# Patient Record
Sex: Male | Born: 1935
Health system: Southern US, Community
[De-identification: ages and names within clinical notes are randomized; demographics above are authoritative.]

## PROBLEM LIST (undated history)

## (undated) DIAGNOSIS — N400 Enlarged prostate without lower urinary tract symptoms: Secondary | ICD-10-CM

## (undated) DIAGNOSIS — F064 Anxiety disorder due to known physiological condition: Secondary | ICD-10-CM

## (undated) DIAGNOSIS — K219 Gastro-esophageal reflux disease without esophagitis: Secondary | ICD-10-CM

## (undated) DIAGNOSIS — E7800 Pure hypercholesterolemia, unspecified: Secondary | ICD-10-CM

## (undated) DIAGNOSIS — I10 Essential (primary) hypertension: Secondary | ICD-10-CM

## (undated) DIAGNOSIS — I209 Angina pectoris, unspecified: Secondary | ICD-10-CM

## (undated) DIAGNOSIS — M1990 Unspecified osteoarthritis, unspecified site: Secondary | ICD-10-CM

## (undated) DIAGNOSIS — F4321 Adjustment disorder with depressed mood: Secondary | ICD-10-CM

## (undated) DIAGNOSIS — Z634 Disappearance and death of family member: Secondary | ICD-10-CM

---

## 2010-02-22 ENCOUNTER — Emergency Department: Payer: Self-pay | Admitting: Emergency Medicine

## 2012-11-02 ENCOUNTER — Emergency Department: Payer: Self-pay | Admitting: Emergency Medicine

## 2012-11-02 LAB — URINALYSIS, COMPLETE
Blood: NEGATIVE
Glucose,UR: NEGATIVE mg/dL (ref 0–75)
Leukocyte Esterase: NEGATIVE
Nitrite: NEGATIVE
Ph: 5 (ref 4.5–8.0)
Squamous Epithelial: <1

## 2012-11-02 LAB — COMPREHENSIVE METABOLIC PANEL
Albumin: 4.2 g/dL (ref 3.4–5.0)
Alkaline Phosphatase: 86 U/L (ref 50–136)
Bilirubin,Total: 0.7 mg/dL (ref 0.2–1.0)
Calcium, Total: 9.4 mg/dL (ref 8.5–10.1)
Chloride: 102 mmol/L (ref 98–107)
Creatinine: 0.74 mg/dL (ref 0.60–1.30)
Glucose: 102 mg/dL — ABNORMAL HIGH (ref 65–99)
Osmolality: 273 (ref 275–301)
SGOT(AST): 26 U/L (ref 15–37)
SGPT (ALT): 24 U/L (ref 12–78)
Total Protein: 7.2 g/dL (ref 6.4–8.2)

## 2012-11-02 LAB — DRUG SCREEN, URINE
Barbiturates, Ur Screen: NEGATIVE (ref ?–200)
Cannabinoid 50 Ng, Ur ~~LOC~~: NEGATIVE (ref ?–50)
Cocaine Metabolite,Ur ~~LOC~~: NEGATIVE (ref ?–300)
MDMA (Ecstasy)Ur Screen: NEGATIVE (ref ?–500)
Opiate, Ur Screen: NEGATIVE (ref ?–300)

## 2012-11-02 LAB — TSH: Thyroid Stimulating Horm: 2.2 u[IU]/mL

## 2012-11-02 LAB — CBC
MCH: 33.1 pg (ref 26.0–34.0)
MCHC: 35.9 g/dL (ref 32.0–36.0)
Platelet: 269 10*3/uL (ref 150–440)
WBC: 7.9 10*3/uL (ref 3.8–10.6)

## 2012-11-02 LAB — ETHANOL: Ethanol: <3 mg/dL

## 2014-05-16 DIAGNOSIS — I10 Essential (primary) hypertension: Secondary | ICD-10-CM | POA: Diagnosis not present

## 2014-05-16 DIAGNOSIS — G4700 Insomnia, unspecified: Secondary | ICD-10-CM | POA: Diagnosis not present

## 2014-05-16 DIAGNOSIS — E785 Hyperlipidemia, unspecified: Secondary | ICD-10-CM | POA: Diagnosis not present

## 2014-05-16 DIAGNOSIS — K219 Gastro-esophageal reflux disease without esophagitis: Secondary | ICD-10-CM | POA: Diagnosis not present

## 2014-05-16 DIAGNOSIS — N401 Enlarged prostate with lower urinary tract symptoms: Secondary | ICD-10-CM | POA: Diagnosis not present

## 2014-06-20 DIAGNOSIS — Z8679 Personal history of other diseases of the circulatory system: Secondary | ICD-10-CM | POA: Diagnosis not present

## 2014-06-20 DIAGNOSIS — K219 Gastro-esophageal reflux disease without esophagitis: Secondary | ICD-10-CM | POA: Diagnosis not present

## 2014-06-20 DIAGNOSIS — N401 Enlarged prostate with lower urinary tract symptoms: Secondary | ICD-10-CM | POA: Diagnosis not present

## 2014-06-20 DIAGNOSIS — I10 Essential (primary) hypertension: Secondary | ICD-10-CM | POA: Diagnosis not present

## 2014-06-20 DIAGNOSIS — I519 Heart disease, unspecified: Secondary | ICD-10-CM | POA: Diagnosis not present

## 2014-06-23 NOTE — Consult Note (Signed)
PATIENT NAME:  Erik Palmer, Erik Palmer MR#:  [id number] DATE OF BIRTH:  27-Dec-1934  DATE OF CONSULTATION:  11/02/2012  PSYCHIATRY CONSULT  REQUESTING PHYSICIAN:  Lowella Fairy, MD CONSULTING PHYSICIAN:  Ardeen Fillers. Garnetta Buddy, MD  REASON FOR CONSULT:  Medication evaluation.  HISTORY OF PRESENT ILLNESS:  The patient is a 80 year old white male who presented to the ER accompanied by his wife as he was complaining of having a feeling in his ear which was bothering him. He told the nurse that he felt suicidal and if anything felt like this, he is not going to live anymore. The patient was evaluated in the room and his friend was sitting next to him. He reported that he had a procedure done in his ear, as his ears were full and the ear canal was not working properly.  He reported that he received a steroid shot in his ear canal in late July. He reported that since then he started having fullness in his ear, as well as in the upper part of his nose. He cannot feel comfortable. The patient reported that his right ear is plugged up.  He cannot sleep and was feeling very restless. He reported that because of the ear discomfort when he came to the ER this morning, he was thinking about getting a CT scan to rule out any other medical issues. However, when the nurse was talking to him, he just mentioned briefly that he does not want to live like this anymore. He reported that he does not mean that he is going to hurt himself. He reported that he has been having a good relationship with his wife, as well as with his friend. He reported that he spent most of the time at home doing some gardening, reading, as well as hunting turkeys. His friend who was sitting next to him reported that he enjoys life to the fullest. The patient reported that he has been married for the past 52 years and has been living with his wife. He reported that he does not feel depressed or anxious. However, he has some sadness related to the death of his son who  passed away 2 years ago after he went into septic shock by eating raw vegetables. The patient reported that [place] patient [place] living and there are things around the house which reminds of his deceased son. However, he is trying to deal with it in his own way but is not having any thoughts to hurt himself or his family.   PAST PSYCHIATRIC HISTORY:  The patient reported that he does not have any psychiatric illness and has never seen a psychiatrist in the past. He reported that he feels fine and is not taking any psychotropic medication. He only takes Ambien as needed to help him with sleep.   PAST MEDICAL HISTORY: The patient has been diagnosed with hypertension, GERD, BPH, hypercholesterolemia, arthritis.  CURRENT HOME MEDICATIONS:  1.  Metoprolol 50 mg 1/2 pill twice daily. 2.  Omeprazole 20 mg daily. 3.  Tamsulosin 0.4 mg once a day. 4.  Fluticasone nasal spray once daily. 5.  Atorvastatin 80 mg once daily.  6.  Aspirin low-dose 81 mg once daily. 7.  Tylenol arthritis 650 mg 2 times a day as needed. 8.  Percocet 1 to 2 tabs every 6 hours p.r.n. as needed for pain. 9.  Ambien 10 mg as needed for sleep. 10.  Hydrochlorothiazide 25 mg 1 tablet as needed  in the morning.   ALLERGIES:  No known drug allergies.   SOCIAL HISTORY:  The patient currently is married and lives with his wife. He has 3 children and 1 is currently deceased. 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  He stated that he [facility] least once per year. However, he feels like that they are  doing a good job and he does not want to switch his current providers.  ANCILLARY DATA: Pulse 75, respirations 20, blood pressure 186/89.  LABORATORY DATA:  Glucose 102, BUN 15, creatinine 0.74, sodium 136, potassium 3.2, chloride 102, bicarbonate 26, anion gap 8, osmolality 273. Blood alcohol  level less than 3, protein 7.2, albumin 4.2, bilirubin 0.7, alkaline phosphatase 86, AST 26, ALT 24. Urine drug screen is negative. WBC 7.9, RBC 4.13, hemoglobin 13.7, hematocrit 38.1, platelet count 269 .  REVIEW OF SYSTEMS:  Currently denied having any weight loss.  EYES:  No double or blurred vision.  ENT:  Complaining of fullness in the right ear. GASTROINTESTINAL:  No abdominal pain, nausea, vomiting.  GENITOURINARY:  No incontinence or frequency.  MUSCULOSKELETAL:  No muscle or joint pain.  MENTAL STATUS:  The patient is a moderately-built male who was lying in the bed. He appeared calm, pleasant and cooperative. Speech was normal in tone and volume. Mood was fine. Affect was congruent. Thought process was logical, goal-directed. Thought content was nondelusional. He currently denied having any suicidal or homicidal ideations or plans. He demonstrated fair insight and judgment. No memory deficits noted at this time and appeared age-related.   DIAGNOSTIC IMPRESSION: AXIS I:  Depressive disorder, not otherwise specified.   AXIS II:  None.   AXIS III:  Please review the medical history.   TREATMENT PLAN: 1.  I discussed with patient at length about the medications, treatment, risks, benefits and alternatives.  2.  He will continue following up [facility] and no new medications will be prescribed at this time. I discussed this case with Dr. Margarita GrizzleWoodruff and he agreed with the plan that the patient can be discharged safely from the hospital at this time. The patient denied having any suicidal thoughts.  Thank you for allowing me to participate in the care of this patient.    ____________________________ Ardeen FillersUzma S. Garnetta BuddyFaheem, MD usf:ce D: 11/02/2012 15:12:04 ET T: 11/02/2012 15:50:36 ET JOB#: [id number]  cc: Ardeen FillersUzma S. Garnetta BuddyFaheem, MD, <Dictator> Rhunette CroftUZMA S Thomasine Klutts MD ELECTRONICALLY SIGNED 11/04/2012 13:55

## 2014-08-22 ENCOUNTER — Other Ambulatory Visit: Payer: Self-pay | Admitting: Family Medicine

## 2014-08-30 DIAGNOSIS — F329 Major depressive disorder, single episode, unspecified: Secondary | ICD-10-CM | POA: Diagnosis not present

## 2014-08-30 DIAGNOSIS — I10 Essential (primary) hypertension: Secondary | ICD-10-CM | POA: Diagnosis not present

## 2014-08-30 DIAGNOSIS — R531 Weakness: Secondary | ICD-10-CM | POA: Diagnosis not present

## 2014-08-31 DIAGNOSIS — F419 Anxiety disorder, unspecified: Secondary | ICD-10-CM | POA: Diagnosis not present

## 2014-08-31 DIAGNOSIS — F329 Major depressive disorder, single episode, unspecified: Secondary | ICD-10-CM | POA: Diagnosis not present

## 2014-09-01 ENCOUNTER — Telehealth: Payer: Self-pay | Admitting: Family Medicine

## 2014-09-01 NOTE — Telephone Encounter (Signed)
Wife says she has Prozac from 2 years ago. I advised not to take as it is expired and it is not good to take expired meds. She will have him stop Celexa for now and may call and schedule appt later.

## 2014-09-01 NOTE — Telephone Encounter (Signed)
He can stop the Celexa if he wants, or he can try 1/2 tablet to start.  i cannot give him Prozac again unless I see him and I will not be able to see him for 2 weeks.  He had not taken Prozac as he was supposed to in the past according to my notes.  He could see Amy next week if he wanted.-jh

## 2014-09-01 NOTE — Telephone Encounter (Signed)
Pt. Wife called states that pt urgent  Care  (I think 08/2914) was given Celexa have taken only 3 pills, pills can not sleep,eat wanted  To know if he should stop this pill, or take  Prozac for depression pt call back # is [phone](h), [phone](c).

## 2014-09-02 ENCOUNTER — Encounter: Payer: Self-pay | Admitting: Emergency Medicine

## 2014-09-02 ENCOUNTER — Inpatient Hospital Stay
Admission: EM | Admit: 2014-09-02 | Discharge: 2014-09-04 | DRG: 641 | Disposition: A | Discharge status: Home / Self Care | Payer: Medicare Other | Attending: Specialist | Admitting: Specialist

## 2014-09-02 ENCOUNTER — Emergency Department: Payer: Medicare Other

## 2014-09-02 DIAGNOSIS — M1990 Unspecified osteoarthritis, unspecified site: Secondary | ICD-10-CM | POA: Diagnosis present

## 2014-09-02 DIAGNOSIS — Z79899 Other long term (current) drug therapy: Secondary | ICD-10-CM | POA: Diagnosis not present

## 2014-09-02 DIAGNOSIS — F419 Anxiety disorder, unspecified: Secondary | ICD-10-CM | POA: Diagnosis not present

## 2014-09-02 DIAGNOSIS — I209 Angina pectoris, unspecified: Secondary | ICD-10-CM | POA: Diagnosis not present

## 2014-09-02 DIAGNOSIS — E785 Hyperlipidemia, unspecified: Secondary | ICD-10-CM | POA: Diagnosis not present

## 2014-09-02 DIAGNOSIS — F064 Anxiety disorder due to known physiological condition: Secondary | ICD-10-CM | POA: Diagnosis present

## 2014-09-02 DIAGNOSIS — E871 Hypo-osmolality and hyponatremia: Principal | ICD-10-CM | POA: Diagnosis present

## 2014-09-02 DIAGNOSIS — E780 Pure hypercholesterolemia: Secondary | ICD-10-CM | POA: Diagnosis present

## 2014-09-02 DIAGNOSIS — N400 Enlarged prostate without lower urinary tract symptoms: Secondary | ICD-10-CM | POA: Diagnosis not present

## 2014-09-02 DIAGNOSIS — I10 Essential (primary) hypertension: Secondary | ICD-10-CM | POA: Diagnosis present

## 2014-09-02 DIAGNOSIS — J984 Other disorders of lung: Secondary | ICD-10-CM | POA: Diagnosis not present

## 2014-09-02 DIAGNOSIS — Z7982 Long term (current) use of aspirin: Secondary | ICD-10-CM | POA: Diagnosis not present

## 2014-09-02 DIAGNOSIS — Z634 Disappearance and death of family member: Secondary | ICD-10-CM | POA: Diagnosis present

## 2014-09-02 DIAGNOSIS — G4700 Insomnia, unspecified: Secondary | ICD-10-CM | POA: Diagnosis present

## 2014-09-02 DIAGNOSIS — K219 Gastro-esophageal reflux disease without esophagitis: Secondary | ICD-10-CM | POA: Diagnosis present

## 2014-09-02 DIAGNOSIS — R110 Nausea: Secondary | ICD-10-CM | POA: Diagnosis not present

## 2014-09-02 DIAGNOSIS — R51 Headache: Secondary | ICD-10-CM | POA: Diagnosis present

## 2014-09-02 DIAGNOSIS — F4321 Adjustment disorder with depressed mood: Secondary | ICD-10-CM | POA: Diagnosis present

## 2014-09-02 HISTORY — DX: Benign prostatic hyperplasia without lower urinary tract symptoms: N40.0

## 2014-09-02 HISTORY — DX: Adjustment disorder with depressed mood: F43.21

## 2014-09-02 HISTORY — DX: Disappearance and death of family member: Z63.4

## 2014-09-02 HISTORY — DX: Essential (primary) hypertension: I10

## 2014-09-02 HISTORY — DX: Gastro-esophageal reflux disease without esophagitis: K21.9

## 2014-09-02 HISTORY — DX: Unspecified osteoarthritis, unspecified site: M19.90

## 2014-09-02 HISTORY — DX: Pure hypercholesterolemia, unspecified: E78.00

## 2014-09-02 HISTORY — DX: Angina pectoris, unspecified: I20.9

## 2014-09-02 HISTORY — DX: Anxiety disorder due to known physiological condition: F06.4

## 2014-09-02 LAB — CBC
HCT: 39.4 % — ABNORMAL LOW (ref 40.0–52.0)
HEMOGLOBIN: 14.2 g/dL (ref 13.0–18.0)
MCH: 32.8 pg (ref 26.0–34.0)
MCHC: 36.1 g/dL — ABNORMAL HIGH (ref 32.0–36.0)
MCV: 90.9 fL (ref 80.0–100.0)
Platelets: 315 10*3/uL (ref 150–440)
RBC: 4.33 MIL/uL — AB (ref 4.40–5.90)
RDW: 12.9 % (ref 11.5–14.5)
WBC: 10.6 10*3/uL (ref 3.8–10.6)

## 2014-09-02 LAB — BASIC METABOLIC PANEL
Anion gap: 13 (ref 5–15)
BUN: 8 mg/dL (ref 6–20)
CHLORIDE: 87 mmol/L — AB (ref 101–111)
CO2: 21 mmol/L — ABNORMAL LOW (ref 22–32)
CREATININE: 0.58 mg/dL — AB (ref 0.61–1.24)
Calcium: 8.8 mg/dL — ABNORMAL LOW (ref 8.9–10.3)
GFR calc Af Amer: >60 mL/min (ref >60)
GLUCOSE: 118 mg/dL — AB (ref 65–99)
POTASSIUM: 3.3 mmol/L — AB (ref 3.5–5.1)
Sodium: 121 mmol/L — ABNORMAL LOW (ref 135–145)

## 2014-09-02 LAB — TSH: TSH: 1.509 u[IU]/mL (ref 0.350–4.500)

## 2014-09-02 LAB — HEPATIC FUNCTION PANEL
ALK PHOS: 69 U/L (ref 38–126)
ALT: 20 U/L (ref 17–63)
AST: 25 U/L (ref 15–41)
Albumin: 4.5 g/dL (ref 3.5–5.0)
BILIRUBIN DIRECT: 0.1 mg/dL (ref 0.1–0.5)
BILIRUBIN INDIRECT: 1 mg/dL — AB (ref 0.3–0.9)
TOTAL PROTEIN: 6.9 g/dL (ref 6.5–8.1)
Total Bilirubin: 1.1 mg/dL (ref 0.3–1.2)

## 2014-09-02 LAB — TROPONIN I: Troponin I: <0.03 ng/mL (ref <0.031)

## 2014-09-02 MED ORDER — ACETAMINOPHEN 325 MG PO TABS
650.0000 mg | ORAL_TABLET | Freq: Four times a day (QID) | ORAL | Status: DC | PRN
  Administered 2014-09-02 – 2014-09-03 (×2): 650 mg via ORAL
  Filled 2014-09-02 (×2): qty 2

## 2014-09-02 MED ORDER — ATORVASTATIN CALCIUM 20 MG PO TABS
40.0000 mg | ORAL_TABLET | ORAL | Status: DC
  Administered 2014-09-03 – 2014-09-04 (×2): 40 mg via ORAL
  Filled 2014-09-02 (×2): qty 2

## 2014-09-02 MED ORDER — ACETAMINOPHEN 650 MG RE SUPP: 650.0000 mg | Freq: Four times a day (QID) | RECTAL | Status: DC | PRN

## 2014-09-02 MED ORDER — ONDANSETRON HCL 4 MG PO TABS: 4.0000 mg | ORAL_TABLET | Freq: Four times a day (QID) | ORAL | Status: DC | PRN

## 2014-09-02 MED ORDER — ONDANSETRON HCL 4 MG/2ML IJ SOLN: 4.0000 mg | Freq: Four times a day (QID) | INTRAMUSCULAR | Status: DC | PRN

## 2014-09-02 MED ORDER — FLUTICASONE PROPIONATE 50 MCG/ACT NA SUSP
1.0000 | Freq: Every day | NASAL | Status: DC
  Administered 2014-09-03 – 2014-09-04 (×2): 1 via NASAL
  Filled 2014-09-02: qty 16

## 2014-09-02 MED ORDER — HEPARIN SODIUM (PORCINE) 5000 UNIT/ML IJ SOLN
5000.0000 [IU] | Freq: Three times a day (TID) | INTRAMUSCULAR | Status: DC
  Filled 2014-09-02 (×3): qty 1

## 2014-09-02 MED ORDER — TAMSULOSIN HCL 0.4 MG PO CAPS
0.4000 mg | ORAL_CAPSULE | Freq: Every evening | ORAL | Status: DC
  Administered 2014-09-03: 0.4 mg via ORAL
  Filled 2014-09-02: qty 1

## 2014-09-02 MED ORDER — ZOLPIDEM TARTRATE 5 MG PO TABS
5.0000 mg | ORAL_TABLET | Freq: Every evening | ORAL | Status: DC | PRN
  Administered 2014-09-02 – 2014-09-03 (×2): 5 mg via ORAL
  Filled 2014-09-02 (×2): qty 1

## 2014-09-02 MED ORDER — ASPIRIN EC 81 MG PO TBEC
81.0000 mg | DELAYED_RELEASE_TABLET | ORAL | Status: DC
  Administered 2014-09-03 – 2014-09-04 (×2): 81 mg via ORAL
  Filled 2014-09-02 (×2): qty 1

## 2014-09-02 MED ORDER — METOPROLOL TARTRATE 25 MG PO TABS
25.0000 mg | ORAL_TABLET | Freq: Two times a day (BID) | ORAL | Status: DC
  Administered 2014-09-02 – 2014-09-04 (×3): 25 mg via ORAL
  Filled 2014-09-02 (×4): qty 1

## 2014-09-02 MED ORDER — LORAZEPAM 0.5 MG PO TABS
ORAL_TABLET | ORAL | Status: AC
  Administered 2014-09-02: 0.5 mg via ORAL
  Filled 2014-09-02: qty 1

## 2014-09-02 MED ORDER — MORPHINE SULFATE 2 MG/ML IJ SOLN
2.0000 mg | INTRAMUSCULAR | Status: DC | PRN
  Administered 2014-09-03: 2 mg via INTRAVENOUS
  Filled 2014-09-02: qty 1

## 2014-09-02 MED ORDER — PANTOPRAZOLE SODIUM 40 MG PO TBEC
40.0000 mg | DELAYED_RELEASE_TABLET | Freq: Every day | ORAL | Status: DC
  Administered 2014-09-03 – 2014-09-04 (×2): 40 mg via ORAL
  Filled 2014-09-02 (×2): qty 1

## 2014-09-02 MED ORDER — LORAZEPAM 0.5 MG PO TABS
0.5000 mg | ORAL_TABLET | Freq: Once | ORAL | Status: AC
  Administered 2014-09-02: 0.5 mg via ORAL

## 2014-09-02 MED ORDER — SODIUM CHLORIDE 0.9 % IV BOLUS (SEPSIS)
1000.0000 mL | Freq: Once | INTRAVENOUS | Status: AC
  Administered 2014-09-02: 1000 mL via INTRAVENOUS

## 2014-09-02 MED ORDER — SODIUM CHLORIDE 0.9 % IV SOLN
INTRAVENOUS | Status: DC
  Administered 2014-09-02 – 2014-09-04 (×4): via INTRAVENOUS

## 2014-09-02 NOTE — H&P (Signed)
[facility] [place] [facility]   PATIENT NAME: Erik Palmer    MR#:  [id number]  DATE OF BIRTH:  [birth year]   DATE OF ADMISSION:  09/02/2014  PRIMARY CARE PHYSICIAN: Fidel Levy, MD   REQUESTING/REFERRING PHYSICIAN: Shaune Pollack  CHIEF COMPLAINT:   Chief Complaint  Patient presents with  . Headache    Pt presents to ER alert and in NAD. Pt reports he has had headache, abd pain, anxiety, nasuea, insomnia for 2 weeks.   . Abdominal Pain  . Anxiety    HISTORY OF PRESENT ILLNESS:  Erik Palmer  is a 80 y.o. male with a known history of essential hypertension, gastroesophageal reflux disease without esophagitis presenting with anxiety. History aided from family members present at bedside. Patient states approximately 7 days ago began feeling anxious as well as insomnia, decreased by mouth intake condition worsened for the last 3 days. He did attempt to take some Celexa at home without any improvement and self discontinued this medication. [name] presents to Hospital further workup and evaluation. Emergency room course: Noted to have markedly low sodium levels 121  PAST MEDICAL HISTORY:   Past Medical History  Diagnosis Date  . Hypertension   . Prostate enlargement   . GERD (gastroesophageal reflux disease)   . Angina pectoris syndrome   . Hypercholesteremia   . Arthritis     PAST SURGICAL HISTORY:  History reviewed. No pertinent past surgical history.  SOCIAL HISTORY:   History  Substance Use Topics  . Smoking status: Never Smoker   . Smokeless tobacco: Not on file  . Alcohol Use: No    FAMILY HISTORY:   Family History  Problem Relation Age of Onset  . Diabetes Neg Hx     DRUG ALLERGIES:  No Known Allergies  REVIEW OF SYSTEMS:  REVIEW OF SYSTEMS:  CONSTITUTIONAL: Denies fevers, chills, fatigue, weakness.  EYES: Denies blurred vision, double vision, or eye pain.  EARS, NOSE, THROAT: Denies tinnitus, ear pain, hearing loss.   RESPIRATORY: denies cough, shortness of breath, wheezing  CARDIOVASCULAR: Denies chest pain, palpitations, edema.  GASTROINTESTINAL: Positive nausea, denies vomiting, diarrhea, abdominal pain.  GENITOURINARY: Denies dysuria, hematuria.  ENDOCRINE: Denies nocturia or thyroid problems. HEMATOLOGIC AND LYMPHATIC: Denies easy bruising or bleeding.  SKIN: Denies rash or lesions.  MUSCULOSKELETAL: Denies pain in neck, back, shoulder, knees, hips, or further arthritic symptoms.  NEUROLOGIC: Denies paralysis, paresthesias.  PSYCHIATRIC: Positive anxiety or denies depressive symptoms. Otherwise full review of systems performed by me is negative.   MEDICATIONS AT HOME:   Prior to Admission medications   Medication Sig Start Date End Date Taking? Authorizing Provider  acetaminophen (TYLENOL) 650 MG CR tablet Take 650 mg by mouth daily.   Yes Historical Provider, MD  aspirin EC 81 MG tablet Take 81 mg by mouth every morning.   Yes Historical Provider, MD  atorvastatin (LIPITOR) 80 MG tablet Take 40 mg by mouth every morning. 08/18/14  Yes Historical Provider, MD  fluticasone (FLONASE) 50 MCG/ACT nasal spray INHALE 2 SPRAYS INTO EACH NOSTRIL EVERY DAY 08/22/14  Yes Janeann Forehand., MD  hydrochlorothiazide (HYDRODIURIL) 25 MG tablet Take 12.5 mg by mouth daily. 08/18/14  Yes Historical Provider, MD  metoprolol (LOPRESSOR) 50 MG tablet Take 25 mg by mouth 2 (two) times daily. 08/09/14  Yes Historical Provider, MD  omeprazole (PRILOSEC) 20 MG capsule Take 20 mg by mouth every morning. 08/18/14  Yes Historical Provider, MD  tamsulosin (FLOMAX) 0.4 MG CAPS capsule Take  0.4 mg by mouth every evening. 08/28/14  Yes Historical Provider, MD      VITAL SIGNS:  Blood pressure 129/61, pulse 76, temperature 97.9 F (36.6 C), temperature source Oral, resp. rate 18, height  (1.753 m), weight 218 lb (98.884 kg), SpO2 97 %.  PHYSICAL EXAMINATION:  VITAL SIGNS: Filed Vitals:   09/02/14 2228  BP: 129/61   Pulse: 76  Temp:   Resp: 18   GENERAL:79 y.o.male currently in no acute distress.  HEAD: Normocephalic, atraumatic.  EYES: Pupils equal, round, reactive to light. Extraocular muscles intact. No scleral icterus.  MOUTH: Dry mucosal membrane. Dentition intact. No abscess noted.  EAR, NOSE, THROAT: Clear without exudates. No external lesions.  NECK: Supple. No thyromegaly. No nodules. No JVD.  PULMONARY: Clear to ascultation, without wheeze rails or rhonci. No use of accessory muscles, Good respiratory effort. good air entry bilaterally CHEST: Nontender to palpation.  CARDIOVASCULAR: S1 and S2. Regular rate and rhythm. No murmurs, rubs, or gallops. No edema. Pedal pulses 2+ bilaterally.  GASTROINTESTINAL: Soft, nontender, nondistended. No masses. Positive bowel sounds. No hepatosplenomegaly.  MUSCULOSKELETAL: No swelling, clubbing, or edema. Range of motion full in all extremities.  NEUROLOGIC: Cranial nerves II through XII are intact. No gross focal neurological deficits. Sensation intact. Reflexes intact.  SKIN: No ulceration, lesions, rashes, or cyanosis. Skin warm and dry. Turgor intact.  PSYCHIATRIC: Mood, affect within normal limits. The patient is awake, alert and oriented x 3. Insight, judgment intact.    LABORATORY PANEL:   CBC  Recent Labs Lab 09/02/14 1923  WBC 10.6  HGB 14.2  HCT 39.4*  PLT 315   ------------------------------------------------------------------------------------------------------------------  Chemistries   Recent Labs Lab 09/02/14 1923  NA 121*  K 3.3*  CL 87*  CO2 21*  GLUCOSE 118*  BUN 8  CREATININE 0.58*  CALCIUM 8.8*  AST 25  ALT 20  ALKPHOS 69  BILITOT 1.1   ------------------------------------------------------------------------------------------------------------------  Cardiac Enzymes  Recent Labs Lab 09/02/14 1923  TROPONINI <0.03    ------------------------------------------------------------------------------------------------------------------  RADIOLOGY:  Dg Abd Acute W/chest  09/02/2014   CLINICAL DATA:  Weakness, several weeks duration. Insomnia. Appetite loss. Headache and nausea.  EXAM: DG ABDOMEN ACUTE W/ 1V CHEST  COMPARISON:  None.  FINDINGS: Bowel gas pattern is normal without evidence of ileus, obstruction or free air. Fecal volume is within normal limits. No abnormal calcifications or significant bony findings.  One-view chest shows normal heart size. There is atherosclerosis of the aorta. There is some pleural scarring bilaterally. There are increased interstitial markings in the lower lungs, probably scarring. No sign of infiltrate, mass, collapse or effusion.  IMPRESSION: Negative for acute findings. No pathologic finding in the abdomen. Pleural and parenchymal scarring in the chest.   Electronically Signed   By: Paulina Fusi M.D.   On: 09/02/2014 20:55    EKG:   Orders placed or performed during the hospital encounter of 09/02/14  . ED EKG  . ED EKG  . EKG 12-Lead  . EKG 12-Lead    IMPRESSION AND PLAN:   80 year old Caucasian gentleman history of essential hypertension, GERD without esophagitis presenting with anxiety symptoms noted to have low sodium.  1. Hyponatremia: Patient describes poor by mouth intake as well as on a diuretic, sodium deficit 938.6 to correct to sodium 140, to correct sodium to 131-494 mEq deficit, provide IV fluid hydration normal saline 100 mL per hour follow sodium levels, hold diuretics, check TSH  2. Anxiety not otherwise specified: Consult psychiatry 3. Essential hypertension:  Hold hydrochlorothiazide 4. GERD without esophagitis: PPI therapy 5. Venous thrombus embolism prophylactic: Heparin subcutaneous    All the records are reviewed and case discussed with ED provider. Management plans discussed with the patient, family and they are in agreement.  CODE STATUS:  Full  TOTAL TIME TAKING CARE OF THIS PATIENT: 45 minutes.    Antolin Belsito,  Mardi MainlandDavid K M.D on 09/02/2014 at 10:42 PM  Between 7am to 6pm - Pager - [phone]  After 6pm: House Pager: - [phone]  [facility]  Office  [phone]  CC: Primary care physician; Fidel LevyJames Hawkins Jr, MD

## 2014-09-02 NOTE — ED Provider Notes (Signed)
[facility] Emergency Department Provider Note   ____________________________________________  Time seen: 7:45 PM I have reviewed the triage vital signs and the triage nursing note.  HISTORY  Chief Complaint Headache; Abdominal Pain; and Anxiety   Historian Patient and spouse  HPI Erik Palmer is a 80 y.o. male who is here stating "I don't feel good." He and his wife agree that he hasn't felt well for several weeks. He went to the beach about 2 weeks ago and didn't feel well there and seemed to be getting worse in terms of daily headaches, anxiety, nausea, and decreased appetite. Since he's been home he's been feeling weakness all over, anxious and jittery, racing thoughts, and having insomnia. He is continued have a decrease in appetite and some nausea with mid abdominal pain. His wife states he had an episode like this which was diagnosed as anxiety several years ago. At that point in time his primary care physician did treat him with Prozac and Valium. He apparently only took a couple of days worth of the Prozac. Several days ago during this episode his family member suggested that she used Celexa, and so they went to the urgent care and the doctor there prescribed 10 mg of Celexa daily. The patient tried this on Wednesday, Thursday, and Friday and felt no better and so the patient stopped it yesterday.No chest pain, no cough, no trouble breathing, no fevers. Headache is generalized and comes and goes, and is located globally. No vision changes. He has had tick bites but no skin rashes over the past weeks to months.    Past Medical History  Diagnosis Date  . Hypertension   . Prostate enlargement   . GERD (gastroesophageal reflux disease)   . Angina pectoris syndrome   . Hypercholesteremia   . Arthritis     There are no active problems to display for this patient.   No past surgical history on file.  Current Outpatient Rx  Name  Route  Sig  Dispense   Refill  . acetaminophen (TYLENOL) 650 MG CR tablet   Oral   Take 650 mg by mouth daily.         [name]. aspirin EC 81 MG tablet   Oral   Take 81 mg by mouth every morning.         [name]. atorvastatin (LIPITOR) 80 MG tablet   Oral   Take 40 mg by mouth every morning.      12   . fluticasone (FLONASE) 50 MCG/ACT nasal spray      INHALE 2 SPRAYS INTO EACH NOSTRIL EVERY DAY   16 g   12   . hydrochlorothiazide (HYDRODIURIL) 25 MG tablet   Oral   Take 12.5 mg by mouth daily.      12   . metoprolol (LOPRESSOR) 50 MG tablet   Oral   Take 25 mg by mouth 2 (two) times daily.      12   . omeprazole (PRILOSEC) 20 MG capsule   Oral   Take 20 mg by mouth every morning.      12   . tamsulosin (FLOMAX) 0.4 MG CAPS capsule   Oral   Take 0.4 mg by mouth every evening.      12     Allergies Review of patient's allergies indicates no known allergies.  No family history on file.  Social History History  Substance Use Topics  . Smoking status: Never Smoker   . Smokeless tobacco: Not on  file  . Alcohol Use: No    Review of Systems  Constitutional: Negative for fever. Eyes: Negative for visual changes. ENT: Negative for sore throat. Positive for clear runny nose for couple days.  Cardiovascular: Negative for chest pain. Respiratory: Negative for shortness of breath. No coughing Gastrointestinal: Negative for  vomiting and diarrhea. Genitourinary: Negative for dysuria. Musculoskeletal: Negative for back pain. Skin: Negative for rash. Neurological: Negative for  focal weakness or numbness. 10 point Review of Systems otherwise negative ____________________________________________   PHYSICAL EXAM:  VITAL SIGNS: ED Triage Vitals  Enc Vitals Group     BP 09/02/14 1913 184/71 mmHg     Pulse Rate 09/02/14 1913 64     Resp 09/02/14 1913 18     Temp 09/02/14 1913 97.9 F (36.6 C)     Temp Source 09/02/14 1913 Oral     SpO2 09/02/14 1913 97 %     Weight 09/02/14 1910  218 lb (98.884 kg)     Height 09/02/14 1910  (1.753 m)     Head Cir --      Peak Flow --      Pain Score 09/02/14 1910 9     Pain Loc --      Pain Edu? --      Excl. in GC? --      Constitutional: Alert and oriented. He keeps eyes closed most of the history. No acute distress. Eyes: Conjunctivae are normal. PERRL. Normal extraocular movements. ENT   Head: Normocephalic and atraumatic. Cheeks are somewhat flushed.   Nose: No congestion. Small clear rhinorrhea   Mouth/Throat: Mucous membranes are moist.   Neck: No stridor. Cardiovascular/Chest: Normal rate, regular rhythm.  No murmurs, rubs, or gallops. Respiratory: Normal respiratory effort without tachypnea nor retractions. Breath sounds are clear and equal bilaterally. No wheezes/rales/rhonchi. Gastrointestinal: Soft. No distention, no guarding, no rebound. Mild epigastric discomfort on palpation.  Genitourinary/rectal:Deferred Musculoskeletal: Nontender with normal range of motion in all extremities. No joint effusions.  No lower extremity tenderness nor edema. Neurologic:  Normal speech and language. No gross focal neurologic deficits are appreciated. Skin:  Skin is warm, dry and intact. No rash noted. Psychiatric: Mood and affect are normal. Speech and behavior are normal. Patient exhibits appropriate insight and judgment. Patient is somewhat anxious.  ____________________________________________   EKG I, Governor Rooks, MD, the attending physician have personally viewed and interpreted all ECGs.  62 bpm. Normal sinus rhythm. Narrow QRS. Normal axis. Normal ST and T-wave. ____________________________________________  LABS (pertinent positives/negatives)  CBC within normal limits Sodium 121, potassium 3.3, chloride 87, CO2 21, glucose 118 Troponin less than 0.03 LFTs within normal limits except for indirect bili 1.0 Urinalysis pending ____________________________________________  RADIOLOGY All Xrays were  viewed by me. Imaging interpreted by Radiologist.  Abdomen completely chest: Negative __________________________________________  PROCEDURES  Procedure(s) performed: None Critical Care performed: None  ____________________________________________   ED COURSE / ASSESSMENT AND PLAN  CONSULTATIONS: Face-to-face discussion with hospitalist for admission  Pertinent labs & imaging results that were available during my care of the patient were reviewed by me and considered in my medical decision making (see chart for details).   Multiple generalized symptoms initiated generalized medical workup. Sodium came back at 121, when previous value has been in the normal range.. Patient receiving normal saline bolus. He is symptomatic with generalized weakness, nausea and headache. He will be admitted for evaluation and treatment of his symptomatically hyponatremia.  The onset of this overall illness in terms of the anxiety  component seems to have been going on for several weeks now, however the severe weakness along with the headache and nausea seems to have been worse over the past several days. Although the wife does not think that he had ticks on him, the patient does report removing some ticks the past several weeks.  There is been no fever, nor any skin rash. Do not see direct indication for treatment of tickborne illness, however we'll discuss this possibly with the hospitalist.   Patient / Family / Caregiver informed of clinical course, medical decision-making process, and agree with plan.   I discussed return precautions, follow-up instructions, and discharged instructions with patient and/or family.  ___________________________________________   FINAL CLINICAL IMPRESSION(S) / ED DIAGNOSES   Final diagnoses:  Hyponatremia      Governor Rooks, MD 09/02/14 2104

## 2014-09-02 NOTE — ED Notes (Signed)
Pt presents to ER alert and in NAD. Pt reports he has had headache, abd pain, anxiety, nasuea, insomnia for 2 weeks.

## 2014-09-03 ENCOUNTER — Encounter: Payer: Self-pay | Admitting: Psychiatry

## 2014-09-03 DIAGNOSIS — Z634 Disappearance and death of family member: Secondary | ICD-10-CM

## 2014-09-03 DIAGNOSIS — F064 Anxiety disorder due to known physiological condition: Secondary | ICD-10-CM

## 2014-09-03 DIAGNOSIS — F4321 Adjustment disorder with depressed mood: Secondary | ICD-10-CM

## 2014-09-03 HISTORY — DX: Adjustment disorder with depressed mood: F43.21

## 2014-09-03 HISTORY — DX: Anxiety disorder due to known physiological condition: F06.4

## 2014-09-03 LAB — BASIC METABOLIC PANEL
Anion gap: 7 (ref 5–15)
BUN: 8 mg/dL (ref 6–20)
CALCIUM: 8.1 mg/dL — AB (ref 8.9–10.3)
CHLORIDE: 91 mmol/L — AB (ref 101–111)
CO2: 26 mmol/L (ref 22–32)
CREATININE: 0.61 mg/dL (ref 0.61–1.24)
GFR calc Af Amer: >60 mL/min (ref >60)
GLUCOSE: 110 mg/dL — AB (ref 65–99)
POTASSIUM: 3.1 mmol/L — AB (ref 3.5–5.1)
SODIUM: 124 mmol/L — AB (ref 135–145)

## 2014-09-03 LAB — URINALYSIS COMPLETE WITH MICROSCOPIC (ARMC ONLY)
BILIRUBIN URINE: NEGATIVE
Bacteria, UA: NONE SEEN
Glucose, UA: NEGATIVE mg/dL
Hgb urine dipstick: NEGATIVE
Leukocytes, UA: NEGATIVE
NITRITE: NEGATIVE
PROTEIN: NEGATIVE mg/dL
SPECIFIC GRAVITY, URINE: 1.012 (ref 1.005–1.030)
SQUAMOUS EPITHELIAL / LPF: NONE SEEN
pH: 6 (ref 5.0–8.0)

## 2014-09-03 LAB — OSMOLALITY: Osmolality: 262 mOsm/kg — ABNORMAL LOW (ref 275–295)

## 2014-09-03 LAB — OSMOLALITY, URINE: Osmolality, Ur: 363 mOsm/kg (ref 300–900)

## 2014-09-03 LAB — MAGNESIUM: MAGNESIUM: 1.9 mg/dL (ref 1.7–2.4)

## 2014-09-03 MED ORDER — POTASSIUM CHLORIDE CRYS ER 20 MEQ PO TBCR
20.0000 meq | EXTENDED_RELEASE_TABLET | Freq: Two times a day (BID) | ORAL | Status: DC
  Administered 2014-09-03 – 2014-09-04 (×3): 20 meq via ORAL
  Filled 2014-09-03 (×3): qty 1

## 2014-09-03 MED ORDER — ALPRAZOLAM 0.25 MG PO TABS
0.2500 mg | ORAL_TABLET | Freq: Three times a day (TID) | ORAL | Status: DC | PRN
  Administered 2014-09-03 – 2014-09-04 (×3): 0.25 mg via ORAL
  Filled 2014-09-03 (×3): qty 1

## 2014-09-03 NOTE — Progress Notes (Signed)
Pt admitted to [facility] from the ED.  Alert and oriented however pt can be forgetful and has some anxiety. VSS. Tylenol given once for headache. No n/v noted.  Wife at  Bedside.  Voiding without difficulty.  Resting quietly at this time after [place] given last night.  Will cont to monitor.

## 2014-09-03 NOTE — Progress Notes (Signed)
[facility] - [place] at [facility]   PATIENT NAME: Erik Palmer    MR#:  [id number]  DATE OF BIRTH:  March 17, 1934  SUBJECTIVE:  CHIEF COMPLAINT:   Chief Complaint  Patient presents with  . Headache    Pt presents to ER alert and in NAD. Pt reports he has had headache, abd pain, anxiety, nasuea, insomnia for 2 weeks.   . Abdominal Pain  . Anxiety   Pt. Lying in bed in NAD.  NO complaints.  Sodium improved.   REVIEW OF SYSTEMS:    Review of Systems  Constitutional: Negative for fever and chills.  HENT: Negative for congestion and tinnitus.   Eyes: Negative for blurred vision and double vision.  Respiratory: Negative for cough, shortness of breath and wheezing.   Cardiovascular: Negative for chest pain, orthopnea and PND.  Gastrointestinal: Negative for nausea, vomiting, abdominal pain and diarrhea.  Genitourinary: Negative for dysuria and hematuria.  Skin: Negative for rash.  Neurological: Negative for dizziness, sensory change, focal weakness and weakness.  All other systems reviewed and are negative.   Nutrition: Heart Healthy Tolerating Diet: Yes  DRUG ALLERGIES:  No Known Allergies  VITALS:  Blood pressure 159/73, pulse 71, temperature 98.6 F (37 C), temperature source Oral, resp. rate 18, height  (1.753 m), weight 98.884 kg (218 lb), SpO2 98 %.  PHYSICAL EXAMINATION:   Physical Exam  GENERAL:  80 y.o.-year-old patient lying in the bed with no acute distress.  EYES: Pupils equal, round, reactive to light and accommodation. No scleral icterus. Extraocular muscles intact.  HEENT: Head atraumatic, normocephalic. Oropharynx and nasopharynx clear.  NECK:  Supple, no jugular venous distention. No thyroid enlargement, no tenderness.  LUNGS: Normal breath sounds bilaterally, no wheezing, rales, rhonchi. No use of accessory muscles of respiration.  CARDIOVASCULAR: S1, S2 RRR. No murmurs, rubs, or gallops.  ABDOMEN: Soft, nontender,  nondistended. Bowel sounds present. No organomegaly or mass.  EXTREMITIES: No cyanosis, clubbing or edema b/l.    NEUROLOGIC: Cranial nerves II through XII are intact. No focal Motor or sensory deficits b/l.   PSYCHIATRIC: The patient is alert and oriented x 3. Good Affect.  SKIN: No obvious rash, lesion, or ulcer.    LABORATORY PANEL:   CBC  Recent Labs Lab 09/02/14 1923  WBC 10.6  HGB 14.2  HCT 39.4*  PLT 315   ------------------------------------------------------------------------------------------------------------------  Chemistries   Recent Labs Lab 09/02/14 1923 09/03/14 0519  NA 121* 124*  K 3.3* 3.1*  CL 87* 91*  CO2 21* 26  GLUCOSE 118* 110*  BUN 8 8  CREATININE 0.58* 0.61  CALCIUM 8.8* 8.1*  AST 25  --   ALT 20  --   ALKPHOS 69  --   BILITOT 1.1  --    ------------------------------------------------------------------------------------------------------------------  Cardiac Enzymes  Recent Labs Lab 09/02/14 1923  TROPONINI <0.03   ------------------------------------------------------------------------------------------------------------------  RADIOLOGY:  Dg Abd Acute W/chest  09/02/2014   CLINICAL DATA:  Weakness, several weeks duration. Insomnia. Appetite loss. Headache and nausea.  EXAM: DG ABDOMEN ACUTE W/ 1V CHEST  COMPARISON:  None.  FINDINGS: Bowel gas pattern is normal without evidence of ileus, obstruction or free air. Fecal volume is within normal limits. No abnormal calcifications or significant bony findings.  One-view chest shows normal heart size. There is atherosclerosis of the aorta. There is some pleural scarring bilaterally. There are increased interstitial markings in the lower lungs, probably scarring. No sign of infiltrate, mass, collapse or effusion.  IMPRESSION: Negative for acute  findings. No pathologic finding in the abdomen. Pleural and parenchymal scarring in the chest.   Electronically Signed   By: Paulina FusiMark  Shogry M.D.   On:  09/02/2014 20:55     ASSESSMENT AND PLAN:   80 year old male with past medical history of hyperlipidemia, anxiety, history of headaches who presented to the hospital due to some insomnia and feeling anxious and having poor by mouth intake over the past few days and noted to be hyponatremic.  #1 hyponatremia-this is likely hypovolemic hyponatremia due to poor by mouth intake. -Continue gentle IV hydration and follow sodium. TSH is normal. -We'll check a serum and urine osmolality. Hold HCTZ.  #2 anxiety-continue as needed Xanax. -Patient was seen by psychiatry and an no added no new recommendations and patient does not need any acute inpatient psychiatric care.  #3 hyperlipidemia-continue atorvastatin.  #4 hypertension-continue metoprolol. Hold HCTZ given hyponatremia.  #5 BPH-continue Flomax.  All the records are reviewed and case discussed with Care Management/Social Workerr. Management plans discussed with the patient, family and they are in agreement.  CODE STATUS: Full  DVT Prophylaxis: Heparin subcutaneous  TOTAL TIME TAKING CARE OF THIS PATIENT: 30 minutes.   POSSIBLE D/C IN 1-2 DAYS, DEPENDING ON CLINICAL CONDITION.   Houston SirenSAINANI,VIVEK J M.D on 09/03/2014 at 11:59 AM  Between 7am to 6pm - Pager - [phone]  After 6pm go to [email or web address] - password [email or web address] [facility]  [facility]  Office  [phone]  CC: Primary care physician; Fidel LevyJames Hawkins Jr, MD

## 2014-09-03 NOTE — Consult Note (Addendum)
[facility] Consult   Reason for Consult:  Anxiety, insomnia and question of discontinuing Celexa Referring Physician:  Lytle Butte M.D  Patient Identification: Erik Palmer MRN:  [id number] Principal Diagnosis: Anxiety disorder due to general medical condition   Diagnosis:   Patient Active Problem List   Diagnosis Date Noted  . Grief at loss of child [F43.21] 09/03/2014  . Anxiety disorder due to general medical condition [F06.4] 09/03/2014  . Hyponatremia [E87.1] 09/02/2014    Total Time spent with patient: 1 hour  Subjective:   Erik Palmer is a 80 y.o. male patient admitted with hyponatremia of 121 on 09-02-14. He has been more restless over the past several days and it likely started in the past 2 years with the unexpected death of his son on Sep 23, 2012.   I spoke with his wife and the pt. Pt is guarded but his wife provides a great deal of history while the pt is sleeping.   She noted pt was recently given Xanax about 10-15 minute ago and is sleeping. She noted about 2 years ago, he has some issues with anxiety, he was prescribed Prozac & Ativan. He took 2 Prozac tablets and he felt better then stopped the medications. Several months later, anxiety returned after the going to the beach. He tried Prozac again x2 and this was not helpful and the pt stopped the medication.   Pt was prescribed Celexa this past week and he took 3 of those, last Wednesday, Thursday ad Friday of last week. It was determined that it was ok to stop the Celexa as it was not helpful. It was stopped on Friday with a doctor's approval. He then came into the hospital last night. His wife notes that he gets agitated and anxious but not depressed. His wife also shared if he is not able to sleep, then he worries frequently. He has issues sleeping and normally naps during the day. His wife notes has never had a sleep study.   The pt described his anxiety as restlessness. He feels he is  always on the go and does not like to remain still. He denies symptoms consistent with ADHD-IT (he did well in school but noted he was wound up by a motor as a child). He denies depressive symptoms and OCD. He endorses feeling restless constantly. He notes he feels restless and keyed up or on edge frequently.   Pt and his wife shared their son died unexpectedly on 23-Sep-2012 due to sepsis. This was 5 days after the pt's birthday. Pt did not participate in psychotherapy or grief counseling at that time. He feels in the past his mood declined based upon this.   Pt's wife denies SI, HI and AVH. He has not participated in psychotherapy. Patient has weapons at home.  Past Psych Hz:  No hospitalization Celexa and Prozac No psychiatrist or therapist.   FHx:  Patient denies psychiatric FHx. Denies h/o suicide in the family.   SHx: Married.  Lost 1 son on 09-23-12 due to sepsis and lost the same son's wife in March 2014.  Pt did not participate in grief counseling at this time.  The pt and his wife are caring for their granddaughter who is the child of their deceased son.  Retired Energy manager. Retired in 2005.   PMHx: Active Ambulatory Problems    Diagnosis Date Noted  . No Active Ambulatory Problems   Resolved Ambulatory Problems  Diagnosis Date Noted  . No Resolved Ambulatory Problems   Past Medical History  Diagnosis Date  . Hypertension   . Prostate enlargement   . GERD (gastroesophageal reflux disease)   . Angina pectoris syndrome   . Hypercholesteremia   . Arthritis     HPI Elements:   Location:  various places. Quality:  moderate. Severity:  severe. Timing:  past 18-24 hours. Duration:  past 2 years. Context:  in context of grief and hyponatremia.  Past Medical History:  Past Medical History  Diagnosis Date  . Hypertension   . Prostate enlargement   . GERD (gastroesophageal reflux disease)   . Angina pectoris syndrome   . Hypercholesteremia    . Arthritis    History reviewed. No pertinent past surgical history. Family History:  Family History  Problem Relation Age of Onset  . Diabetes Neg Hx    Social History:  History  Alcohol Use No     History  Drug Use Not on file    History   Social History  . Marital Status: Married    Spouse Name: N/A  . Number of Children: N/A  . Years of Education: N/A   Social History Main Topics  . Smoking status: Never Smoker   . Smokeless tobacco: Not on file  . Alcohol Use: No  . Drug Use: Not on file  . Sexual Activity: Not on file   Other Topics Concern  . None   Social History Narrative  . None   Additional Social History:                          Allergies:  No Known Allergies  Labs:  Results for orders placed or performed during the hospital encounter of 09/02/14 (from the past 48 hour(s))  CBC     Status: Abnormal   Collection Time: 09/02/14  7:23 PM  Result Value Ref Range   WBC 10.6 3.8 - 10.6 K/uL   RBC 4.33 (L) 4.40 - 5.90 MIL/uL   Hemoglobin 14.2 13.0 - 18.0 g/dL    Comment: RESULT REPEATED AND VERIFIED   HCT 39.4 (L) 40.0 - 52.0 %    Comment: RESULT REPEATED AND VERIFIED   MCV 90.9 80.0 - 100.0 fL   MCH 32.8 26.0 - 34.0 pg   MCHC 36.1 (H) 32.0 - 36.0 g/dL   RDW 12.9 11.5 - 14.5 %   Platelets 315 150 - 440 K/uL  Basic metabolic panel     Status: Abnormal   Collection Time: 09/02/14  7:23 PM  Result Value Ref Range   Sodium 121 (L) 135 - 145 mmol/L   Potassium 3.3 (L) 3.5 - 5.1 mmol/L   Chloride 87 (L) 101 - 111 mmol/L   CO2 21 (L) 22 - 32 mmol/L   Glucose, Bld 118 (H) 65 - 99 mg/dL   BUN 8 6 - 20 mg/dL   Creatinine, Ser 0.58 (L) 0.61 - 1.24 mg/dL   Calcium 8.8 (L) 8.9 - 10.3 mg/dL   GFR calc non Af Amer >60 >60 mL/min   GFR calc Af Amer >60 >60 mL/min    Comment: (NOTE) The eGFR has been calculated using the CKD EPI equation. This calculation has not been validated in all clinical situations. eGFR's persistently <60 mL/min signify  possible Chronic Kidney Disease.    Anion gap 13 5 - 15  Troponin I     Status: None   Collection Time: 09/02/14  7:23 PM  Result Value Ref Range   Troponin I <0.03 <0.031 ng/mL    Comment:        NO INDICATION OF MYOCARDIAL INJURY.   Hepatic function panel     Status: Abnormal   Collection Time: 09/02/14  7:23 PM  Result Value Ref Range   Total Protein 6.9 6.5 - 8.1 g/dL   Albumin 4.5 3.5 - 5.0 g/dL   AST 25 15 - 41 U/L   ALT 20 17 - 63 U/L   Alkaline Phosphatase 69 38 - 126 U/L   Total Bilirubin 1.1 0.3 - 1.2 mg/dL   Bilirubin, Direct 0.1 0.1 - 0.5 mg/dL   Indirect Bilirubin 1.0 (H) 0.3 - 0.9 mg/dL  TSH     Status: None   Collection Time: 09/02/14  7:23 PM  Result Value Ref Range   TSH 1.509 0.350 - 4.500 uIU/mL  Urinalysis complete, with microscopic     Status: Abnormal   Collection Time: 09/02/14 10:50 PM  Result Value Ref Range   Color, Urine YELLOW (A) YELLOW   APPearance CLEAR (A) CLEAR   Glucose, UA NEGATIVE NEGATIVE mg/dL   Bilirubin Urine NEGATIVE NEGATIVE   Ketones, ur 1+ (A) NEGATIVE mg/dL   Specific Gravity, Urine 1.012 1.005 - 1.030   Hgb urine dipstick NEGATIVE NEGATIVE   pH 6.0 5.0 - 8.0   Protein, ur NEGATIVE NEGATIVE mg/dL   Nitrite NEGATIVE NEGATIVE   Leukocytes, UA NEGATIVE NEGATIVE   RBC / HPF 0-5 0 - 5 RBC/hpf   WBC, UA 0-5 0 - 5 WBC/hpf   Bacteria, UA NONE SEEN NONE SEEN   Squamous Epithelial / LPF NONE SEEN NONE SEEN   Mucous PRESENT   Basic metabolic panel     Status: Abnormal   Collection Time: 09/03/14  5:19 AM  Result Value Ref Range   Sodium 124 (L) 135 - 145 mmol/L   Potassium 3.1 (L) 3.5 - 5.1 mmol/L   Chloride 91 (L) 101 - 111 mmol/L   CO2 26 22 - 32 mmol/L   Glucose, Bld 110 (H) 65 - 99 mg/dL   BUN 8 6 - 20 mg/dL   Creatinine, Ser 0.61 0.61 - 1.24 mg/dL   Calcium 8.1 (L) 8.9 - 10.3 mg/dL   GFR calc non Af Amer >60 >60 mL/min   GFR calc Af Amer >60 >60 mL/min    Comment: (NOTE) The eGFR has been calculated using the CKD EPI  equation. This calculation has not been validated in all clinical situations. eGFR's persistently <60 mL/min signify possible Chronic Kidney Disease.    Anion gap 7 5 - 15    Vitals: Blood pressure 159/73, pulse 71, temperature 98.6 F (37 C), temperature source Oral, resp. rate 18, height 5' 9" (1.753 m), weight 98.884 kg (218 lb), SpO2 98 %.  Risk to Self: Is patient at risk for suicide?: No Risk to Others:   Prior Inpatient Therapy:   Prior Outpatient Therapy:    Current Facility-Administered Medications  Medication Dose Route Frequency Provider Last Rate Last Dose  . 0.9 %  sodium chloride infusion   Intravenous Continuous Lytle Butte, MD 100 mL/hr at 09/03/14 0817    . acetaminophen (TYLENOL) tablet 650 mg  650 mg Oral Q6H PRN Lytle Butte, MD   650 mg at 09/03/14 0092   Or  . acetaminophen (TYLENOL) suppository 650 mg  650 mg Rectal Q6H PRN Lytle Butte, MD      . ALPRAZolam Duanne Moron) tablet 0.25 mg  0.25  mg Oral TID PRN Henreitta Leber, MD   0.25 mg at 09/03/14 0813  . aspirin EC tablet 81 mg  81 mg Oral BH-q7a Lytle Butte, MD   81 mg at 09/03/14 0813  . atorvastatin (LIPITOR) tablet 40 mg  40 mg Oral BH-q7a Lytle Butte, MD   40 mg at 09/03/14 8250  . fluticasone (FLONASE) 50 MCG/ACT nasal spray 1 spray  1 spray Each Nare Daily Lytle Butte, MD   1 spray at 09/03/14 0818  . heparin injection 5,000 Units  5,000 Units Subcutaneous 3 times per day Lytle Butte, MD   5,000 Units at 09/02/14 2330  . metoprolol tartrate (LOPRESSOR) tablet 25 mg  25 mg Oral BID Lytle Butte, MD   25 mg at 09/03/14 0813  . morphine 2 MG/ML injection 2 mg  2 mg Intravenous Q4H PRN Lytle Butte, MD      . ondansetron [facility]) tablet 4 mg  4 mg Oral Q6H PRN Lytle Butte, MD       Or  . ondansetron [facility]) injection 4 mg  4 mg Intravenous Q6H PRN Lytle Butte, MD      . pantoprazole (PROTONIX) EC tablet 40 mg  40 mg Oral Daily Lytle Butte, MD   40 mg at 09/03/14 0818  . tamsulosin (FLOMAX)  capsule 0.4 mg  0.4 mg Oral QPM Lytle Butte, MD      . zolpidem [facility]) tablet 5 mg  5 mg Oral QHS PRN Lytle Butte, MD   5 mg at 09/02/14 2330    Musculoskeletal: Strength & Muscle Tone: within normal limits Gait & Station: not assessed Patient leans: N/A  Psychiatric Specialty Exam: Physical Exam  ROS  Blood pressure 159/73, pulse 71, temperature 98.6 F (37 C), temperature source Oral, resp. rate 18, height 5' 9" (1.753 m), weight 98.884 kg (218 lb), SpO2 98 %.Body mass index is 32.18 kg/(m^2).  General Appearance: Guarded  Eye Contact::  Fair  Speech:  Clear and Coherent and Normal Rate  Volume:  Normal  Mood:  Dysphoric  Affect:  Blunt  Thought Process:  Coherent, Goal Directed, Intact, Linear and Logical  Orientation:  Full (Time, Place, and Person)  Thought Content:  Negative  Suicidal Thoughts:  No  Homicidal Thoughts:  No  Memory:  Immediate;   Good Recent;   Good Remote;   Good  Judgement:  Good  Insight:  Good  Psychomotor Activity:  Normal  Concentration:  Good  Recall:  Good  Fund of Knowledge:Good  Language: Good  Akathisia:  Negative  Handed:  Right  AIMS (if indicated):     Assets:  Communication Skills Desire for Improvement Financial Resources/Insurance Housing Intimacy Resilience Social Support Talents/Skills Transportation Vocational/Educational  ADL's:  Intact  Cognition: WNL  Sleep:      Medical Decision Making: Established Problem, Stable/Improving (1), Review of Psycho-Social Stressors (1), Review or order clinical lab tests (1), Review or order medicine tests (1) and Review of Medication Regimen & Side Effects (2)  Treatment Plan Summary: Medication management   Reedy Biernat Bal is a 80 y.o. male with hyponatremia who is experiencing increased anxiety and restlessness along with the death of his on son on 17-Oct-2012 due to sepsis. His son died unexpectedly. The pt did not participate in psychotherapy or grief counseling in the  past.  Due to hyponatremia, I would recommend discontinuing his antidepressant as it can cause hyponatremia. This is the unlikely  source of hyponatremia as he was not taking the SSRI regularly. Pt denies SI, HI and AVH.   Plan:  No evidence of imminent risk to self or others at present.   Patient does not meet criteria for psychiatric inpatient admission. Supportive therapy provided about ongoing stressors. Discussed crisis plan, support from social network, calling 911, coming to the Emergency Department, and calling Suicide Hotline.   1. Please provide pt resources for psychotherapy, geriatric psychiatry and grief counseling.   Disposition: Home once medically stable.   Donita Brooks, M.D. Attending Physician [place] 09/03/2014 9:01 AM

## 2014-09-03 NOTE — Progress Notes (Signed)
MD notified of pt. C/o sever anxiety. Pt states he feels like "the walls are closing in". Obtained telephone order for xanax. Will give and continue to monitor.

## 2014-09-04 LAB — BASIC METABOLIC PANEL
ANION GAP: 3 — AB (ref 5–15)
BUN: 8 mg/dL (ref 6–20)
CO2: 27 mmol/L (ref 22–32)
Calcium: 8.5 mg/dL — ABNORMAL LOW (ref 8.9–10.3)
Chloride: 103 mmol/L (ref 101–111)
Creatinine, Ser: 0.6 mg/dL — ABNORMAL LOW (ref 0.61–1.24)
Glucose, Bld: 103 mg/dL — ABNORMAL HIGH (ref 65–99)
POTASSIUM: 3.6 mmol/L (ref 3.5–5.1)
Sodium: 133 mmol/L — ABNORMAL LOW (ref 135–145)

## 2014-09-04 MED ORDER — ALPRAZOLAM 0.25 MG PO TABS: 0.2500 mg | ORAL_TABLET | Freq: Three times a day (TID) | ORAL | Status: DC | PRN

## 2014-09-04 MED ORDER — ZOLPIDEM TARTRATE 5 MG PO TABS: 5.0000 mg | ORAL_TABLET | Freq: Every evening | ORAL | Status: DC | PRN

## 2014-09-04 NOTE — Discharge Summary (Signed)
[facility] - [place] at [facility]   PATIENT NAME: Erik Palmer    MR#:  [id number]  DATE OF BIRTH:  1934-05-28  DATE OF ADMISSION:  09/02/2014 ADMITTING PHYSICIAN: Wyatt Haste, MD  DATE OF DISCHARGE: 09/04/2014  9:57 AM  PRIMARY CARE PHYSICIAN: Fidel Levy, MD    ADMISSION DIAGNOSIS:  Hyponatremia [E87.1]  DISCHARGE DIAGNOSIS:  Principal Problem:   Hyponatremia Active Problems:   Grief at loss of child   Anxiety disorder due to general medical condition   SECONDARY DIAGNOSIS:   Past Medical History  Diagnosis Date  . Hypertension   . Prostate enlargement   . GERD (gastroesophageal reflux disease)   . Angina pectoris syndrome   . Hypercholesteremia   . Arthritis   . Grief at loss of child 09/03/2014  . 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    HOSPITAL COURSE:   80 year old male with past medical history of hypertension BPH, GERD, hyperlipidemia, anxiety who presented to the hospital due to insomnia and incidentally noted to be hyponatremic.  #1 hyponatremia-this is likely hypovolemic hyponatremia due to poor by mouth intake. -Patient was hydrated with IV fluids and his sodium is improved and up to 133 on the day of discharge. -Clinically patient is asymptomatic and doing well and therefore being discharged home. -Patient has been taken off his HCTZ  #2 anxiety/insomnia-patient was seen by psychiatry and he did not recommend any inpatient psychiatric care. -Patient was given some Xanax for anxiety and placed on some Ambien and has clinically improved and therefore being discharged on those meds.  #3 hyperlipidemia-continue atorvastatin.  #4 hypertension-continue metoprolol. DC HCTZ due to hyponatremia  #5 BPH-continue Flomax.   DISCHARGE CONDITIONS:   Stable  CONSULTS OBTAINED:  Treatment Team:  Wyatt Haste, MD  DRUG ALLERGIES:  No Known Allergies  DISCHARGE MEDICATIONS:   Discharge Medication  List as of 09/04/2014  9:26 AM    START taking these medications   Details  ALPRAZolam (XANAX) 0.25 MG tablet Take 1 tablet (0.25 mg total) by mouth 3 (three) times daily as needed for anxiety., Starting 09/04/2014, Until Discontinued, Print    zolpidem (AMBIEN) 5 MG tablet Take 1 tablet (5 mg total) by mouth at bedtime as needed for sleep., Starting 09/04/2014, Until Discontinued, Print      CONTINUE these medications which have NOT CHANGED   Details  acetaminophen (TYLENOL) 650 MG CR tablet Take 650 mg by mouth daily., Until Discontinued, Historical Med    aspirin EC 81 MG tablet Take 81 mg by mouth every morning., Until Discontinued, Historical Med    atorvastatin (LIPITOR) 80 MG tablet Take 40 mg by mouth every morning., Starting 08/18/2014, Until Discontinued, Historical Med    fluticasone (FLONASE) 50 MCG/ACT nasal spray INHALE 2 SPRAYS INTO EACH NOSTRIL EVERY DAY, Normal    metoprolol (LOPRESSOR) 50 MG tablet Take 25 mg by mouth 2 (two) times daily., Starting 08/09/2014, Until Discontinued, Historical Med    omeprazole (PRILOSEC) 20 MG capsule Take 20 mg by mouth every morning., Starting 08/18/2014, Until Discontinued, Historical Med    tamsulosin (FLOMAX) 0.4 MG CAPS capsule Take 0.4 mg by mouth every evening., Starting 08/28/2014, Until Discontinued, Historical Med      STOP taking these medications     hydrochlorothiazide (HYDRODIURIL) 25 MG tablet          DISCHARGE INSTRUCTIONS:   DIET:  Cardiac diet  DISCHARGE CONDITION:  Stable  ACTIVITY:  Activity as tolerated  OXYGEN:  Home Oxygen: No.   Oxygen Delivery: room air  DISCHARGE LOCATION:  home   If you experience worsening of your admission symptoms, develop shortness of breath, life threatening emergency, suicidal or homicidal thoughts you must seek medical attention immediately by calling 911 or calling your MD immediately  if symptoms less severe.  You Must read complete instructions/literature along with  all the possible adverse reactions/side effects for all the Medicines you take and that have been prescribed to you. Take any new Medicines after you have completely understood and accpet all the possible adverse reactions/side effects.   Please note  You were cared for by a hospitalist during your hospital stay. If you have any questions about your discharge medications or the care you received while you were in the hospital after you are discharged, you can call the unit and asked to speak with the hospitalist on call if the hospitalist that took care of you is not available. Once you are discharged, your primary care physician will handle any further medical issues. Please note that NO REFILLS for any discharge medications will be authorized once you are discharged, as it is imperative that you return to your primary care physician (or establish a relationship with a primary care physician if you do not have one) for your aftercare needs so that they can reassess your need for medications and monitor your lab values.     Today   Feels better, denies any acute complaints. Slept well last night.  Wife at bedside  VITAL SIGNS:  Blood pressure 147/77, pulse 65, temperature 97.9 F (36.6 C), temperature source Oral, resp. rate 18, height  (1.753 m), weight 98.884 kg (218 lb), SpO2 96 %.  I/O:   Intake/Output Summary (Last 24 hours) at 09/04/14 1213 Last data filed at 09/04/14 0500  Gross per 24 hour  Intake   2093 ml  Output   2150 ml  Net    -57 ml    PHYSICAL EXAMINATION:  GENERAL:  80 y.o.-year-old patient lying in the bed with no acute distress.  EYES: Pupils equal, round, reactive to light and accommodation. No scleral icterus. Extraocular muscles intact.  HEENT: Head atraumatic, normocephalic. Oropharynx and nasopharynx clear.  NECK:  Supple, no jugular venous distention. No thyroid enlargement, no tenderness.  LUNGS: Normal breath sounds bilaterally, no wheezing,  rales,rhonchi. No use of accessory muscles of respiration.  CARDIOVASCULAR: S1, S2 RRR. No murmurs, rubs, or gallops.  ABDOMEN: Soft, non-tender, non-distended. Bowel sounds present. No organomegaly or mass.  EXTREMITIES: No pedal edema, cyanosis, or clubbing.  NEUROLOGIC: Cranial nerves II through XII are intact. No focal motor or sensory defecits b/l.  PSYCHIATRIC: The patient is alert and oriented x 3. Good affect.  SKIN: No obvious rash, lesion, or ulcer.   DATA REVIEW:   CBC  Recent Labs Lab 09/02/14 1923  WBC 10.6  HGB 14.2  HCT 39.4*  PLT 315    Chemistries   Recent Labs Lab 09/02/14 1923  09/03/14 1456 09/04/14 0612  NA 121*  < >  --  133*  K 3.3*  < >  --  3.6  CL 87*  < >  --  103  CO2 21*  < >  --  27  GLUCOSE 118*  < >  --  103*  BUN 8  < >  --  8  CREATININE 0.58*  < >  --  0.60*  CALCIUM 8.8*  < >  --  8.5*  MG  --   --  1.9  --   AST 25  --   --   --   ALT 20  --   --   --   ALKPHOS 69  --   --   --   BILITOT 1.1  --   --   --   < > = values in this interval not displayed.  Cardiac Enzymes  Recent Labs Lab 09/02/14 1923  TROPONINI <0.03    Microbiology Results  No results found for this or any previous visit.  RADIOLOGY:  Dg Abd Acute W/chest  09/02/2014   CLINICAL DATA:  Weakness, several weeks duration. Insomnia. Appetite loss. Headache and nausea.  EXAM: DG ABDOMEN ACUTE W/ 1V CHEST  COMPARISON:  None.  FINDINGS: Bowel gas pattern is normal without evidence of ileus, obstruction or free air. Fecal volume is within normal limits. No abnormal calcifications or significant bony findings.  One-view chest shows normal heart size. There is atherosclerosis of the aorta. There is some pleural scarring bilaterally. There are increased interstitial markings in the lower lungs, probably scarring. No sign of infiltrate, mass, collapse or effusion.  IMPRESSION: Negative for acute findings. No pathologic finding in the abdomen. Pleural and parenchymal  scarring in the chest.   Electronically Signed   By: Paulina FusiMark  Shogry M.D.   On: 09/02/2014 20:55      Management plans discussed with the patient, family and they are in agreement.  CODE STATUS:     Code Status Orders        Start     Ordered   09/02/14 2137  Full code   Continuous     09/02/14 2136      TOTAL TIME TAKING CARE OF THIS PATIENT: 40 minutes.    Houston SirenSAINANI,VIVEK J M.D on 09/04/2014 at 12:13 PM  Between 7am to 6pm - Pager - [phone]  After 6pm go to [email or web address] - password [email or web address] [email or web address]  [facility]  Office  [phone]  CC: Primary care physician; Fidel LevyJames Hawkins Jr, MD

## 2014-09-04 NOTE — Progress Notes (Signed)
A&O. VSS. Tolerating diet well. No complaints of pain or nausea. Resting comfortably. Discharged per MD orders. Discharge instructions reviewed with pt and wife, both verbalized understanding. IV's removed per policy. Prescriptions given to pt. Discharged via wheelchair escorted by nursing staff.

## 2014-09-04 NOTE — Discharge Instructions (Signed)

## 2014-09-12 ENCOUNTER — Encounter: Payer: Self-pay | Admitting: Family Medicine

## 2014-09-12 ENCOUNTER — Ambulatory Visit (INDEPENDENT_AMBULATORY_CARE_PROVIDER_SITE_OTHER): Payer: Self-pay | Admitting: Family Medicine

## 2014-09-12 VITALS — BP 140/70 | HR 62 | Temp 98.1°F | Resp 16 | Ht 69.0 in | Wt 225.4 lb

## 2014-09-12 DIAGNOSIS — F064 Anxiety disorder due to known physiological condition: Secondary | ICD-10-CM

## 2014-09-12 DIAGNOSIS — E871 Hypo-osmolality and hyponatremia: Secondary | ICD-10-CM

## 2014-09-12 DIAGNOSIS — F419 Anxiety disorder, unspecified: Secondary | ICD-10-CM | POA: Insufficient documentation

## 2014-09-12 MED ORDER — NORTRIPTYLINE HCL 10 MG PO CAPS: 10.0000 mg | ORAL_CAPSULE | Freq: Every day | ORAL | Status: DC

## 2014-09-12 NOTE — Patient Instructions (Addendum)
Stop all HCTZ immediatly.  Take 1/2 Ambien, 5 mg at bedtime. For 2-3 weeks then take only every other night.  Take Xanax 0.25, 1 twice a day for 2 weeks then 1 a day for 2 weeks.

## 2014-09-12 NOTE — Progress Notes (Signed)
Name: Erik Palmer   MRN: [id number]    DOB: 1934-06-11   Date:09/12/2014       Progress Note  Subjective  Chief Complaint  Chief Complaint  Patient presents with  . Depression    Hospital follow up low sodium after Urgent Care started Celexa.     HPI  Here for hospital f/u for anxiety, insomnia. Hyponatremia.  Has HBP and chronic anxiety and chronic insomnia.  Feeliog well since being on Ambien 5 mg hs  and Xanax, 0.25 mg bid   Past Medical History  Diagnosis Date  . Hypertension   . Prostate enlargement   . GERD (gastroesophageal reflux disease)   . Angina pectoris syndrome   . Hypercholesteremia   . Arthritis   . Grief at loss of child 09/03/2014  . 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    History reviewed. No pertinent past surgical history.  Family History  Problem Relation Age of Onset  . Diabetes Neg Hx     History   Social History  . Marital Status: Married    Spouse Name: N/A  . Number of Children: N/A  . Years of Education: N/A   Occupational History  . Not on file.   Social History Main Topics  . Smoking status: Never Smoker   . Smokeless tobacco: Never Used  . Alcohol Use: No  . Drug Use: No  . Sexual Activity: Not on file   Other Topics Concern  . Not on file   Social History Narrative     Current outpatient prescriptions:  .  acetaminophen (TYLENOL) 650 MG CR tablet, Take 650 mg by mouth daily., Disp: , Rfl:  .  ALPRAZolam (XANAX) 0.25 MG tablet, Take 1 tablet (0.25 mg total) by mouth 3 (three) times daily as needed for anxiety., Disp: 50 tablet, Rfl: 0 .  aspirin EC 81 MG tablet, Take 81 mg by mouth every morning., Disp: , Rfl:  .  atorvastatin (LIPITOR) 80 MG tablet, Take 40 mg by mouth every morning., Disp: , Rfl: 12 .  fluticasone (FLONASE) 50 MCG/ACT nasal spray, INHALE 2 SPRAYS INTO EACH NOSTRIL EVERY DAY, Disp: 16 g, Rfl: 12 .  metoprolol (LOPRESSOR) 50 MG tablet, Take 25 mg by mouth 2 (two) times daily.,  Disp: , Rfl: 12 .  omeprazole (PRILOSEC) 20 MG capsule, Take 20 mg by mouth every morning., Disp: , Rfl: 12 .  tamsulosin (FLOMAX) 0.4 MG CAPS capsule, Take 0.4 mg by mouth every evening., Disp: , Rfl: 12 .  zolpidem (AMBIEN) 5 MG tablet, Take 1 tablet (5 mg total) by mouth at bedtime as needed for sleep., Disp: 30 tablet, Rfl: 0 .  nortriptyline (PAMELOR) 10 MG capsule, Take 1 capsule (10 mg total) by mouth at bedtime., Disp: 30 capsule, Rfl: 3  Allergies  Allergen Reactions  . Solu-Medrol [Methylprednisolone Acetate] Anxiety     Review of Systems  Constitutional: Negative.  Negative for fever, chills, weight loss and malaise/fatigue.  HENT: Negative.   Eyes: Negative.  Negative for blurred vision and double vision.  Respiratory: Negative.  Negative for cough, shortness of breath and wheezing.   Cardiovascular: Negative.  Negative for chest pain, palpitations, orthopnea, claudication and leg swelling.  Gastrointestinal: Negative.  Negative for abdominal pain and blood in stool.  Genitourinary: Negative.  Negative for dysuria, urgency and frequency.  Musculoskeletal: Negative.  Negative for myalgias and joint pain.  Skin: Negative.  Negative for rash.  Neurological: Negative.  Negative for dizziness, sensory change,  speech change, focal weakness, seizures and headaches.  Psychiatric/Behavioral: The patient is nervous/anxious and has insomnia.       Objective  Filed Vitals:   09/12/14 1315 09/12/14 1357  BP: 143/71 140/70  Pulse: 62   Temp: 98.1 F (36.7 C)   Resp: 16   Height: $Remove'5\' 9"'lPySgPN$  (1.753 m)   Weight: 225 lb 6.4 oz (102.241 kg)     Physical Exam  Constitutional: He is well-developed, well-nourished, and in no distress.  HENT:  Head: Normocephalic and atraumatic.  Eyes: Conjunctivae and EOM are normal. Pupils are equal, round, and reactive to light. No scleral icterus.  Neck: Normal range of motion. Neck supple. No thyromegaly present.  Cardiovascular: Normal rate,  regular rhythm and intact distal pulses.  Exam reveals gallop and S3. Exam reveals no friction rub.   No murmur heard. Pulmonary/Chest: Effort normal and breath sounds normal. No respiratory distress. He has no wheezes. He has no rales. He exhibits no tenderness.  Abdominal: Soft. Bowel sounds are normal. He exhibits no mass. There is no tenderness.  Musculoskeletal: He exhibits no edema.  Lymphadenopathy:    He has no cervical adenopathy.  Vitals reviewed.      Recent Results (from the past 2160 hour(s))  CBC     Status: Abnormal   Collection Time: 09/02/14  7:23 PM  Result Value Ref Range   WBC 10.6 3.8 - 10.6 K/uL   RBC 4.33 (L) 4.40 - 5.90 MIL/uL   Hemoglobin 14.2 13.0 - 18.0 g/dL    Comment: RESULT REPEATED AND VERIFIED   HCT 39.4 (L) 40.0 - 52.0 %    Comment: RESULT REPEATED AND VERIFIED   MCV 90.9 80.0 - 100.0 fL   MCH 32.8 26.0 - 34.0 pg   MCHC 36.1 (H) 32.0 - 36.0 g/dL   RDW 12.9 11.5 - 14.5 %   Platelets 315 150 - 440 K/uL  Basic metabolic panel     Status: Abnormal   Collection Time: 09/02/14  7:23 PM  Result Value Ref Range   Sodium 121 (L) 135 - 145 mmol/L   Potassium 3.3 (L) 3.5 - 5.1 mmol/L   Chloride 87 (L) 101 - 111 mmol/L   CO2 21 (L) 22 - 32 mmol/L   Glucose, Bld 118 (H) 65 - 99 mg/dL   BUN 8 6 - 20 mg/dL   Creatinine, Ser 0.58 (L) 0.61 - 1.24 mg/dL   Calcium 8.8 (L) 8.9 - 10.3 mg/dL   GFR calc non Af Amer >60 >60 mL/min   GFR calc Af Amer >60 >60 mL/min    Comment: (NOTE) The eGFR has been calculated using the CKD EPI equation. This calculation has not been validated in all clinical situations. eGFR's persistently <60 mL/min signify possible Chronic Kidney Disease.    Anion gap 13 5 - 15  Troponin I     Status: None   Collection Time: 09/02/14  7:23 PM  Result Value Ref Range   Troponin I <0.03 <0.031 ng/mL    Comment:        NO INDICATION OF MYOCARDIAL INJURY.   Hepatic function panel     Status: Abnormal   Collection Time: 09/02/14  7:23  PM  Result Value Ref Range   Total Protein 6.9 6.5 - 8.1 g/dL   Albumin 4.5 3.5 - 5.0 g/dL   AST 25 15 - 41 U/L   ALT 20 17 - 63 U/L   Alkaline Phosphatase 69 38 - 126 U/L   Total Bilirubin 1.1  0.3 - 1.2 mg/dL   Bilirubin, Direct 0.1 0.1 - 0.5 mg/dL   Indirect Bilirubin 1.0 (H) 0.3 - 0.9 mg/dL  TSH     Status: None   Collection Time: 09/02/14  7:23 PM  Result Value Ref Range   TSH 1.509 0.350 - 4.500 uIU/mL  Urinalysis complete, with microscopic     Status: Abnormal   Collection Time: 09/02/14 10:50 PM  Result Value Ref Range   Color, Urine YELLOW (A) YELLOW   APPearance CLEAR (A) CLEAR   Glucose, UA NEGATIVE NEGATIVE mg/dL   Bilirubin Urine NEGATIVE NEGATIVE   Ketones, ur 1+ (A) NEGATIVE mg/dL   Specific Gravity, Urine 1.012 1.005 - 1.030   Hgb urine dipstick NEGATIVE NEGATIVE   pH 6.0 5.0 - 8.0   Protein, ur NEGATIVE NEGATIVE mg/dL   Nitrite NEGATIVE NEGATIVE   Leukocytes, UA NEGATIVE NEGATIVE   RBC / HPF 0-5 0 - 5 RBC/hpf   WBC, UA 0-5 0 - 5 WBC/hpf   Bacteria, UA NONE SEEN NONE SEEN   Squamous Epithelial / LPF NONE SEEN NONE SEEN   Mucous PRESENT   Osmolality, urine     Status: None   Collection Time: 09/02/14 10:50 PM  Result Value Ref Range   Osmolality, Ur 363 300 - 900 mOsm/kg  Basic metabolic panel     Status: Abnormal   Collection Time: 09/03/14  5:19 AM  Result Value Ref Range   Sodium 124 (L) 135 - 145 mmol/L   Potassium 3.1 (L) 3.5 - 5.1 mmol/L   Chloride 91 (L) 101 - 111 mmol/L   CO2 26 22 - 32 mmol/L   Glucose, Bld 110 (H) 65 - 99 mg/dL   BUN 8 6 - 20 mg/dL   Creatinine, Ser 0.61 0.61 - 1.24 mg/dL   Calcium 8.1 (L) 8.9 - 10.3 mg/dL   GFR calc non Af Amer >60 >60 mL/min   GFR calc Af Amer >60 >60 mL/min    Comment: (NOTE) The eGFR has been calculated using the CKD EPI equation. This calculation has not been validated in all clinical situations. eGFR's persistently <60 mL/min signify possible Chronic Kidney Disease.    Anion gap 7 5 - 15   Osmolality     Status: Abnormal   Collection Time: 09/03/14  2:56 PM  Result Value Ref Range   Osmolality 262 (L) 275 - 295 mOsm/kg  Magnesium     Status: None   Collection Time: 09/03/14  2:56 PM  Result Value Ref Range   Magnesium 1.9 1.7 - 2.4 mg/dL  Basic metabolic panel     Status: Abnormal   Collection Time: 09/04/14  6:12 AM  Result Value Ref Range   Sodium 133 (L) 135 - 145 mmol/L   Potassium 3.6 3.5 - 5.1 mmol/L   Chloride 103 101 - 111 mmol/L   CO2 27 22 - 32 mmol/L   Glucose, Bld 103 (H) 65 - 99 mg/dL   BUN 8 6 - 20 mg/dL   Creatinine, Ser 0.60 (L) 0.61 - 1.24 mg/dL   Calcium 8.5 (L) 8.9 - 10.3 mg/dL   GFR calc non Af Amer >60 >60 mL/min   GFR calc Af Amer >60 >60 mL/min    Comment: (NOTE) The eGFR has been calculated using the CKD EPI equation. This calculation has not been validated in all clinical situations. eGFR's persistently <60 mL/min signify possible Chronic Kidney Disease.    Anion gap 3 (L) 5 - 15     Assessment &  Plan  Problem List Items Addressed This Visit      Other   Hyponatremia   Relevant Orders   Basic Metabolic Panel (BMET)   Anxiety disorder due to general medical condition   Relevant Medications   nortriptyline (PAMELOR) 10 MG capsule   Acute anxiety - Primary   Relevant Medications   nortriptyline (PAMELOR) 10 MG capsule      Meds ordered this encounter  Medications  . DISCONTD: citalopram (CELEXA) 10 MG tablet    Sig:   . nortriptyline (PAMELOR) 10 MG capsule    Sig: Take 1 capsule (10 mg total) by mouth at bedtime.    Dispense:  30 capsule    Refill:  3

## 2014-09-14 DIAGNOSIS — E871 Hypo-osmolality and hyponatremia: Secondary | ICD-10-CM | POA: Diagnosis not present

## 2014-09-15 LAB — BASIC METABOLIC PANEL
BUN/Creatinine Ratio: 16 (ref 10–22)
BUN: 11 mg/dL (ref 8–27)
CO2: 23 mmol/L (ref 18–29)
Calcium: 9.2 mg/dL (ref 8.6–10.2)
Chloride: 98 mmol/L (ref 97–108)
Creatinine, Ser: 0.69 mg/dL — ABNORMAL LOW (ref 0.76–1.27)
GFR calc Af Amer: 106 mL/min/{1.73_m2} (ref >59)
GFR, EST NON AFRICAN AMERICAN: 92 mL/min/{1.73_m2} (ref >59)
GLUCOSE: 97 mg/dL (ref 65–99)
Potassium: 4.8 mmol/L (ref 3.5–5.2)
SODIUM: 138 mmol/L (ref 134–144)

## 2014-10-10 ENCOUNTER — Encounter: Payer: Self-pay | Admitting: Family Medicine

## 2014-10-10 ENCOUNTER — Ambulatory Visit (INDEPENDENT_AMBULATORY_CARE_PROVIDER_SITE_OTHER): Payer: Medicare Other | Admitting: Family Medicine

## 2014-10-10 ENCOUNTER — Other Ambulatory Visit: Payer: Self-pay

## 2014-10-10 VITALS — BP 130/63 | HR 64 | Temp 97.9°F | Resp 16 | Ht 69.0 in | Wt 221.4 lb

## 2014-10-10 DIAGNOSIS — F32A Depression, unspecified: Secondary | ICD-10-CM

## 2014-10-10 DIAGNOSIS — F329 Major depressive disorder, single episode, unspecified: Secondary | ICD-10-CM | POA: Diagnosis not present

## 2014-10-10 NOTE — Progress Notes (Signed)
Name: Erik Palmer   MRN: [id number]    DOB: 1934-08-15   Date:10/10/2014       Progress Note  Subjective  Chief Complaint  Chief Complaint  Patient presents with  . Depression    Needs referral for Psych and [facility] has seen him before in hospital.   . Agitation  . Gastrophageal Reflux  . Hypertension    HPI  Need referral to Psych [facility]) for control of depression/anxiety/agitation.  Still taking low dose P{amelor and Xanax and BP and GERD meds.  Feels well except for his "mental problems". No problem-specific assessment & plan notes found for this encounter.   Past Medical History  Diagnosis Date  . Hypertension   . Prostate enlargement   . GERD (gastroesophageal reflux disease)   . Angina pectoris syndrome   . Hypercholesteremia   . Arthritis   . Grief at loss of child 09/03/2014  . 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    History  Substance Use Topics  . Smoking status: Never Smoker   . Smokeless tobacco: Never Used  . Alcohol Use: No     Current outpatient prescriptions:  .  ALPRAZolam (XANAX) 0.25 MG tablet, Take 1 tablet (0.25 mg total) by mouth 3 (three) times daily as needed for anxiety., Disp: 50 tablet, Rfl: 0 .  aspirin EC 81 MG tablet, Take 81 mg by mouth every morning., Disp: , Rfl:  .  atorvastatin (LIPITOR) 80 MG tablet, Take 40 mg by mouth every morning., Disp: , Rfl: 12 .  fluticasone (FLONASE) 50 MCG/ACT nasal spray, INHALE 2 SPRAYS INTO EACH NOSTRIL EVERY DAY, Disp: 16 g, Rfl: 12 .  metoprolol (LOPRESSOR) 50 MG tablet, Take 25 mg by mouth 2 (two) times daily., Disp: , Rfl: 12 .  nortriptyline (PAMELOR) 10 MG capsule, Take 1 capsule (10 mg total) by mouth at bedtime., Disp: 30 capsule, Rfl: 3 .  omeprazole (PRILOSEC) 20 MG capsule, Take 20 mg by mouth every morning., Disp: , Rfl: 12 .  tamsulosin (FLOMAX) 0.4 MG CAPS capsule, Take 0.4 mg by mouth every evening., Disp: , Rfl: 12 .  acetaminophen (TYLENOL) 650 MG CR tablet, Take 650  mg by mouth daily., Disp: , Rfl:  .  citalopram (CELEXA) 10 MG tablet, Take 10 mg by mouth daily., Disp: , Rfl: 2 .  zolpidem (AMBIEN) 5 MG tablet, Take 1 tablet (5 mg total) by mouth at bedtime as needed for sleep. (Patient not taking: Reported on 10/10/2014), Disp: 30 tablet, Rfl: 0  Allergies  Allergen Reactions  . Solu-Medrol [Methylprednisolone Acetate] Anxiety    Review of Systems  Constitutional: Negative for fever, chills, weight loss and malaise/fatigue.  HENT: Negative for hearing loss.   Eyes: Negative for blurred vision and double vision.  Respiratory: Negative for cough, sputum production, shortness of breath and wheezing.   Cardiovascular: Negative for chest pain, palpitations, orthopnea and leg swelling.  Gastrointestinal: Negative for heartburn, nausea, vomiting, abdominal pain, diarrhea and blood in stool.  Genitourinary: Positive for frequency. Negative for dysuria and urgency.  Skin: Negative for rash.  Neurological: Negative for dizziness, sensory change, focal weakness, weakness and headaches.  Psychiatric/Behavioral: Positive for depression. The patient is nervous/anxious.       Objective  Filed Vitals:   10/10/14 0905  BP: 130/63  Pulse: 64  Temp: 97.9 F (36.6 C)  Resp: 16  Height: $Remove'5\' 9"'bPUNrpg$  (1.753 m)  Weight: 221 lb 6.4 oz (100.426 kg)     Physical Exam  Constitutional:  He is oriented to person, place, and time and well-developed, well-nourished, and in no distress. No distress.  HENT:  Head: Normocephalic and atraumatic.  Eyes: Conjunctivae and EOM are normal. Pupils are equal, round, and reactive to light.  Neck: Normal range of motion. Neck supple. No thyromegaly present.  Cardiovascular: Normal rate, regular rhythm, normal heart sounds and intact distal pulses.  Exam reveals no gallop and no friction rub.   No murmur heard. Pulmonary/Chest: Effort normal and breath sounds normal. No respiratory distress. He has no wheezes. He has no rales.   Abdominal: Bowel sounds are normal. He exhibits no distension and no mass. There is no tenderness.  Musculoskeletal: Normal range of motion. He exhibits no edema.  Lymphadenopathy:    He has no cervical adenopathy.  Neurological: He is alert and oriented to person, place, and time. No cranial nerve deficit. Coordination normal.  Psychiatric:  PHQ-9 score of 14.  He is off topic a lot.  Some agitation.  Affect somewhat an xious.  Vitals reviewed.     Recent Results (from the past 2160 hour(s))  CBC     Status: Abnormal   Collection Time: 09/02/14  7:23 PM  Result Value Ref Range   WBC 10.6 3.8 - 10.6 K/uL   RBC 4.33 (L) 4.40 - 5.90 MIL/uL   Hemoglobin 14.2 13.0 - 18.0 g/dL    Comment: RESULT REPEATED AND VERIFIED   HCT 39.4 (L) 40.0 - 52.0 %    Comment: RESULT REPEATED AND VERIFIED   MCV 90.9 80.0 - 100.0 fL   MCH 32.8 26.0 - 34.0 pg   MCHC 36.1 (H) 32.0 - 36.0 g/dL   RDW 12.9 11.5 - 14.5 %   Platelets 315 150 - 440 K/uL  Basic metabolic panel     Status: Abnormal   Collection Time: 09/02/14  7:23 PM  Result Value Ref Range   Sodium 121 (L) 135 - 145 mmol/L   Potassium 3.3 (L) 3.5 - 5.1 mmol/L   Chloride 87 (L) 101 - 111 mmol/L   CO2 21 (L) 22 - 32 mmol/L   Glucose, Bld 118 (H) 65 - 99 mg/dL   BUN 8 6 - 20 mg/dL   Creatinine, Ser 0.58 (L) 0.61 - 1.24 mg/dL   Calcium 8.8 (L) 8.9 - 10.3 mg/dL   GFR calc non Af Amer >60 >60 mL/min   GFR calc Af Amer >60 >60 mL/min    Comment: (NOTE) The eGFR has been calculated using the CKD EPI equation. This calculation has not been validated in all clinical situations. eGFR's persistently <60 mL/min signify possible Chronic Kidney Disease.    Anion gap 13 5 - 15  Troponin I     Status: None   Collection Time: 09/02/14  7:23 PM  Result Value Ref Range   Troponin I <0.03 <0.031 ng/mL    Comment:        NO INDICATION OF MYOCARDIAL INJURY.   Hepatic function panel     Status: Abnormal   Collection Time: 09/02/14  7:23 PM  Result  Value Ref Range   Total Protein 6.9 6.5 - 8.1 g/dL   Albumin 4.5 3.5 - 5.0 g/dL   AST 25 15 - 41 U/L   ALT 20 17 - 63 U/L   Alkaline Phosphatase 69 38 - 126 U/L   Total Bilirubin 1.1 0.3 - 1.2 mg/dL   Bilirubin, Direct 0.1 0.1 - 0.5 mg/dL   Indirect Bilirubin 1.0 (H) 0.3 - 0.9 mg/dL  TSH  Status: None   Collection Time: 09/02/14  7:23 PM  Result Value Ref Range   TSH 1.509 0.350 - 4.500 uIU/mL  Urinalysis complete, with microscopic     Status: Abnormal   Collection Time: 09/02/14 10:50 PM  Result Value Ref Range   Color, Urine YELLOW (A) YELLOW   APPearance CLEAR (A) CLEAR   Glucose, UA NEGATIVE NEGATIVE mg/dL   Bilirubin Urine NEGATIVE NEGATIVE   Ketones, ur 1+ (A) NEGATIVE mg/dL   Specific Gravity, Urine 1.012 1.005 - 1.030   Hgb urine dipstick NEGATIVE NEGATIVE   pH 6.0 5.0 - 8.0   Protein, ur NEGATIVE NEGATIVE mg/dL   Nitrite NEGATIVE NEGATIVE   Leukocytes, UA NEGATIVE NEGATIVE   RBC / HPF 0-5 0 - 5 RBC/hpf   WBC, UA 0-5 0 - 5 WBC/hpf   Bacteria, UA NONE SEEN NONE SEEN   Squamous Epithelial / LPF NONE SEEN NONE SEEN   Mucous PRESENT   Osmolality, urine     Status: None   Collection Time: 09/02/14 10:50 PM  Result Value Ref Range   Osmolality, Ur 363 300 - 900 mOsm/kg  Basic metabolic panel     Status: Abnormal   Collection Time: 09/03/14  5:19 AM  Result Value Ref Range   Sodium 124 (L) 135 - 145 mmol/L   Potassium 3.1 (L) 3.5 - 5.1 mmol/L   Chloride 91 (L) 101 - 111 mmol/L   CO2 26 22 - 32 mmol/L   Glucose, Bld 110 (H) 65 - 99 mg/dL   BUN 8 6 - 20 mg/dL   Creatinine, Ser 0.61 0.61 - 1.24 mg/dL   Calcium 8.1 (L) 8.9 - 10.3 mg/dL   GFR calc non Af Amer >60 >60 mL/min   GFR calc Af Amer >60 >60 mL/min    Comment: (NOTE) The eGFR has been calculated using the CKD EPI equation. This calculation has not been validated in all clinical situations. eGFR's persistently <60 mL/min signify possible Chronic Kidney Disease.    Anion gap 7 5 - 15  Osmolality      Status: Abnormal   Collection Time: 09/03/14  2:56 PM  Result Value Ref Range   Osmolality 262 (L) 275 - 295 mOsm/kg  Magnesium     Status: None   Collection Time: 09/03/14  2:56 PM  Result Value Ref Range   Magnesium 1.9 1.7 - 2.4 mg/dL  Basic metabolic panel     Status: Abnormal   Collection Time: 09/04/14  6:12 AM  Result Value Ref Range   Sodium 133 (L) 135 - 145 mmol/L   Potassium 3.6 3.5 - 5.1 mmol/L   Chloride 103 101 - 111 mmol/L   CO2 27 22 - 32 mmol/L   Glucose, Bld 103 (H) 65 - 99 mg/dL   BUN 8 6 - 20 mg/dL   Creatinine, Ser 0.60 (L) 0.61 - 1.24 mg/dL   Calcium 8.5 (L) 8.9 - 10.3 mg/dL   GFR calc non Af Amer >60 >60 mL/min   GFR calc Af Amer >60 >60 mL/min    Comment: (NOTE) The eGFR has been calculated using the CKD EPI equation. This calculation has not been validated in all clinical situations. eGFR's persistently <60 mL/min signify possible Chronic Kidney Disease.    Anion gap 3 (L) 5 - 15  Basic Metabolic Panel (BMET)     Status: Abnormal   Collection Time: 09/14/14 10:08 AM  Result Value Ref Range   Glucose 97 65 - 99 mg/dL   BUN 11  8 - 27 mg/dL   Creatinine, Ser 0.69 (L) 0.76 - 1.27 mg/dL   GFR calc non Af Amer 92 >59 mL/min/1.73   GFR calc Af Amer 106 >59 mL/min/1.73   BUN/Creatinine Ratio 16 10 - 22   Sodium 138 134 - 144 mmol/L   Potassium 4.8 3.5 - 5.2 mmol/L   Chloride 98 97 - 108 mmol/L   CO2 23 18 - 29 mmol/L   Calcium 9.2 8.6 - 10.2 mg/dL     Assessment & Plan  1. Depression  - Ambulatory referral to Psychiatry

## 2014-10-20 ENCOUNTER — Telehealth: Payer: Self-pay | Admitting: Family Medicine

## 2014-10-23 NOTE — Telephone Encounter (Signed)
error 

## 2014-10-26 ENCOUNTER — Ambulatory Visit (INDEPENDENT_AMBULATORY_CARE_PROVIDER_SITE_OTHER): Payer: Medicare Other | Admitting: Licensed Clinical Social Worker

## 2014-10-26 DIAGNOSIS — F32A Depression, unspecified: Secondary | ICD-10-CM

## 2014-10-26 DIAGNOSIS — F329 Major depressive disorder, single episode, unspecified: Secondary | ICD-10-CM | POA: Diagnosis not present

## 2014-10-26 NOTE — Progress Notes (Signed)
Patient ID: Erik Palmer, male   DOB: 05-01-1934, 80 y.o.   MRN: [id number] Patient:   Erik Palmer   DOB:   Jul 09, 1934  MR Number:  [id number]  Location:  [facility] [address] [place] [address] Dept: [phone]           Date of Service:   10/26/2014  Start Time:   9a End Time:   1030a  Provider/Observer:  Marinda Elk Counselor       Billing Code/Service: [phone]  Behavioral Observation: Soyla Murphy Goffredo  presents as a 80 y.o.-year-old Caucasian Male who appeared his stated age. his dress was Appropriate and he was Neat and Well Groomed and his manners were Appropriate to the situation.  There were not any physical disabilities noted.  he displayed an appropriate level of cooperation and motivation.    Interactions:    Active   Attention:   within normal limits  Memory:   within normal limits  Speech (Volume):  normal  Speech:   normal volume  Thought Process:  Coherent and Relevant  Though Content:  WNL  Orientation:   person, place, time/date and situation  Judgment:   Fair  Planning:   Good  Affect:    Depressed  Mood:    Depressed  Insight:   Good  Intelligence:   normal  Chief Complaint:     Chief Complaint  Patient presents with  . Depression  . Headache  . Establish Care    Reason for Service:   "I want to improve on my depression, anxiety and sleep habits; if i get help my life will be just fine."  Current Symptoms:  September 16 2012 received a shot to reduce chest and head congestion.  Reports that after the shot he began having sleep and eating problems. Forces himself to eat. Reports that he has difficulty sleeping on average about 5 hours of sleep.  Takes an aleve PM & a motion sickness pill to help him sleep.  Reports that he is weak emotionally,   His friend accompanied him and stated that she escorted him to the  Emergency Room and was placed in "lock down"  Obtained Celexa from his PCP; was taken to the hospital on September 02, 2014 due to being weak, sodium and potassium levels were low.  Was placed on Nortripline; reports that he felt agitated.  His son passed away a few years ago; he is currently caring for son's dog.  He at times gets fixated on caring for the dog.  Daughter in law and son passed away 4 years ago.  His best friend died a year ago.  Source of Distress:              Inability to sleep  Marital Status/Living: Married since 1962; Currently lives with a wife and his dog  Employment History: International aid/development worker; retired since 2005.  Education:   HS Graduate; [employer] in 1956  Legal History:  Denies   Research officer, trade union:  [phone] [employer]; Dentist   Religious/Spiritual Preferences:  [employer]  Family/Childhood History:                          Born in [place]; reports a "good & poor" childhood. Has 5 siblings.    Children/Grand-children:    Misty Stanley 12, Ronnie (died at 8; would be 64) Casimiro Needle 46  Natural/Informal Support:                          Wife and dog   Substance Use:  No concerns of substance abuse are reported.  Smoked from age [age] [place] [employer] less than a pack     Medical History:   Past Medical History  Diagnosis Date  . Hypertension   . Prostate enlargement   . GERD (gastroesophageal reflux disease)   . Angina pectoris syndrome   . Hypercholesteremia   . Arthritis   . Grief at loss of child 09/03/2014  . 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          Medication List       This list is accurate as of: 10/26/14  9:13 AM.  Always use your most recent med list.               acetaminophen 650 MG CR tablet  Commonly known as:  TYLENOL  Take 650 mg by mouth daily.     ALPRAZolam 0.25 MG tablet  Commonly known as:  XANAX  Take 1 tablet (0.25 mg total) by mouth 3 (three) times daily as needed  for anxiety.     aspirin EC 81 MG tablet  Take 81 mg by mouth every morning.     atorvastatin 80 MG tablet  Commonly known as:  LIPITOR  Take 40 mg by mouth every morning.     citalopram 10 MG tablet  Commonly known as:  CELEXA  Take 10 mg by mouth daily.     fluticasone 50 MCG/ACT nasal spray  Commonly known as:  FLONASE  INHALE 2 SPRAYS INTO EACH NOSTRIL EVERY DAY     metoprolol 50 MG tablet  Commonly known as:  LOPRESSOR  Take 25 mg by mouth 2 (two) times daily.     nortriptyline 10 MG capsule  Commonly known as:  PAMELOR  Take 1 capsule (10 mg total) by mouth at bedtime.     omeprazole 20 MG capsule  Commonly known as:  PRILOSEC  Take 20 mg by mouth every morning.     tamsulosin 0.4 MG Caps capsule  Commonly known as:  FLOMAX  Take 0.4 mg by mouth every evening.     zolpidem 5 MG tablet  Commonly known as:  AMBIEN  Take 1 tablet (5 mg total) by mouth at bedtime as needed for sleep.              Sexual History:   History  Sexual Activity  . Sexual Activity: Not on file     Abuse/Trauma History: Family and friends that died   Psychiatric History:  Was placed on "lock down" at the hospital for half a day.     Strengths:   Riding in a car, mows the grass   Recovery Goals:  "I want to improve on my depression, anxiety and sleep habits; if i get help my life will be just fine."  Hobbies/Interests:               Riding, fishing   Challenges/Barriers: Inability to sleep, agitation    Family Med/Psych History:  Family History  Problem Relation Age of Onset  . Diabetes Neg Hx     Risk of Suicide/Violence: low   History of Suicide/Violence:  Denies   Psychosis:   Denies   Diagnosis:    Depression  Impression/DX:  Edge is currently diagnosed  with Major Depression due to his current symptoms sleep disturbance, reduction in eating patterns, fatigue, lack of motivation, loss of interest in activities.   Keisean will be best supported by  medication management and outpatient therapy to assist with coping skills and understating his triggers.  Jourdan does not have a history of HI or SI.  He currently has protective factors.  He has good relationships with others and denies psychosis.    Recommendation/Plan: Writer recommends Outpatient Therapy at least twice monthly to include but not limited to individual, group and or family therapy.  Medication Management is also recommended to assist with his mood.

## 2014-10-27 ENCOUNTER — Other Ambulatory Visit: Payer: Self-pay | Admitting: Family Medicine

## 2014-10-27 ENCOUNTER — Ambulatory Visit: Payer: Self-pay | Admitting: Family Medicine

## 2014-10-27 ENCOUNTER — Telehealth: Payer: Self-pay

## 2014-10-27 MED ORDER — ALPRAZOLAM 0.25 MG PO TABS: 0.2500 mg | ORAL_TABLET | Freq: Three times a day (TID) | ORAL | Status: DC | PRN

## 2014-10-27 NOTE — Telephone Encounter (Signed)
Pt's wife Lurena Joiner said pt went to psych appt at [facility] but will not see psychiatrist until Sept 20th.  She asked if Dr. Juanetta Gosling would refill his xanax once to get him through to that appt.  Her call back number is [phone]

## 2014-10-27 NOTE — Telephone Encounter (Signed)
I have not called them yet but please review the two messages from today and advise me on what to say. [facility]

## 2014-10-27 NOTE — Telephone Encounter (Signed)
I will give tid for 23 days only.  Already printed.-jh

## 2014-10-27 NOTE — Telephone Encounter (Signed)
Pt's wife called stating that she did call this am and left a msg for Dr. Juanetta Gosling' nurse. Pt was made aware that nurse is currently with a pt and has has been with pts all am, but that she would be made aware of the msgs and that it was urgent. Please advise.

## 2014-10-27 NOTE — Telephone Encounter (Signed)
Rx up front Advised wife. [facility]

## 2014-11-21 ENCOUNTER — Ambulatory Visit (INDEPENDENT_AMBULATORY_CARE_PROVIDER_SITE_OTHER): Payer: Medicare Other | Admitting: Psychiatry

## 2014-11-21 ENCOUNTER — Ambulatory Visit (INDEPENDENT_AMBULATORY_CARE_PROVIDER_SITE_OTHER): Payer: Medicare Other | Admitting: Licensed Clinical Social Worker

## 2014-11-21 ENCOUNTER — Encounter: Payer: Self-pay | Admitting: Psychiatry

## 2014-11-21 VITALS — BP 130/72 | HR 80 | Temp 97.7°F | Ht 69.0 in | Wt 223.0 lb

## 2014-11-21 DIAGNOSIS — F411 Generalized anxiety disorder: Secondary | ICD-10-CM | POA: Diagnosis not present

## 2014-11-21 DIAGNOSIS — F331 Major depressive disorder, recurrent, moderate: Secondary | ICD-10-CM

## 2014-11-21 MED ORDER — QUETIAPINE FUMARATE 50 MG PO TABS: 50.0000 mg | ORAL_TABLET | Freq: Every day | ORAL | Status: DC

## 2014-11-21 MED ORDER — ALPRAZOLAM 0.25 MG PO TABS: 0.2500 mg | ORAL_TABLET | Freq: Every day | ORAL | Status: DC

## 2014-11-21 NOTE — Progress Notes (Signed)
Psychiatric Initial Adult Assessment   Patient Identification: Erik Palmer MRN:  [id number] Date of Evaluation:  11/21/2014 Referral Source: [facility] Chief Complaint:   Chief Complaint    Establish Care; Insomnia; Anxiety; Depression     Visit Diagnosis:    ICD-9-CM ICD-10-CM   1. MDD (major depressive disorder), recurrent episode, moderate 296.32 F33.1   2. GAD (generalized anxiety disorder) 300.02 F41.1    Diagnosis:   Patient Active Problem List   Diagnosis Date Noted  . Acute anxiety [F41.9] 09/12/2014  . Grief at loss of child [F43.21] 09/03/2014  . Anxiety disorder due to general medical condition [F06.4] 09/03/2014  . Hyponatremia [E87.1] 09/02/2014   History of Present Illness:    Patient is a 80 year old male who presented with his wife for initial assessment. He was referred by the [facility] medical group. He has history of depression and anxiety disorder which started in 2014. Patient reported that he is unable to sleep and has been taking a combination of Xanax, Motrin PM and motion sickness medication to help him sleep at night on a daily basis. During interview it was discovered that patient has been actually taking 75 mg of Benadryl at night on a daily basis to help him sleep and his wife reported that he has been constipated for long. Of time and has been complaining of blurred vision. Patient reported that he has been tried on nortriptyline, Xanax, trazodone by Dr. Juanetta Gosling but he they were all ineffective and he did not respond well to any of the medications.  Patient reported that all his symptoms started in 2014 when he was given Solu-Medrol and he started having severe anxiety since then. He reported that he is unable to sleep since then. He also has lost his son in 2012 as well as his daughter-in-law in the same year. Patient reported that he raised their daughter who is now 78 years old and lives on her own. He reported that his son passed away  due to septicemia and daughter-in-law due to cirrhosis as she was drinking heavily. He reported that he became depressed and having anxiety related to his death.  Patient reported that he has tried several medications prescribed by Dr. Juanetta Gosling but they are not helpful and he was referred by him for adjustment of his medications for depression and anxiety. He currently denied having any issues with his memory as well as suicidal ideations or plans. He appeared pleasantly cooperative during the interview. Elements:  Location:  anxiety and depression. Associated Signs/Symptoms: Depression Symptoms:  depressed mood, insomnia, fatigue, feelings of worthlessness/guilt, hopelessness, anxiety, loss of energy/fatigue, disturbed sleep, (Hypo) Manic Symptoms:  Labiality of Mood, Anxiety Symptoms:  Excessive Worry, Psychotic Symptoms:  none PTSD Symptoms: Negative NA  Past Medical History:  Past Medical History  Diagnosis Date  . Hypertension   . Prostate enlargement   . GERD (gastroesophageal reflux disease)   . Angina pectoris syndrome   . Hypercholesteremia   . Arthritis   . Grief at loss of child 09/03/2014  . 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   History reviewed. No pertinent past surgical history. Family History:  Family History  Problem Relation Age of Onset  . Diabetes Neg Hx   . Hypertension Mother   . Heart block Mother   . Stroke Mother   . Depression Mother   . Cancer Father   . Heart attack Sister   . COPD Sister   . Hypertension Brother   .  Hypertension Brother   . Hypertension Brother   . Hypertension Brother   . Heart attack Brother    Social History:   Social History   Social History  . Marital Status: Married    Spouse Name: N/A  . Number of Children: N/A  . Years of Education: N/A   Social History Main Topics  . Smoking status: Never Smoker   . Smokeless tobacco: Never Used  . Alcohol Use: No  . Drug Use: No  . Sexual  Activity: Not Currently   Other Topics Concern  . None   Social History Narrative   Additional Social History:   Currently married and lives with his wife. He had 3 children and one of them passed away. She was in [employer] and is currently retired.  Musculoskeletal: Strength & Muscle Tone: within normal limits Gait & Station: normal Patient leans: Right  Psychiatric Specialty Exam: HPI  Review of Systems  Psychiatric/Behavioral: Positive for depression. The patient is nervous/anxious and has insomnia.   All other systems reviewed and are negative.   Blood pressure 130/72, pulse 80, temperature 97.7 F (36.5 C), temperature source Tympanic, height  (1.753 m), weight 223 lb (101.152 kg), SpO2 95 %.Body mass index is 32.92 kg/(m^2).  General Appearance: Casual and Fairly Groomed  Eye Contact:  Fair  Speech:  Clear and Coherent and Normal Rate  Volume:  Normal  Mood:  Anxious and Depressed  Affect:  Congruent  Thought Process:  Coherent  Orientation:  Full (Time, Place, and Person)  Thought Content:  WDL  Suicidal Thoughts:  No  Homicidal Thoughts:  No  Memory:  Immediate;   Fair  Judgement:  Fair  Insight:  Fair  Psychomotor Activity:  Normal  Concentration:  Fair  Recall:  [employer] of Knowledge:Fair  Language: Fair  Akathisia:  No  Handed:  Right  AIMS (if indicated):    Assets:  Communication Skills Desire for Improvement Housing Intimacy Physical Health Social Support  ADL's:  Intact  Cognition: WNL  Sleep:  Interrupted sleep and wakes up every hours to go to the bathroom      Allergies:   Allergies  Allergen Reactions  . Solu-Medrol [Methylprednisolone Acetate] Anxiety   Current Medications: Current Outpatient Prescriptions  Medication Sig Dispense Refill  . acetaminophen (TYLENOL) 650 MG CR tablet Take 650 mg by mouth daily.    [name] ALPRAZolam (XANAX) 0.25 MG tablet Take 1 tablet (0.25 mg total) by mouth 3 (three) times daily as needed for  anxiety. 69 tablet 0  . aspirin EC 81 MG tablet Take 81 mg by mouth every morning.    [name] atorvastatin (LIPITOR) 80 MG tablet Take 40 mg by mouth every morning.  12  . citalopram (CELEXA) 10 MG tablet Take 10 mg by mouth daily.  2  . fluticasone (FLONASE) 50 MCG/ACT nasal spray INHALE 2 SPRAYS INTO EACH NOSTRIL EVERY DAY 16 g 12  . metoprolol (LOPRESSOR) 50 MG tablet Take 25 mg by mouth 2 (two) times daily.  12  . naproxen sodium (ANAPROX) 220 MG tablet Take 220 mg by mouth 2 (two) times daily with a meal.    . omeprazole (PRILOSEC) 20 MG capsule Take 20 mg by mouth every morning.  12  . tamsulosin (FLOMAX) 0.4 MG CAPS capsule Take 0.4 mg by mouth every evening.  12  . nortriptyline (PAMELOR) 10 MG capsule Take 1 capsule (10 mg total) by mouth at bedtime. (Patient not taking: Reported on 11/21/2014) 30 capsule  3  . zolpidem (AMBIEN) 5 MG tablet Take 1 tablet (5 mg total) by mouth at bedtime as needed for sleep. (Patient not taking: Reported on 11/21/2014) 30 tablet 0   No current facility-administered medications for this visit.    Previous Psychotropic Medications: Received Soulmedrol in 2014 led  to his anxiety symptoms Valium and Prozac -2014 by Dr Juanetta Gosling  Prozac again in this summer Celexa prescribed by Dr Juanetta Gosling September 04 2014- anxiety and depression.  Insomnia and takes Meclizine, Xanax and Motrin PM to help him sleep.  Dr Juanetta Gosling started Nortriptyline for sleep, did not help.   Substance Abuse History in the last 12 months:  No.  Consequences of Substance Abuse: Negative NA  Medical Decision Making:  Review of Psycho-Social Stressors (1) and Review and summation of old records (2)  Treatment Plan Summary: Medication management   Discussed with patient what the medications and he will continue on the following medications  Mood symptoms/ sleep I will start him on Seroquel 50 mg by mouth daily at bedtime. Discussed with him in detail about the medication dose benefits and  alternatives. His wife will monitor his medications and will titrate the dose gradually if he is still not sleeping to 100 mg. Patient will call for the refill if he is running out of the medication before his next appointment.  Anxiety He will continue on Xanax 0.25 mg at bedtime. Patient was given the prescription for 30 pills at this time.  Follow-up Follow-up in one month or earlier    More than 50% of the time spent in psychoeducation, counseling and coordination of care.    This note was generated in part or whole with voice recognition software. Voice regonition is usually quite accurate but there are transcription errors that can and very often do occur. I apologize for any typographical errors that were not detected and corrected.    Brandy Hale, MD

## 2014-11-22 ENCOUNTER — Ambulatory Visit: Payer: Medicare Other | Admitting: Licensed Clinical Social Worker

## 2014-11-22 NOTE — Progress Notes (Signed)
   THERAPIST PROGRESS NOTE  Session Time: 3p; 1 hour  Participation Level: Active  Behavioral Response: CasualAlertAnxious  Type of Therapy: Family Therapy  Treatment Goals addressed: Diagnosis: Generalized Anxiety Disorder  Interventions: CBT, Motivational Interviewing, Supportive and Reframing  Summary: Erik Palmer is a 80 y.o. male who presents with lack of sleep and anxious.  [name] continues to isolate himself at times.  He has difficulty with sleeping throughout the night.  At night he consumes motion sickness pills as a sleep aid.  He continues to report that his Primary Care Physician provided him with a medication several years ago that created these symptoms.  He eats regularly and has family support.   Suicidal/Homicidal: Nowithout intent/plan  Therapist Response: Writer was open and supportive throughout the session.  Writer assisted Patient with coping skills and understanding his diagnosis.   Plan: Return again in 4 weeks.  Diagnosis: Axis I: Generalized Anxiety Disorder    Axis II: No diagnosis    Marinda Elk 11/22/2014

## 2014-12-20 DIAGNOSIS — H2513 Age-related nuclear cataract, bilateral: Secondary | ICD-10-CM | POA: Diagnosis not present

## 2014-12-21 ENCOUNTER — Ambulatory Visit: Payer: Medicare Other | Admitting: Licensed Clinical Social Worker

## 2014-12-21 ENCOUNTER — Ambulatory Visit (INDEPENDENT_AMBULATORY_CARE_PROVIDER_SITE_OTHER): Payer: Medicare Other | Admitting: Psychiatry

## 2014-12-21 ENCOUNTER — Encounter: Payer: Self-pay | Admitting: Psychiatry

## 2014-12-21 VITALS — BP 120/76 | HR 79 | Temp 98.0°F | Ht 69.0 in | Wt 227.4 lb

## 2014-12-21 DIAGNOSIS — F331 Major depressive disorder, recurrent, moderate: Secondary | ICD-10-CM

## 2014-12-21 MED ORDER — QUETIAPINE FUMARATE 50 MG PO TABS: 50.0000 mg | ORAL_TABLET | Freq: Every day | ORAL | Status: DC

## 2014-12-21 NOTE — Progress Notes (Signed)
Psychiatric MD/NP Adult Assessment   Patient Identification: Erik Palmer MRN:  [id number] Date of Evaluation:  12/21/2014 [facility] Chief Complaint:   Chief Complaint    Follow-up; Medication Refill     Visit Diagnosis:    ICD-9-CM ICD-10-CM   1. MDD (major depressive disorder), recurrent episode, moderate (HCC) 296.32 F33.1    Diagnosis:   Patient Active Problem List   Diagnosis Date Noted  . Acute anxiety [F41.9] 09/12/2014  . Grief at loss of child [F43.21] 09/03/2014  . Anxiety disorder due to general medical condition [F06.4] 09/03/2014  . Hyponatremia [E87.1] 09/02/2014   History of Present Illness:    Patient is a 80 year old male who presented with his wife for follow up.  [facility]. He  stated that he is currently doing well on his medication and has responded well to the Seroquel 50 mg. He reported that now he sleeping well and his wife reported that he does not have any side effects of the medication. His wife also mentioned that he is taking naps during the daytime and it is very good. She stated that he is not taking Xanax at this time. Patient stated that he has not completely given up on the Xanax and will take it on a when necessary basis. He appeared calm and collected during the interview. He currently denied having any adverse effects of the medications. He reported that he is very happy with the change in medications.   His mood symptoms are under control and he appears calm and collective during the interview. He currently denied having any suicidal homicidal ideations or plans.     Past Medical History:  Past Medical History  Diagnosis Date  . Hypertension   . Prostate enlargement   . GERD (gastroesophageal reflux disease)   . Angina pectoris syndrome (HCC)   . Hypercholesteremia   . Arthritis   . Grief at loss of child 09/03/2014  . Anxiety disorder due to general medical  condition 09/03/2014   History reviewed. No pertinent past surgical history. Family History:  Family History  Problem Relation Age of Onset  . Diabetes Neg Hx   . Hypertension Mother   . Heart block Mother   . Stroke Mother   . Depression Mother   . Cancer Father   . Heart attack Sister   . COPD Sister   . Hypertension Brother   . Hypertension Brother   . Hypertension Brother   . Hypertension Brother   . Heart attack Brother    Social History:   Social History   Social History  . Marital Status: Married    Spouse Name: N/A  . Number of Children: N/A  . Years of Education: N/A   Social History Main Topics  . Smoking status: Never Smoker   . Smokeless tobacco: Never Used  . Alcohol Use: No  . Drug Use: No  . Sexual Activity: Not Currently   Other Topics Concern  . None   Social History Narrative   Additional Social History:   Currently married and lives with his wife. He had 3 children and one of them passed away. She was in [employer] and is currently retired.  Musculoskeletal: Strength & Muscle Tone: within normal limits Gait & Station: normal Patient leans: Right  Psychiatric Specialty Exam: HPI   Review of Systems  Psychiatric/Behavioral: Positive for depression. The patient is nervous/anxious and has insomnia.   All other systems  reviewed and are negative.   Blood pressure 120/76, pulse 79, temperature 98 F (36.7 C), temperature source Tympanic, height  (1.753 m), weight 227 lb 6.4 oz (103.148 kg), SpO2 94 %.Body mass index is 33.57 kg/(m^2).  General Appearance: Casual and Fairly Groomed  Eye Contact:  Fair  Speech:  Clear and Coherent and Normal Rate  Volume:  Normal  Mood:  Euthymic  Affect:  Congruent  Thought Process:  Coherent  Orientation:  Full (Time, Place, and Person)  Thought Content:  WDL  Suicidal Thoughts:  No  Homicidal Thoughts:  No  Memory:  Immediate;   Fair  Judgement:  Fair  Insight:  Fair  Psychomotor Activity:  Normal   Concentration:  Fair  Recall:  [employer] of Knowledge:Fair  Language: Fair  Akathisia:  No  Handed:  Right  AIMS (if indicated):    Assets:  Communication Skills Desire for Improvement Housing Intimacy Physical Health Social Support  ADL's:  Intact  Cognition: WNL  Sleep:  Interrupted sleep and wakes up every hours to go to the bathroom      Allergies:   Allergies  Allergen Reactions  . Solu-Medrol [Methylprednisolone Acetate] Anxiety   Current Medications: Current Outpatient Prescriptions  Medication Sig Dispense Refill  . acetaminophen (TYLENOL) 650 MG CR tablet Take 650 mg by mouth daily.    [name] ALPRAZolam (XANAX) 0.25 MG tablet Take 1 tablet (0.25 mg total) by mouth at bedtime. 30 tablet 0  . aspirin EC 81 MG tablet Take 81 mg by mouth every morning.    [name] atorvastatin (LIPITOR) 80 MG tablet Take 40 mg by mouth every morning.  12  . fluticasone (FLONASE) 50 MCG/ACT nasal spray INHALE 2 SPRAYS INTO EACH NOSTRIL EVERY DAY 16 g 12  . metoprolol (LOPRESSOR) 50 MG tablet Take 25 mg by mouth 2 (two) times daily.  12  . naproxen sodium (ANAPROX) 220 MG tablet Take 220 mg by mouth 2 (two) times daily with a meal.    . omeprazole (PRILOSEC) 20 MG capsule Take 20 mg by mouth every morning.  12  . QUEtiapine (SEROQUEL) 50 MG tablet Take 1 tablet (50 mg total) by mouth at bedtime. 90 tablet 1  . tamsulosin (FLOMAX) 0.4 MG CAPS capsule Take 0.4 mg by mouth every evening.  12   No current facility-administered medications for this visit.    Previous Psychotropic Medications: Received Soulmedrol in 2014 led  to his anxiety symptoms Valium and Prozac -2014 by Dr Juanetta Gosling  Prozac again in this summer Celexa prescribed by Dr Juanetta Gosling September 04 2014- anxiety and depression.  Insomnia and takes Meclizine, Xanax and Motrin PM to help him sleep.  Dr Juanetta Gosling started Nortriptyline for sleep, did not help.   Substance Abuse History in the last 12 months:  No.  Consequences of Substance  Abuse: Negative NA  Medical Decision Making:  Review of Psycho-Social Stressors (1) and Review and summation of old records (2)  Treatment Plan Summary: Medication management   Discussed with patient what the medications and he will continue on the following medications  Mood symptoms/ sleep Continue Seroquel 50 mg by mouth daily at bedtime and was given 90 day supply of the medications Discussed with him in detail about the medication dose benefits and alternatives.   Anxiety He will continue on Xanax 0.25 mg prn  at bedtime. His last reported that he has enough supply at this time and does not need the refill.    Follow-up Follow-up in 3  month or earlier    More than 50% of the time spent in psychoeducation, counseling and coordination of care.   Time spent with the patient 25 mins.    This note was generated in part or whole with voice recognition software. Voice regonition is usually quite accurate but there are transcription errors that can and very often do occur. I apologize for any typographical errors that were not detected and corrected.    Brandy Hale, MD

## 2014-12-22 ENCOUNTER — Ambulatory Visit (INDEPENDENT_AMBULATORY_CARE_PROVIDER_SITE_OTHER): Payer: Medicare Other | Admitting: Family Medicine

## 2014-12-22 ENCOUNTER — Encounter: Payer: Self-pay | Admitting: Family Medicine

## 2014-12-22 VITALS — BP 132/72 | HR 69 | Resp 16 | Wt 226.8 lb

## 2014-12-22 DIAGNOSIS — E669 Obesity, unspecified: Secondary | ICD-10-CM | POA: Insufficient documentation

## 2014-12-22 DIAGNOSIS — Z0000 Encounter for general adult medical examination without abnormal findings: Secondary | ICD-10-CM

## 2014-12-22 NOTE — Progress Notes (Signed)
Patient: Crista ElliotWalter D Beaulac, Male    DOB: 1935-01-02, 80 y.o.   MRN: [id number] Visit Date: 12/22/2014  Today's Provider: Fidel LevyJames Hawkins Jr, MD   Chief Complaint  Patient presents with  . Medicare Wellness    Subsiquent    Subjective:    Annual wellness visit Soyla MurphyWalter D Cammarano is a 80 y.o. male who presents today for his Subsequent Annual Wellness Visit. He feels well. He reports exercising . He reports he is sleeping well.   ----------------------------------------------------------- HPI  Review of Systems  Social History   Social History  . Marital Status: Married    Spouse Name: N/A  . Number of Children: N/A  . Years of Education: N/A   Occupational History  . Not on file.   Social History Main Topics  . Smoking status: Never Smoker   . Smokeless tobacco: Never Used  . Alcohol Use: No  . Drug Use: No  . Sexual Activity: Not Currently   Other Topics Concern  . Not on file   Social History Narrative    Patient Active Problem List   Diagnosis Date Noted  . Morbid obesity (HCC) 12/22/2014  . Medicare annual wellness visit, subsequent 12/22/2014  . Acute anxiety 09/12/2014  . Grief at loss of child 09/03/2014  . 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 Hyponatremia 09/02/2014    History reviewed. No pertinent past surgical history.  His family history includes COPD in his sister; Cancer in his father; Depression in his mother; Heart attack in his brother and sister; Heart block in his mother; Hypertension in his brother, brother, brother, brother, and mother; Stroke in his mother. There is no history of Diabetes.    Previous Medications   ACETAMINOPHEN (TYLENOL) 650 MG CR TABLET    Take 650 mg by mouth daily.   ALPRAZOLAM (XANAX) 0.25 MG TABLET    Take 1 tablet (0.25 mg total) by mouth at bedtime.   ASPIRIN EC 81 MG TABLET    Take 81 mg by mouth every morning.   ATORVASTATIN (LIPITOR) 80 MG TABLET    Take 40 mg by mouth every morning.   FLUTICASONE (FLONASE) 50 MCG/ACT NASAL SPRAY    INHALE 2 SPRAYS INTO EACH NOSTRIL EVERY DAY   METOPROLOL (LOPRESSOR) 50 MG TABLET    Take 25 mg by mouth 2 (two) times daily.   NAPROXEN SODIUM (ANAPROX) 220 MG TABLET    Take 220 mg by mouth 2 (two) times daily with a meal.   OMEPRAZOLE (PRILOSEC) 20 MG CAPSULE    Take 20 mg by mouth every morning.   QUETIAPINE (SEROQUEL) 50 MG TABLET    Take 1 tablet (50 mg total) by mouth at bedtime.   TAMSULOSIN (FLOMAX) 0.4 MG CAPS CAPSULE    Take 0.4 mg by mouth every evening.    Patient Care Team: Janeann ForehandJames H Hawkins Jr., MD as PCP - General (Family Medicine)     Objective:   Vitals: BP 132/72 mmHg  Pulse 69  Resp 16  Wt 226 lb 12.8 oz (102.876 kg)  Physical Exam  Activities of Daily Living In your present state of health, do you have any difficulty performing the following activities: 12/22/2014 12/22/2014  Hearing? Y N  Vision? N N  Difficulty concentrating or making decisions? N N  Walking or climbing stairs? N N  Dressing or bathing? N N  Doing errands, shopping? N N  Preparing Food and eating ? - N  Using the Toilet? N N  In  the past six months, have you accidently leaked urine? - N  Do you have problems with loss of bowel control? N N  Managing your Medications? N N  Managing your Finances? N N  Housekeeping or managing your Housekeeping? N N    Fall Risk Assessment Fall Risk  12/22/2014 10/10/2014 09/12/2014  Falls in the past year? No No No     Patient reports there are not safety devices in place in shower at home.   Depression Screen PHQ 2/9 Scores 12/22/2014 10/10/2014 09/12/2014  PHQ - 2 Score 0 3 0  PHQ- 9 Score - 14 -     MMSE MMSE - Mini Mental State Exam 12/22/2014  Orientation to time 5  Orientation to Place 5  Registration 3  Attention/ Calculation 5  Recall 3  Language- name 2 objects 2  Language- repeat 1  Language- follow 3 step command 3  Language- read & follow direction 1  Write a sentence 1  Copy  design 1  Total score 30     Assessment & Plan:     Annual Wellness Visit  Reviewed patient's Family Medical History Reviewed and updated list of patient's medical providers Assessment of cognitive impairment was done Assessed patient's functional ability Established a written schedule for health screening services Health Risk Assessent Completed and Reviewed  Exercise Activities and Dietary recommendations Goals    . Remain Stable     Keep active and control anxiety.        Immunization History  Administered Date(s) Administered  . Pneumococcal Conjugate-13 06/20/2014  . Tdap 06/20/2014    Health Maintenance  Topic Date Due  . TETANUS/TDAP  09/17/1955  . INFLUENZA VACCINE  12/02/2015 (Originally 10/02/2014)  . PNA vac Low Risk Adult (1 of 2 - PCV13) 12/02/2015 (Originally 09/16/2001)  . ZOSTAVAX  03/03/2016 (Originally 09/16/1996)     Discussed health benefits of physical activity, and encouraged him to engage in regular exercise appropriate for his age and condition.    ------------------------------------------------------------------------------------------------------------   Problem List Items Addressed This Visit      Other   Morbid obesity (HCC) - Primary   Medicare annual wellness visit, subsequent     1. Medicare annual wellness visit, subsequent   2. Morbid obesity, unspecified obesity type (HCC)   Venora Maples, MD [facility]  12/22/2014

## 2014-12-22 NOTE — Patient Instructions (Addendum)
Health Maintenance  Topic Date Due  . TETANUS/TDAP  09/17/1953  . INFLUENZA VACCINE  12/02/2015 (Originally 10/02/2014)  . PNA vac Low Risk Adult (1 of 2 - PCV13) 12/02/2015 (Originally 09/16/2001)  . ZOSTAVAX  03/03/2016 (Originally 09/16/1996)    Patient declined colonoscopy.  Gets stool for Hemocult yearly.

## 2015-01-11 ENCOUNTER — Ambulatory Visit: Payer: Self-pay | Admitting: Family Medicine

## 2015-01-22 ENCOUNTER — Ambulatory Visit: Payer: Self-pay | Admitting: Family Medicine

## 2015-02-08 ENCOUNTER — Ambulatory Visit: Payer: Self-pay | Admitting: Family Medicine

## 2015-02-13 ENCOUNTER — Ambulatory Visit (INDEPENDENT_AMBULATORY_CARE_PROVIDER_SITE_OTHER): Payer: Medicare Other | Admitting: Family Medicine

## 2015-02-13 ENCOUNTER — Encounter: Payer: Self-pay | Admitting: Family Medicine

## 2015-02-13 VITALS — BP 160/70 | HR 75 | Temp 97.6°F | Resp 16 | Ht 69.0 in

## 2015-02-13 DIAGNOSIS — I10 Essential (primary) hypertension: Secondary | ICD-10-CM

## 2015-02-13 MED ORDER — LISINOPRIL 20 MG PO TABS: 20.0000 mg | ORAL_TABLET | Freq: Every day | ORAL | Status: DC

## 2015-02-13 NOTE — Progress Notes (Signed)
Name: Erik Palmer   MRN: [id number]    DOB: [birth year]   Date:02/13/2015       Progress Note  Subjective  Chief Complaint  Chief Complaint  Patient presents with  . Depression    HPI Here for f/u of HBP.    No problem-specific assessment & plan notes found for this encounter.   Past Medical History  Diagnosis Date  . Hypertension   . Prostate enlargement   . GERD (gastroesophageal reflux disease)   . Angina pectoris syndrome (HCC)   . Hypercholesteremia   . Arthritis   . Grief at loss of child 09/03/2014  . 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    History reviewed. No pertinent past surgical history.  Family History  Problem Relation Age of Onset  . Diabetes Neg Hx   . Hypertension Mother   . Heart block Mother   . Stroke Mother   . Depression Mother   . Cancer Father   . Heart attack Sister   . COPD Sister   . Hypertension Brother   . Hypertension Brother   . Hypertension Brother   . Hypertension Brother   . Heart attack Brother     Social History   Social History  . Marital Status: Married    Spouse Name: N/A  . Number of Children: N/A  . Years of Education: N/A   Occupational History  . Not on file.   Social History Main Topics  . Smoking status: Never Smoker   . Smokeless tobacco: Never Used  . Alcohol Use: No  . Drug Use: No  . Sexual Activity: Not Currently   Other Topics Concern  . Not on file   Social History Narrative     Current outpatient prescriptions:  .  acetaminophen (TYLENOL) 650 MG CR tablet, Take 650 mg by mouth daily., Disp: , Rfl:  .  ALPRAZolam (XANAX) 0.25 MG tablet, Take 1 tablet (0.25 mg total) by mouth at bedtime., Disp: 30 tablet, Rfl: 0 .  aspirin EC 81 MG tablet, Take 81 mg by mouth every morning., Disp: , Rfl:  .  atorvastatin (LIPITOR) 80 MG tablet, Take 40 mg by mouth every morning., Disp: , Rfl: 12 .  fluticasone (FLONASE) 50 MCG/ACT nasal spray, INHALE 2 SPRAYS INTO EACH NOSTRIL EVERY  DAY, Disp: 16 g, Rfl: 12 .  metoprolol (LOPRESSOR) 50 MG tablet, Take 25 mg by mouth 2 (two) times daily., Disp: , Rfl: 12 .  naproxen sodium (ANAPROX) 220 MG tablet, Take 220 mg by mouth 2 (two) times daily with a meal., Disp: , Rfl:  .  omeprazole (PRILOSEC) 20 MG capsule, Take 20 mg by mouth every morning., Disp: , Rfl: 12 .  QUEtiapine (SEROQUEL) 50 MG tablet, Take 1 tablet (50 mg total) by mouth at bedtime., Disp: 90 tablet, Rfl: 1 .  tamsulosin (FLOMAX) 0.4 MG CAPS capsule, Take 0.4 mg by mouth every evening., Disp: , Rfl: 12 .  lisinopril (PRINIVIL,ZESTRIL) 20 MG tablet, Take 1 tablet (20 mg total) by mouth daily., Disp: 30 tablet, Rfl: 6  Allergies  Allergen Reactions  . Solu-Medrol [Methylprednisolone Acetate] Anxiety     Review of Systems  Constitutional: Negative for fever, chills, weight loss and malaise/fatigue.  HENT: Negative for hearing loss.   Eyes: Negative for blurred vision and double vision.  Respiratory: Negative for cough, shortness of breath and wheezing.   Cardiovascular: Positive for leg swelling (R). Negative for chest pain and palpitations.  Gastrointestinal: Negative for heartburn,  abdominal pain and blood in stool.  Genitourinary: Negative for dysuria, urgency and frequency.  Musculoskeletal: Negative for myalgias and joint pain.  Skin: Negative for rash.  Neurological: Negative for dizziness, tremors, weakness and headaches.      Objective  Filed Vitals:   02/13/15 0828 02/13/15 0922  BP: 169/71 160/70  Pulse: 75   Temp: 97.6 F (36.4 C)   Resp: 16   Height:  (1.753 m)     Physical Exam  Constitutional: He is oriented to person, place, and time and well-developed, well-nourished, and in no distress. No distress.  HENT:  Head: Normocephalic and atraumatic.  Eyes: Conjunctivae are normal. Pupils are equal, round, and reactive to light. No scleral icterus.  Neck: Normal range of motion. Neck supple. Carotid bruit is not present. No  thyromegaly present.  Cardiovascular: Normal rate and normal heart sounds.  Exam reveals no gallop and no friction rub.   No murmur heard. Pulmonary/Chest: Effort normal and breath sounds normal. No respiratory distress. He has no wheezes. He has no rales.  Abdominal: Soft. Bowel sounds are normal. He exhibits no distension, no abdominal bruit and no mass. There is no tenderness.  Musculoskeletal: He exhibits no edema.  Lymphadenopathy:    He has no cervical adenopathy.  Neurological: He is alert and oriented to person, place, and time.       No results found for this or any previous visit (from the past 2160 hour(s)).   Assessment & Plan  Problem List Items Addressed This Visit      Cardiovascular and Mediastinum   HBP (high blood pressure) - Primary   Relevant Medications   lisinopril (PRINIVIL,ZESTRIL) 20 MG tablet      Meds ordered this encounter  Medications  . lisinopril (PRINIVIL,ZESTRIL) 20 MG tablet    Sig: Take 1 tablet (20 mg total) by mouth daily.    Dispense:  30 tablet    Refill:  6   1. Essential hypertension  - lisinopril (PRINIVIL,ZESTRIL) 20 MG tablet; Take 1 tablet (20 mg total) by mouth daily.  Dispense: 30 tablet; Refill: 6

## 2015-03-22 ENCOUNTER — Ambulatory Visit (INDEPENDENT_AMBULATORY_CARE_PROVIDER_SITE_OTHER): Payer: Medicare Other | Admitting: Psychiatry

## 2015-03-22 ENCOUNTER — Encounter: Payer: Self-pay | Admitting: Psychiatry

## 2015-03-22 VITALS — BP 122/80 | HR 86 | Temp 97.2°F | Ht 69.0 in | Wt 240.4 lb

## 2015-03-22 DIAGNOSIS — F411 Generalized anxiety disorder: Secondary | ICD-10-CM | POA: Diagnosis not present

## 2015-03-22 DIAGNOSIS — F331 Major depressive disorder, recurrent, moderate: Secondary | ICD-10-CM

## 2015-03-22 MED ORDER — QUETIAPINE FUMARATE 50 MG PO TABS: 50.0000 mg | ORAL_TABLET | Freq: Every day | ORAL | Status: DC

## 2015-03-22 NOTE — Progress Notes (Signed)
Psychiatric MD/NP Follow up Note  Patient Identification: Erik Palmer MRN:  [id number] Date of Evaluation:  03/22/2015 Referral Source: [facility] Chief Complaint:   Chief Complaint    Follow-up; Medication Refill     Visit Diagnosis:    ICD-9-CM ICD-10-CM   1. MDD (major depressive disorder), recurrent episode, moderate (HCC) 296.32 F33.1   2. GAD (generalized anxiety disorder) 300.02 F41.1    Diagnosis:   Patient Active Problem List   Diagnosis Date Noted  . HBP (high blood pressure) [I10] 02/13/2015  . Morbid obesity (HCC) [E66.01] 12/22/2014  . Medicare annual wellness visit, subsequent [Z00.00] 12/22/2014  . Acute anxiety [F41.9] 09/12/2014  . Grief at loss of child [F43.21] 09/03/2014  . Anxiety disorder due to general medical condition [F06.4] 09/03/2014  . Hyponatremia [E87.1] 09/02/2014   History of Present Illness:    Patient is a 81 year old male who presented with his wife for follow up. He  stated that he is currently doing well on his medication and has responded well to the Seroquel 50 mg. He reported that now he sleeping well and his wife reported that he does not have any side effects of the medication. Patient stated that he has been spending most of the time at home with his wife. His wife reported that he is stabilized on Seroquel and does not take any other medication. He has stopped taking alprazolam. Patient is jolly as usual and was laughing intermittently during the interview. He stated that he does not take any other medications. His mood is alert and was calm. He reported that he spends time with his wife. He reported that he is aware of his age and does not want to take any other medications. He denied having any  anger anxiety or paranoia.He reported that he is very happy with the change in medications.   His mood symptoms are under control and he appears calm and collective during the interview. He currently denied having any suicidal  homicidal ideations or plans.     Past Medical History:  Past Medical History  Diagnosis Date  . Hypertension   . Prostate enlargement   . GERD (gastroesophageal reflux disease)   . Angina pectoris syndrome (HCC)   . Hypercholesteremia   . Arthritis   . Grief at loss of child 09/03/2014  . 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   History reviewed. No pertinent past surgical history. Family History:  Family History  Problem Relation Age of Onset  . Diabetes Neg Hx   . Hypertension Mother   . Heart block Mother   . Stroke Mother   . Depression Mother   . Cancer Father   . Heart attack Sister   . COPD Sister   . Hypertension Brother   . Hypertension Brother   . Hypertension Brother   . Hypertension Brother   . Heart attack Brother    Social History:   Social History   Social History  . Marital Status: Married    Spouse Name: N/A  . Number of Children: N/A  . Years of Education: N/A   Social History Main Topics  . Smoking status: Never Smoker   . Smokeless tobacco: Never Used  . Alcohol Use: No  . Drug Use: No  . Sexual Activity: Not Currently   Other Topics Concern  . None   Social History Narrative   Additional Social History:   Currently married and lives with his wife. He had 3 children and  one of them passed away. She was in [employer] and is currently retired.  Musculoskeletal: Strength & Muscle Tone: within normal limits Gait & Station: normal Patient leans: Right  Psychiatric Specialty Exam: HPI   Review of Systems  Psychiatric/Behavioral: Positive for depression. The patient is nervous/anxious and has insomnia.   All other systems reviewed and are negative.   Blood pressure 122/80, pulse 86, temperature 97.2 F (36.2 C), temperature source Tympanic, height  (1.753 m), weight 240 lb 6.4 oz (109.045 kg), SpO2 95 %.Body mass index is 35.48 kg/(m^2).  General Appearance: Casual and Fairly Groomed  Eye Contact:  Fair   Speech:  Clear and Coherent and Normal Rate  Volume:  Normal  Mood:  Euthymic  Affect:  Congruent  Thought Process:  Coherent  Orientation:  Full (Time, Place, and Person)  Thought Content:  WDL  Suicidal Thoughts:  No  Homicidal Thoughts:  No  Memory:  Immediate;   Fair  Judgement:  Fair  Insight:  Fair  Psychomotor Activity:  Normal  Concentration:  Fair  Recall:  [employer] of Knowledge:Fair  Language: Fair  Akathisia:  No  Handed:  Right  AIMS (if indicated):    Assets:  Communication Skills Desire for Improvement Housing Intimacy Physical Health Social Support  ADL's:  Intact  Cognition: WNL  Sleep:  normal     Allergies:   Allergies  Allergen Reactions  . Solu-Medrol [Methylprednisolone Acetate] Anxiety   Current Medications: Current Outpatient Prescriptions  Medication Sig Dispense Refill  . acetaminophen (TYLENOL) 650 MG CR tablet Take 650 mg by mouth daily.    [name] ALPRAZolam (XANAX) 0.25 MG tablet Take 1 tablet (0.25 mg total) by mouth at bedtime. 30 tablet 0  . aspirin EC 81 MG tablet Take 81 mg by mouth every morning.    [name] atorvastatin (LIPITOR) 80 MG tablet Take 40 mg by mouth every morning.  12  . fluticasone (FLONASE) 50 MCG/ACT nasal spray INHALE 2 SPRAYS INTO EACH NOSTRIL EVERY DAY 16 g 12  . metoprolol (LOPRESSOR) 50 MG tablet Take 25 mg by mouth 2 (two) times daily.  12  . naproxen sodium (ANAPROX) 220 MG tablet Take 220 mg by mouth 2 (two) times daily with a meal.    . omeprazole (PRILOSEC) 20 MG capsule Take 20 mg by mouth every morning.  12  . QUEtiapine (SEROQUEL) 50 MG tablet Take 1 tablet (50 mg total) by mouth at bedtime. 90 tablet 1  . tamsulosin (FLOMAX) 0.4 MG CAPS capsule Take 0.4 mg by mouth every evening.  12  . lisinopril (PRINIVIL,ZESTRIL) 20 MG tablet Take 1 tablet (20 mg total) by mouth daily. (Patient not taking: Reported on 03/22/2015) 30 tablet 6   No current facility-administered medications for this visit.    Previous  Psychotropic Medications: Received Soulmedrol in 2014 led  to his anxiety symptoms Valium and Prozac -2014 by Dr Juanetta Gosling  Prozac again in this summer Celexa prescribed by Dr Juanetta Gosling September 04 2014- anxiety and depression.  Insomnia and takes Meclizine, Xanax and Motrin PM to help him sleep.  Dr Juanetta Gosling started Nortriptyline for sleep, did not help.   Substance Abuse History in the last 12 months:  No.  Consequences of Substance Abuse: Negative NA  Medical Decision Making:  Review of Psycho-Social Stressors (1) and Review and summation of old records (2)  Treatment Plan Summary: Medication management   Discussed with patient what the medications and he will continue on the following medications  Mood  symptoms/ sleep Continue Seroquel 50 mg by mouth daily at bedtime and was given 90 day supply of the medications Discussed with him in detail about the medication dose benefits and alternatives.      Follow-up Follow-up in 6  month or earlier    This note was generated in part or whole with voice recognition software. Voice regonition is usually quite accurate but there are transcription errors that can and very often do occur. I apologize for any typographical errors that were not detected and corrected.    Brandy Hale, MD

## 2015-05-08 ENCOUNTER — Ambulatory Visit (INDEPENDENT_AMBULATORY_CARE_PROVIDER_SITE_OTHER): Payer: Medicare Other | Admitting: Family Medicine

## 2015-05-08 ENCOUNTER — Encounter: Payer: Self-pay | Admitting: Family Medicine

## 2015-05-08 VITALS — BP 140/80 | HR 74 | Temp 98.6°F | Resp 16 | Wt 245.2 lb

## 2015-05-08 DIAGNOSIS — F419 Anxiety disorder, unspecified: Secondary | ICD-10-CM

## 2015-05-08 DIAGNOSIS — I10 Essential (primary) hypertension: Secondary | ICD-10-CM

## 2015-05-08 DIAGNOSIS — E785 Hyperlipidemia, unspecified: Secondary | ICD-10-CM | POA: Insufficient documentation

## 2015-05-08 NOTE — Progress Notes (Signed)
Name: Erik Palmer   MRN: [id number]    DOB: [birth year]   Date:05/08/2015       Progress Note  Subjective  Chief Complaint  Chief Complaint  Patient presents with  . Hypertension    Follow up    HPI Here for f/u of HBP and elevated lipids.  His anxiety is doing well on Seroquel.  He is feeling well.  Taking all meds.  BPs at home run 120-130/70-80  No problem-specific assessment & plan notes found for this encounter.   Past Medical History  Diagnosis Date  . Hypertension   . Prostate enlargement   . GERD (gastroesophageal reflux disease)   . Angina pectoris syndrome (HCC)   . Hypercholesteremia   . Arthritis   . Grief at loss of child 09/03/2014  . 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    History reviewed. No pertinent past surgical history.  Family History  Problem Relation Age of Onset  . Diabetes Neg Hx   . Hypertension Mother   . Heart block Mother   . Stroke Mother   . Depression Mother   . Cancer Father   . Heart attack Sister   . COPD Sister   . Hypertension Brother   . Hypertension Brother   . Hypertension Brother   . Hypertension Brother   . Heart attack Brother     Social History   Social History  . Marital Status: Married    Spouse Name: N/A  . Number of Children: N/A  . Years of Education: N/A   Occupational History  . Not on file.   Social History Main Topics  . Smoking status: Never Smoker   . Smokeless tobacco: Never Used  . Alcohol Use: No  . Drug Use: No  . Sexual Activity: Not Currently   Other Topics Concern  . Not on file   Social History Narrative     Current outpatient prescriptions:  .  acetaminophen (TYLENOL) 650 MG CR tablet, Take 650 mg by mouth daily. Reported on 05/08/2015, Disp: , Rfl:  .  aspirin EC 81 MG tablet, Take 81 mg by mouth every morning., Disp: , Rfl:  .  atorvastatin (LIPITOR) 80 MG tablet, Take 40 mg by mouth every morning., Disp: , Rfl: 12 .  fluticasone (FLONASE) 50 MCG/ACT  nasal spray, INHALE 2 SPRAYS INTO EACH NOSTRIL EVERY DAY, Disp: 16 g, Rfl: 12 .  lisinopril (PRINIVIL,ZESTRIL) 20 MG tablet, Take 1 tablet (20 mg total) by mouth daily. (Patient not taking: Reported on 03/22/2015), Disp: 30 tablet, Rfl: 6 .  metoprolol (LOPRESSOR) 50 MG tablet, Take 25 mg by mouth 2 (two) times daily., Disp: , Rfl: 12 .  naproxen sodium (ANAPROX) 220 MG tablet, Take 220 mg by mouth 2 (two) times daily with a meal., Disp: , Rfl:  .  omeprazole (PRILOSEC) 20 MG capsule, Take 20 mg by mouth every morning., Disp: , Rfl: 12 .  QUEtiapine (SEROQUEL) 50 MG tablet, Take 1 tablet (50 mg total) by mouth at bedtime., Disp: 90 tablet, Rfl: 1 .  tamsulosin (FLOMAX) 0.4 MG CAPS capsule, Take 0.4 mg by mouth every evening., Disp: , Rfl: 12  Allergies  Allergen Reactions  . Solu-Medrol [Methylprednisolone Acetate] Anxiety     Review of Systems  Constitutional: Negative for fever, chills, weight loss and malaise/fatigue.  HENT: Negative for hearing loss.   Eyes: Negative for blurred vision and double vision.  Respiratory: Negative for cough, shortness of breath and wheezing.   Cardiovascular:  Negative for chest pain, palpitations and leg swelling.  Gastrointestinal: Negative for heartburn, abdominal pain and blood in stool.  Genitourinary: Negative for dysuria, urgency and frequency.  Skin: Negative for rash.  Neurological: Negative for tremors, weakness and headaches.  Psychiatric/Behavioral: Negative for depression. The patient is not nervous/anxious.       Objective  Filed Vitals:   05/08/15 0829 05/08/15 0851  BP: 137/91 140/80  Pulse: 74   Temp: 98.6 F (37 C)   TempSrc: Oral   Resp: 16   Weight: 245 lb 3.2 oz (111.222 kg)     Physical Exam  Constitutional: He is oriented to person, place, and time and well-developed, well-nourished, and in no distress. No distress.  HENT:  Head: Normocephalic.  Eyes: Conjunctivae and EOM are normal. Pupils are equal, round, and  reactive to light. No scleral icterus.  Neck: Normal range of motion. Neck supple. No thyromegaly present.  Cardiovascular: Normal rate, regular rhythm and normal heart sounds.  Exam reveals no gallop and no friction rub.   No murmur heard. Pulmonary/Chest: Effort normal and breath sounds normal. No respiratory distress. He has no wheezes. He has no rales.  Abdominal: Soft. He exhibits no distension. There is no tenderness. There is no rebound.  Musculoskeletal: He exhibits no edema.  Lymphadenopathy:    He has no cervical adenopathy.  Neurological: He is alert and oriented to person, place, and time.  Vitals reviewed.      No results found for this or any previous visit (from the past 2160 hour(s)).   Assessment & Plan  Problem List Items Addressed This Visit      Cardiovascular and Mediastinum   HBP (high blood pressure) - Primary   Relevant Orders   Comprehensive Metabolic Panel (CMET)   CBC with Differential     Other   Acute anxiety   Hyperlipidemia   Relevant Orders   Lipid Profile      No orders of the defined types were placed in this encounter.   1. Essential hypertension Cont. meds - Comprehensive Metabolic Panel (CMET) - CBC with Differential  2. Hyperlipidemia Cont. meds - Lipid Profile  3. Acute anxiety Cont meds from Psych

## 2015-05-21 DIAGNOSIS — I10 Essential (primary) hypertension: Secondary | ICD-10-CM | POA: Diagnosis not present

## 2015-05-21 DIAGNOSIS — E785 Hyperlipidemia, unspecified: Secondary | ICD-10-CM | POA: Diagnosis not present

## 2015-05-22 LAB — COMPREHENSIVE METABOLIC PANEL
A/G RATIO: 2 (ref 1.2–2.2)
ALBUMIN: 4.4 g/dL (ref 3.5–4.8)
ALT: 21 IU/L (ref 0–44)
AST: 18 IU/L (ref 0–40)
Alkaline Phosphatase: 82 IU/L (ref 39–117)
BILIRUBIN TOTAL: 0.5 mg/dL (ref 0.0–1.2)
BUN / CREAT RATIO: 15 (ref 10–22)
BUN: 12 mg/dL (ref 8–27)
CALCIUM: 9.5 mg/dL (ref 8.6–10.2)
CHLORIDE: 94 mmol/L — AB (ref 96–106)
CO2: 24 mmol/L (ref 18–29)
Creatinine, Ser: 0.81 mg/dL (ref 0.76–1.27)
GFR calc Af Amer: 98 mL/min/{1.73_m2} (ref >59)
GFR, EST NON AFRICAN AMERICAN: 85 mL/min/{1.73_m2} (ref >59)
GLOBULIN, TOTAL: 2.2 g/dL (ref 1.5–4.5)
Glucose: 96 mg/dL (ref 65–99)
Potassium: 4.3 mmol/L (ref 3.5–5.2)
SODIUM: 136 mmol/L (ref 134–144)
TOTAL PROTEIN: 6.6 g/dL (ref 6.0–8.5)

## 2015-05-22 LAB — CBC WITH DIFFERENTIAL/PLATELET
BASOS ABS: 0 10*3/uL (ref 0.0–0.2)
Basos: 0 %
EOS (ABSOLUTE): 0.1 10*3/uL (ref 0.0–0.4)
Eos: 1 %
Hematocrit: 39.6 % (ref 37.5–51.0)
Hemoglobin: 14 g/dL (ref 12.6–17.7)
IMMATURE GRANS (ABS): 0 10*3/uL (ref 0.0–0.1)
Immature Granulocytes: 1 %
LYMPHS ABS: 2.3 10*3/uL (ref 0.7–3.1)
LYMPHS: 29 %
MCH: 32.5 pg (ref 26.6–33.0)
MCHC: 35.4 g/dL (ref 31.5–35.7)
MCV: 92 fL (ref 79–97)
MONOS ABS: 0.6 10*3/uL (ref 0.1–0.9)
Monocytes: 8 %
Neutrophils Absolute: 4.8 10*3/uL (ref 1.4–7.0)
Neutrophils: 61 %
PLATELETS: 300 10*3/uL (ref 150–379)
RBC: 4.31 x10E6/uL (ref 4.14–5.80)
RDW: 12.9 % (ref 12.3–15.4)
WBC: 7.8 10*3/uL (ref 3.4–10.8)

## 2015-05-22 LAB — LIPID PANEL
CHOLESTEROL TOTAL: 185 mg/dL (ref 100–199)
Chol/HDL Ratio: 4.1 ratio units (ref 0.0–5.0)
HDL: 45 mg/dL (ref >39)
LDL Calculated: 92 mg/dL (ref 0–99)
Triglycerides: 240 mg/dL — ABNORMAL HIGH (ref 0–149)
VLDL Cholesterol Cal: 48 mg/dL — ABNORMAL HIGH (ref 5–40)

## 2015-06-30 ENCOUNTER — Other Ambulatory Visit: Payer: Self-pay | Admitting: Family Medicine

## 2015-07-30 ENCOUNTER — Other Ambulatory Visit: Payer: Self-pay | Admitting: Family Medicine

## 2015-08-20 ENCOUNTER — Other Ambulatory Visit: Payer: Self-pay | Admitting: Family Medicine

## 2015-09-10 ENCOUNTER — Encounter: Payer: Self-pay | Admitting: Family Medicine

## 2015-09-10 ENCOUNTER — Ambulatory Visit (INDEPENDENT_AMBULATORY_CARE_PROVIDER_SITE_OTHER): Payer: Medicare Other | Admitting: Family Medicine

## 2015-09-10 VITALS — BP 121/76 | HR 80 | Temp 97.6°F | Resp 16 | Ht 69.0 in | Wt 243.0 lb

## 2015-09-10 DIAGNOSIS — I10 Essential (primary) hypertension: Secondary | ICD-10-CM | POA: Diagnosis not present

## 2015-09-10 DIAGNOSIS — E785 Hyperlipidemia, unspecified: Secondary | ICD-10-CM

## 2015-09-10 DIAGNOSIS — F419 Anxiety disorder, unspecified: Secondary | ICD-10-CM | POA: Diagnosis not present

## 2015-09-10 NOTE — Progress Notes (Signed)
Name: Erik Palmer   MRN: [id number]    DOB: 1934/10/19   Date:09/10/2015       Progress Note  Subjective  Chief Complaint  Chief Complaint  Patient presents with  . Hypertension    HPI Here for f/u of HBP.  BPs at home running 120-130 sys at home.  Taking meds and feeling well.  No problem-specific assessment & plan notes found for this encounter.   Past Medical History  Diagnosis Date  . Hypertension   . Prostate enlargement   . GERD (gastroesophageal reflux disease)   . Angina pectoris syndrome (HCC)   . Hypercholesteremia   . Arthritis   . Grief at loss of child 09/03/2014  . 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    History reviewed. No pertinent past surgical history.  Family History  Problem Relation Age of Onset  . Diabetes Neg Hx   . Hypertension Mother   . Heart block Mother   . Stroke Mother   . Depression Mother   . Cancer Father   . Heart attack Sister   . COPD Sister   . Hypertension Brother   . Hypertension Brother   . Hypertension Brother   . Hypertension Brother   . Heart attack Brother     Social History   Social History  . Marital Status: Married    Spouse Name: N/A  . Number of Children: N/A  . Years of Education: N/A   Occupational History  . Not on file.   Social History Main Topics  . Smoking status: Never Smoker   . Smokeless tobacco: Never Used  . Alcohol Use: No  . Drug Use: No  . Sexual Activity: Not Currently   Other Topics Concern  . Not on file   Social History Narrative     Current outpatient prescriptions:  .  acetaminophen (TYLENOL) 650 MG CR tablet, Take 650 mg by mouth daily. Reported on 05/08/2015, Disp: , Rfl:  .  aspirin EC 81 MG tablet, Take 81 mg by mouth every morning., Disp: , Rfl:  .  atorvastatin (LIPITOR) 80 MG tablet, TAKE 1 TABLET BY MOUTH EVERY DAY, Disp: 30 tablet, Rfl: 12 .  fluticasone (FLONASE) 50 MCG/ACT nasal spray, INHALE 2 SPRAYS INTO EACH NOSTRIL EVERY DAY, Disp: 16  g, Rfl: 12 .  lisinopril (PRINIVIL,ZESTRIL) 20 MG tablet, Take 1 tablet (20 mg total) by mouth daily., Disp: 30 tablet, Rfl: 6 .  metoprolol (LOPRESSOR) 50 MG tablet, TAKE 1/2 TABLET BY MOUTH TWICE DAILY, Disp: 30 tablet, Rfl: 12 .  naproxen sodium (ANAPROX) 220 MG tablet, Take 220 mg by mouth 2 (two) times daily with a meal., Disp: , Rfl:  .  omeprazole (PRILOSEC) 20 MG capsule, TAKE ONE CAPSULE BY MOUTH TWICE A DAY, Disp: 60 capsule, Rfl: 12 .  QUEtiapine (SEROQUEL) 50 MG tablet, Take 1 tablet (50 mg total) by mouth at bedtime., Disp: 90 tablet, Rfl: 1 .  tamsulosin (FLOMAX) 0.4 MG CAPS capsule, TAKE ONE CAPSULE BY MOUTH EVERY EVENING, Disp: 30 capsule, Rfl: 12  Allergies  Allergen Reactions  . Solu-Medrol [Methylprednisolone Acetate] Anxiety     Review of Systems  Constitutional: Negative for fever, chills, weight loss and malaise/fatigue.  HENT: Negative for hearing loss.   Eyes: Negative for blurred vision and double vision.  Respiratory: Negative for cough, shortness of breath and wheezing.   Cardiovascular: Negative for chest pain, palpitations and leg swelling.  Gastrointestinal: Negative for heartburn, abdominal pain and blood in stool.  Genitourinary: Negative for dysuria, urgency and frequency.  Musculoskeletal: Negative for myalgias and joint pain.  Skin: Negative for rash.  Neurological: Negative for dizziness, tremors, weakness and headaches.  Psychiatric/Behavioral: Negative for depression.      Objective  Filed Vitals:   09/10/15 0826  BP: 121/76  Pulse: 80  Temp: 97.6 F (36.4 C)  TempSrc: Oral  Resp: 16  Height: 5\' 9"  (1.753 m)  Weight: 243 lb (110.224 kg)    Physical Exam  Constitutional: He is oriented to person, place, and time and well-developed, well-nourished, and in no distress. No distress.  HENT:  Head: Normocephalic and atraumatic.  Eyes: Conjunctivae and EOM are normal. Pupils are equal, round, and reactive to light. No scleral icterus.   Neck: Normal range of motion. Neck supple. Carotid bruit is not present. No thyromegaly present.  Cardiovascular: Normal rate and regular rhythm.  Exam reveals no gallop and no friction rub.   No murmur heard. Pulmonary/Chest: Effort normal and breath sounds normal. No respiratory distress. He has no wheezes. He has no rales.  Abdominal: Soft. Bowel sounds are normal. He exhibits no distension and no mass. There is no tenderness.  Musculoskeletal: He exhibits no edema.  Lymphadenopathy:    He has no cervical adenopathy.  Neurological: He is alert and oriented to person, place, and time.  Vitals reviewed.      No results found for this or any previous visit (from the past 2160 hour(s)).   Assessment & Plan  Problem List Items Addressed This Visit      Cardiovascular and Mediastinum   HBP (high blood pressure) - Primary     Other   Acute anxiety   Hyperlipidemia      No orders of the defined types were placed in this encounter.   1. Essential hypertension cont Lisinopril and Metoprolol  2. Acute anxiety Cont to see Psych  3. Hyperlipidemia Cont. Lipitor

## 2015-09-10 NOTE — Patient Instructions (Signed)
Patient declines getting flu shot or any other immunizations.

## 2015-09-13 ENCOUNTER — Encounter: Payer: Self-pay | Admitting: Psychiatry

## 2015-09-13 ENCOUNTER — Ambulatory Visit (INDEPENDENT_AMBULATORY_CARE_PROVIDER_SITE_OTHER): Payer: Medicare Other | Admitting: Psychiatry

## 2015-09-13 VITALS — BP 122/76 | HR 87 | Temp 97.8°F | Ht 69.0 in | Wt 243.8 lb

## 2015-09-13 DIAGNOSIS — F330 Major depressive disorder, recurrent, mild: Secondary | ICD-10-CM | POA: Diagnosis not present

## 2015-09-13 MED ORDER — QUETIAPINE FUMARATE 50 MG PO TABS: 50.0000 mg | ORAL_TABLET | Freq: Every day | ORAL | Status: DC

## 2015-09-13 NOTE — Progress Notes (Signed)
Psychiatric MD/NP Follow up Note  Patient Identification: Erik Palmer MRN:  [id number] Date of Evaluation:  09/13/2015 Referral Source: [facility] Chief Complaint:   Chief Complaint    Follow-up; Medication Refill     Visit Diagnosis:    ICD-9-CM ICD-10-CM   1. MDD (major depressive disorder), recurrent episode, mild (HCC) 296.31 F33.0    Diagnosis:   Patient Active Problem List   Diagnosis Date Noted  . Hyperlipidemia [E78.5] 05/08/2015  . HBP (high blood pressure) [I10] 02/13/2015  . Morbid obesity (HCC) [E66.01] 12/22/2014  . Medicare annual wellness visit, subsequent [Z00.00] 12/22/2014  . Acute anxiety [F41.9] 09/12/2014  . Grief at loss of child [F43.21] 09/03/2014  . Anxiety disorder due to general medical condition [F06.4] 09/03/2014  . Hyponatremia [E87.1] 09/02/2014   History of Present Illness:    Patient is a 81 year old male who presented with his wife for follow up. He comes for appointment every 6 months. He reported that he has been enjoying his time and going to the beach at least 3-4 times in the past 6 months. He remains pleasant and cooperative during the interview. His wife reported that he is been sleeping well with the Seroquel and does not have any side effects. He was talking at length about different issues.  Patient is jolly as usual and was laughing intermittently during the interview. He stated that he does not take any other medications. His mood is alert and was calm. He reported that he spends time with his wife.Erik KitchenHe reported that he is very happy with the change in medications.   His mood symptoms are under control and he appears calm and collective during the interview. He currently denied having any suicidal homicidal ideations or plans.     Past Medical History:  Past Medical History  Diagnosis Date  . Hypertension   . Prostate enlargement   . GERD (gastroesophageal reflux disease)   . Angina pectoris syndrome (HCC)   .  Hypercholesteremia   . Arthritis   . Grief at loss of child 09/03/2014  . 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   History reviewed. No pertinent past surgical history. Family History:  Family History  Problem Relation Age of Onset  . Diabetes Neg Hx   . Hypertension Mother   . Heart block Mother   . Stroke Mother   . Depression Mother   . Cancer Father   . Heart attack Sister   . COPD Sister   . Hypertension Brother   . Hypertension Brother   . Hypertension Brother   . Hypertension Brother   . Heart attack Brother    Social History:   Social History   Social History  . Marital Status: Married    Spouse Name: N/A  . Number of Children: N/A  . Years of Education: N/A   Social History Main Topics  . Smoking status: Never Smoker   . Smokeless tobacco: Never Used  . Alcohol Use: No  . Drug Use: No  . Sexual Activity: Not Currently   Other Topics Concern  . None   Social History Narrative   Additional Social History:   Currently married and lives with his wife. He had 3 children and one of them passed away. She was in [employer] and is currently retired.  Musculoskeletal: Strength & Muscle Tone: within normal limits Gait & Station: normal Patient leans: Right  Psychiatric Specialty Exam: HPI  Review of Systems  Psychiatric/Behavioral: Positive for depression. The  patient is nervous/anxious and has insomnia.   All other systems reviewed and are negative.   Blood pressure 122/76, pulse 87, temperature 97.8 F (36.6 C), temperature source Tympanic, height 5\' 9"  (1.753 m), weight 243 lb 12.8 oz (110.587 kg), SpO2 95 %.Body mass index is 35.99 kg/(m^2).  General Appearance: Casual and Fairly Groomed  Eye Contact:  Fair  Speech:  Clear and Coherent and Normal Rate  Volume:  Normal  Mood:  Euthymic  Affect:  Congruent  Thought Process:  Coherent  Orientation:  Full (Time, Place, and Person)  Thought Content:  WDL  Suicidal Thoughts:  No   Homicidal Thoughts:  No  Memory:  Immediate;   Fair  Judgement:  Fair  Insight:  Fair  Psychomotor Activity:  Normal  Concentration:  Fair  Recall:  [employer] of Knowledge:Fair  Language: Fair  Akathisia:  No  Handed:  Right  AIMS (if indicated):    Assets:  Communication Skills Desire for Improvement Housing Intimacy Physical Health Social Support  ADL's:  Intact  Cognition: WNL  Sleep:  normal     Allergies:   Allergies  Allergen Reactions  . Solu-Medrol [Methylprednisolone Acetate] Anxiety   Current Medications: Current Outpatient Prescriptions  Medication Sig Dispense Refill  . acetaminophen (TYLENOL) 650 MG CR tablet Take 650 mg by mouth daily. Reported on 05/08/2015    . aspirin EC 81 MG tablet Take 81 mg by mouth every morning.    Erik Palmer atorvastatin (LIPITOR) 80 MG tablet TAKE 1 TABLET BY MOUTH EVERY DAY 30 tablet 12  . fluticasone (FLONASE) 50 MCG/ACT nasal spray INHALE 2 SPRAYS INTO EACH NOSTRIL EVERY DAY 16 g 12  . hydrochlorothiazide (HYDRODIURIL) 25 MG tablet Take 25 mg by mouth daily.    . metoprolol (LOPRESSOR) 50 MG tablet TAKE 1/2 TABLET BY MOUTH TWICE DAILY 30 tablet 12  . naproxen sodium (ANAPROX) 220 MG tablet Take 220 mg by mouth 2 (two) times daily with a meal.    . omeprazole (PRILOSEC) 20 MG capsule TAKE ONE CAPSULE BY MOUTH TWICE A DAY 60 capsule 12  . QUEtiapine (SEROQUEL) 50 MG tablet Take 1 tablet (50 mg total) by mouth at bedtime. 90 tablet 1  . tamsulosin (FLOMAX) 0.4 MG CAPS capsule TAKE ONE CAPSULE BY MOUTH EVERY EVENING 30 capsule 12  . lisinopril (PRINIVIL,ZESTRIL) 20 MG tablet Take 1 tablet (20 mg total) by mouth daily. (Patient not taking: Reported on 09/13/2015) 30 tablet 6   No current facility-administered medications for this visit.    Previous Psychotropic Medications: Received Soulmedrol in 2014 led  to his anxiety symptoms Valium and Prozac -2014 by Dr Juanetta Gosling  Prozac again in this summer Celexa prescribed by Dr Juanetta Gosling September 04 2014- anxiety and depression.  Insomnia and takes Meclizine, Xanax and Motrin PM to help him sleep.  Dr Juanetta Gosling started Nortriptyline for sleep, did not help.   Substance Abuse History in the last 12 months:  No.  Consequences of Substance Abuse: Negative NA  Medical Decision Making:  Review of Psycho-Social Stressors (1) and Review and summation of old records (2)  Treatment Plan Summary: Medication management   Discussed with patient what the medications and he will continue on the following medications  Mood symptoms/ sleep Continue Seroquel 50 mg by mouth daily at bedtime and was given 90 day supply of the medications Discussed with him in detail about the medication dose benefits and alternatives.      Follow-up Follow-up in 6  month or earlier  This note was generated in part or whole with voice recognition software. Voice regonition is usually quite accurate but there are transcription errors that can and very often do occur. I apologize for any typographical errors that were not detected and corrected.    Brandy HaleUzma Maikol Grassia, MD

## 2015-09-19 ENCOUNTER — Other Ambulatory Visit: Payer: Self-pay | Admitting: Family Medicine

## 2015-10-01 ENCOUNTER — Other Ambulatory Visit: Payer: Self-pay | Admitting: Family Medicine

## 2015-12-04 ENCOUNTER — Emergency Department: Payer: Medicare Other

## 2015-12-04 ENCOUNTER — Inpatient Hospital Stay
Admission: EM | Admit: 2015-12-04 | Discharge: 2015-12-07 | DRG: 470 | Disposition: A | Discharge status: Skilled Nursing Facility | Payer: Medicare Other | Attending: Internal Medicine | Admitting: Internal Medicine

## 2015-12-04 ENCOUNTER — Encounter: Payer: Self-pay | Admitting: Occupational Medicine

## 2015-12-04 DIAGNOSIS — M6281 Muscle weakness (generalized): Secondary | ICD-10-CM | POA: Diagnosis not present

## 2015-12-04 DIAGNOSIS — Y93H3 Activity, building and construction: Secondary | ICD-10-CM

## 2015-12-04 DIAGNOSIS — I209 Angina pectoris, unspecified: Secondary | ICD-10-CM | POA: Diagnosis not present

## 2015-12-04 DIAGNOSIS — Z96641 Presence of right artificial hip joint: Secondary | ICD-10-CM | POA: Diagnosis not present

## 2015-12-04 DIAGNOSIS — S72001A Fracture of unspecified part of neck of right femur, initial encounter for closed fracture: Secondary | ICD-10-CM | POA: Diagnosis not present

## 2015-12-04 DIAGNOSIS — Z7982 Long term (current) use of aspirin: Secondary | ICD-10-CM

## 2015-12-04 DIAGNOSIS — J309 Allergic rhinitis, unspecified: Secondary | ICD-10-CM | POA: Diagnosis not present

## 2015-12-04 DIAGNOSIS — M25551 Pain in right hip: Secondary | ICD-10-CM | POA: Diagnosis not present

## 2015-12-04 DIAGNOSIS — Z471 Aftercare following joint replacement surgery: Secondary | ICD-10-CM | POA: Diagnosis not present

## 2015-12-04 DIAGNOSIS — S0990XA Unspecified injury of head, initial encounter: Secondary | ICD-10-CM | POA: Diagnosis not present

## 2015-12-04 DIAGNOSIS — Z9181 History of falling: Secondary | ICD-10-CM | POA: Diagnosis not present

## 2015-12-04 DIAGNOSIS — S299XXA Unspecified injury of thorax, initial encounter: Secondary | ICD-10-CM | POA: Diagnosis not present

## 2015-12-04 DIAGNOSIS — S72011A Unspecified intracapsular fracture of right femur, initial encounter for closed fracture: Secondary | ICD-10-CM | POA: Diagnosis not present

## 2015-12-04 DIAGNOSIS — G8918 Other acute postprocedural pain: Secondary | ICD-10-CM

## 2015-12-04 DIAGNOSIS — Y992 Volunteer activity: Secondary | ICD-10-CM | POA: Diagnosis not present

## 2015-12-04 DIAGNOSIS — S4291XA Fracture of right shoulder girdle, part unspecified, initial encounter for closed fracture: Secondary | ICD-10-CM

## 2015-12-04 DIAGNOSIS — R262 Difficulty in walking, not elsewhere classified: Secondary | ICD-10-CM

## 2015-12-04 DIAGNOSIS — K219 Gastro-esophageal reflux disease without esophagitis: Secondary | ICD-10-CM | POA: Diagnosis not present

## 2015-12-04 DIAGNOSIS — S42301A Unspecified fracture of shaft of humerus, right arm, initial encounter for closed fracture: Secondary | ICD-10-CM | POA: Diagnosis not present

## 2015-12-04 DIAGNOSIS — E7800 Pure hypercholesterolemia, unspecified: Secondary | ICD-10-CM | POA: Diagnosis present

## 2015-12-04 DIAGNOSIS — D62 Acute posthemorrhagic anemia: Secondary | ICD-10-CM | POA: Diagnosis not present

## 2015-12-04 DIAGNOSIS — S72041A Displaced fracture of base of neck of right femur, initial encounter for closed fracture: Secondary | ICD-10-CM | POA: Diagnosis not present

## 2015-12-04 DIAGNOSIS — Z419 Encounter for procedure for purposes other than remedying health state, unspecified: Secondary | ICD-10-CM

## 2015-12-04 DIAGNOSIS — M84451A Pathological fracture, right femur, initial encounter for fracture: Secondary | ICD-10-CM | POA: Diagnosis not present

## 2015-12-04 DIAGNOSIS — S42301D Unspecified fracture of shaft of humerus, right arm, subsequent encounter for fracture with routine healing: Secondary | ICD-10-CM | POA: Diagnosis not present

## 2015-12-04 DIAGNOSIS — Z825 Family history of asthma and other chronic lower respiratory diseases: Secondary | ICD-10-CM

## 2015-12-04 DIAGNOSIS — M1611 Unilateral primary osteoarthritis, right hip: Secondary | ICD-10-CM | POA: Diagnosis not present

## 2015-12-04 DIAGNOSIS — N400 Enlarged prostate without lower urinary tract symptoms: Secondary | ICD-10-CM | POA: Diagnosis present

## 2015-12-04 DIAGNOSIS — I10 Essential (primary) hypertension: Secondary | ICD-10-CM | POA: Diagnosis present

## 2015-12-04 DIAGNOSIS — M79605 Pain in left leg: Secondary | ICD-10-CM | POA: Diagnosis not present

## 2015-12-04 DIAGNOSIS — R6889 Other general symptoms and signs: Secondary | ICD-10-CM | POA: Diagnosis not present

## 2015-12-04 DIAGNOSIS — M79606 Pain in leg, unspecified: Secondary | ICD-10-CM

## 2015-12-04 DIAGNOSIS — N358 Other urethral stricture: Secondary | ICD-10-CM | POA: Diagnosis present

## 2015-12-04 DIAGNOSIS — M25511 Pain in right shoulder: Secondary | ICD-10-CM

## 2015-12-04 DIAGNOSIS — Z79899 Other long term (current) drug therapy: Secondary | ICD-10-CM | POA: Diagnosis not present

## 2015-12-04 DIAGNOSIS — Z8249 Family history of ischemic heart disease and other diseases of the circulatory system: Secondary | ICD-10-CM | POA: Diagnosis not present

## 2015-12-04 DIAGNOSIS — Z823 Family history of stroke: Secondary | ICD-10-CM

## 2015-12-04 DIAGNOSIS — S42211A Unspecified displaced fracture of surgical neck of right humerus, initial encounter for closed fracture: Secondary | ICD-10-CM | POA: Diagnosis present

## 2015-12-04 DIAGNOSIS — Y9261 Building [any] under construction as the place of occurrence of the external cause: Secondary | ICD-10-CM

## 2015-12-04 DIAGNOSIS — S72001D Fracture of unspecified part of neck of right femur, subsequent encounter for closed fracture with routine healing: Secondary | ICD-10-CM | POA: Diagnosis not present

## 2015-12-04 DIAGNOSIS — W11XXXA Fall on and from ladder, initial encounter: Secondary | ICD-10-CM | POA: Diagnosis present

## 2015-12-04 DIAGNOSIS — Z7401 Bed confinement status: Secondary | ICD-10-CM | POA: Diagnosis not present

## 2015-12-04 LAB — COMPREHENSIVE METABOLIC PANEL
ALT: 28 U/L (ref 17–63)
AST: 37 U/L (ref 15–41)
Albumin: 4.4 g/dL (ref 3.5–5.0)
Alkaline Phosphatase: 71 U/L (ref 38–126)
Anion gap: 7 (ref 5–15)
BUN: 14 mg/dL (ref 6–20)
CO2: 27 mmol/L (ref 22–32)
Calcium: 9.3 mg/dL (ref 8.9–10.3)
Chloride: 101 mmol/L (ref 101–111)
Creatinine, Ser: 0.69 mg/dL (ref 0.61–1.24)
GFR calc Af Amer: >60 mL/min (ref >60)
GFR calc non Af Amer: >60 mL/min (ref >60)
Glucose, Bld: 125 mg/dL — ABNORMAL HIGH (ref 65–99)
Potassium: 3.2 mmol/L — ABNORMAL LOW (ref 3.5–5.1)
Sodium: 135 mmol/L (ref 135–145)
Total Bilirubin: 0.9 mg/dL (ref 0.3–1.2)
Total Protein: 7.1 g/dL (ref 6.5–8.1)

## 2015-12-04 LAB — CBC WITH DIFFERENTIAL/PLATELET
BASOS ABS: 0 10*3/uL (ref 0–0.1)
Basophils Relative: 0 %
EOS ABS: 0 10*3/uL (ref 0–0.7)
EOS PCT: 0 %
HCT: 40.4 % (ref 40.0–52.0)
Hemoglobin: 13.8 g/dL (ref 13.0–18.0)
LYMPHS PCT: 4 %
Lymphs Abs: 0.8 10*3/uL — ABNORMAL LOW (ref 1.0–3.6)
MCH: 32 pg (ref 26.0–34.0)
MCHC: 34.2 g/dL (ref 32.0–36.0)
MCV: 93.8 fL (ref 80.0–100.0)
Monocytes Absolute: 0.6 10*3/uL (ref 0.2–1.0)
Monocytes Relative: 3 %
Neutro Abs: 17.1 10*3/uL — ABNORMAL HIGH (ref 1.4–6.5)
Neutrophils Relative %: 93 %
PLATELETS: 267 10*3/uL (ref 150–440)
RBC: 4.3 MIL/uL — AB (ref 4.40–5.90)
RDW: 13.1 % (ref 11.5–14.5)
WBC: 18.5 10*3/uL — AB (ref 3.8–10.6)

## 2015-12-04 LAB — PROTIME-INR
INR: 1.14
PROTHROMBIN TIME: 14.7 s (ref 11.4–15.2)

## 2015-12-04 LAB — MRSA PCR SCREENING: MRSA BY PCR: NEGATIVE

## 2015-12-04 MED ORDER — SENNA 8.6 MG PO TABS
1.0000 | ORAL_TABLET | Freq: Two times a day (BID) | ORAL | Status: DC
  Administered 2015-12-04 – 2015-12-07 (×5): 8.6 mg via ORAL
  Filled 2015-12-04 (×5): qty 1

## 2015-12-04 MED ORDER — ACETAMINOPHEN 650 MG RE SUPP: 650.0000 mg | Freq: Four times a day (QID) | RECTAL | Status: DC | PRN

## 2015-12-04 MED ORDER — POLYETHYLENE GLYCOL 3350 17 G PO PACK
17.0000 g | PACK | Freq: Every day | ORAL | Status: DC | PRN
  Administered 2015-12-06 – 2015-12-07 (×2): 17 g via ORAL
  Filled 2015-12-04 (×2): qty 1

## 2015-12-04 MED ORDER — ALBUTEROL SULFATE (2.5 MG/3ML) 0.083% IN NEBU: 2.5000 mg | INHALATION_SOLUTION | RESPIRATORY_TRACT | Status: DC | PRN

## 2015-12-04 MED ORDER — ACETAMINOPHEN 325 MG PO TABS: 650.0000 mg | ORAL_TABLET | Freq: Four times a day (QID) | ORAL | Status: DC | PRN

## 2015-12-04 MED ORDER — PANTOPRAZOLE SODIUM 40 MG PO TBEC
40.0000 mg | DELAYED_RELEASE_TABLET | Freq: Two times a day (BID) | ORAL | Status: DC
  Administered 2015-12-04 – 2015-12-07 (×5): 40 mg via ORAL
  Filled 2015-12-04 (×5): qty 1

## 2015-12-04 MED ORDER — HYDROMORPHONE HCL 1 MG/ML IJ SOLN
INTRAMUSCULAR | Status: AC
  Administered 2015-12-04: 0.5 mg via INTRAVENOUS
  Filled 2015-12-04: qty 1

## 2015-12-04 MED ORDER — SODIUM CHLORIDE 0.9% FLUSH
3.0000 mL | Freq: Two times a day (BID) | INTRAVENOUS | Status: DC
  Administered 2015-12-05 – 2015-12-06 (×3): 3 mL via INTRAVENOUS

## 2015-12-04 MED ORDER — HYDROMORPHONE HCL 1 MG/ML IJ SOLN
0.5000 mg | INTRAMUSCULAR | Status: DC | PRN
  Administered 2015-12-04 – 2015-12-05 (×9): 0.5 mg via INTRAVENOUS
  Administered 2015-12-05: 1 mg via INTRAVENOUS
  Administered 2015-12-06 – 2015-12-07 (×6): 0.5 mg via INTRAVENOUS
  Filled 2015-12-04 (×17): qty 1

## 2015-12-04 MED ORDER — METOPROLOL TARTRATE 25 MG PO TABS
25.0000 mg | ORAL_TABLET | Freq: Two times a day (BID) | ORAL | Status: DC
  Administered 2015-12-04 – 2015-12-07 (×6): 25 mg via ORAL
  Filled 2015-12-04 (×6): qty 1

## 2015-12-04 MED ORDER — SODIUM CHLORIDE 0.9 % IV SOLN
Freq: Once | INTRAVENOUS | Status: AC
  Administered 2015-12-04: 20:00:00 via INTRAVENOUS

## 2015-12-04 MED ORDER — TAMSULOSIN HCL 0.4 MG PO CAPS
0.4000 mg | ORAL_CAPSULE | Freq: Every day | ORAL | Status: DC
  Administered 2015-12-06 – 2015-12-07 (×2): 0.4 mg via ORAL
  Filled 2015-12-04 (×2): qty 1

## 2015-12-04 MED ORDER — ONDANSETRON HCL 4 MG PO TABS: 4.0000 mg | ORAL_TABLET | Freq: Four times a day (QID) | ORAL | Status: DC | PRN

## 2015-12-04 MED ORDER — QUETIAPINE FUMARATE 25 MG PO TABS
50.0000 mg | ORAL_TABLET | Freq: Every day | ORAL | Status: DC
  Administered 2015-12-04 – 2015-12-06 (×3): 50 mg via ORAL
  Filled 2015-12-04 (×3): qty 2

## 2015-12-04 MED ORDER — HYDROCHLOROTHIAZIDE 25 MG PO TABS
25.0000 mg | ORAL_TABLET | Freq: Every day | ORAL | Status: DC
  Administered 2015-12-06 – 2015-12-07 (×2): 25 mg via ORAL
  Filled 2015-12-04 (×2): qty 1

## 2015-12-04 MED ORDER — HYDROCODONE-ACETAMINOPHEN 5-325 MG PO TABS
1.0000 | ORAL_TABLET | ORAL | Status: DC | PRN
  Administered 2015-12-05 – 2015-12-06 (×2): 2 via ORAL
  Administered 2015-12-06: 1 via ORAL
  Administered 2015-12-07 (×3): 2 via ORAL
  Filled 2015-12-04: qty 1
  Filled 2015-12-04 (×6): qty 2

## 2015-12-04 MED ORDER — HYDROMORPHONE HCL 1 MG/ML PO LIQD: 0.5000 mg | Freq: Once | ORAL | Status: DC

## 2015-12-04 MED ORDER — ONDANSETRON HCL 4 MG/2ML IJ SOLN: 4.0000 mg | Freq: Four times a day (QID) | INTRAMUSCULAR | Status: DC | PRN

## 2015-12-04 MED ORDER — HYDROMORPHONE HCL 1 MG/ML IJ SOLN
0.5000 mg | Freq: Once | INTRAMUSCULAR | Status: AC
  Administered 2015-12-04: 0.5 mg via INTRAVENOUS

## 2015-12-04 MED ORDER — SODIUM CHLORIDE 0.9% FLUSH: 3.0000 mL | INTRAVENOUS | Status: DC | PRN

## 2015-12-04 MED ORDER — FLUTICASONE PROPIONATE 50 MCG/ACT NA SUSP
1.0000 | Freq: Every day | NASAL | Status: DC
  Administered 2015-12-06: 1 via NASAL
  Filled 2015-12-04: qty 16

## 2015-12-04 MED ORDER — DOCUSATE SODIUM 100 MG PO CAPS
100.0000 mg | ORAL_CAPSULE | Freq: Two times a day (BID) | ORAL | Status: DC
  Administered 2015-12-04 – 2015-12-06 (×4): 100 mg via ORAL
  Filled 2015-12-04 (×5): qty 1

## 2015-12-04 MED ORDER — MORPHINE SULFATE (PF) 2 MG/ML IV SOLN
2.0000 mg | INTRAVENOUS | Status: DC | PRN
  Administered 2015-12-04 (×2): 2 mg via INTRAVENOUS
  Filled 2015-12-04: qty 5
  Filled 2015-12-04: qty 1

## 2015-12-04 MED ORDER — SODIUM CHLORIDE 0.9 % IV SOLN: 250.0000 mL | INTRAVENOUS | Status: DC | PRN

## 2015-12-04 MED ORDER — ONDANSETRON HCL 4 MG/2ML IJ SOLN
4.0000 mg | Freq: Once | INTRAMUSCULAR | Status: AC
  Administered 2015-12-04: 4 mg via INTRAVENOUS
  Filled 2015-12-04: qty 2

## 2015-12-04 MED ORDER — CEFAZOLIN SODIUM-DEXTROSE 2-4 GM/100ML-% IV SOLN
2.0000 g | Freq: Once | INTRAVENOUS | Status: AC
  Administered 2015-12-05: 2 g via INTRAVENOUS
  Filled 2015-12-04: qty 100

## 2015-12-04 MED ORDER — LABETALOL HCL 5 MG/ML IV SOLN
10.0000 mg | INTRAVENOUS | Status: DC | PRN
  Filled 2015-12-04: qty 4

## 2015-12-04 MED ORDER — ATORVASTATIN CALCIUM 20 MG PO TABS
40.0000 mg | ORAL_TABLET | Freq: Every day | ORAL | Status: DC
  Administered 2015-12-06 – 2015-12-07 (×2): 40 mg via ORAL
  Filled 2015-12-04 (×2): qty 2

## 2015-12-04 MED ORDER — HYDROMORPHONE HCL 1 MG/ML IJ SOLN
0.5000 mg | Freq: Once | INTRAMUSCULAR | Status: AC
  Administered 2015-12-04: 0.5 mg via INTRAVENOUS
  Filled 2015-12-04: qty 1

## 2015-12-04 NOTE — ED Notes (Signed)
Denies neck pain and back pain.

## 2015-12-04 NOTE — ED Triage Notes (Signed)
Pt presents from home painting on ladder fell 727ft no LOC denies hitting head. Pt c/o of right hip outside and right shoulder pain. Numbness tingling to right fingers. Pt states pain to 3-4/10. Pt is alert and orient x 3.

## 2015-12-04 NOTE — ED Notes (Signed)
Patient transported to XRAY and CT 

## 2015-12-04 NOTE — ED Notes (Signed)
Pt appears more comfortable now after meds and repositioning.

## 2015-12-04 NOTE — ED Notes (Signed)
Dr. Rosita KeaMenz at the bedside.

## 2015-12-04 NOTE — ED Provider Notes (Signed)
[facility] Emergency Department Provider Note   ____________________________________________   First MD Initiated Contact with Patient 12/04/15 1425     (approximate)  I have reviewed the triage vital signs and the nursing notes.   HISTORY  Chief Complaint Fall (fell 73ft no loc); Numbness (right fingers); Hip Pain (right); and Shoulder Pain (right)   HPI Erik Palmer is a 81 y.o. male who was working with [employer] was up on a ladder and fell off the ladder 7 feet. He denies hitting his head although he has an abrasion on his head. He did not pass out. He complains of pain in his right shoulder and right hip. Past medical history is as noted below. The pain is severe. It is worse if he moves. It just started.   Past Medical History:  Diagnosis Date  . Angina pectoris syndrome (HCC)   . 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 Arthritis   . GERD (gastroesophageal reflux disease)   . Grief at loss of child 09/03/2014  . Hypercholesteremia   . Hypertension   . Prostate enlargement     Patient Active Problem List   Diagnosis Date Noted  . Hyperlipidemia 05/08/2015  . HBP (high blood pressure) 02/13/2015  . Morbid obesity (HCC) 12/22/2014  . Medicare annual wellness visit, subsequent 12/22/2014  . Acute anxiety 09/12/2014  . Grief at loss of child 09/03/2014  . 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 Hyponatremia 09/02/2014    History reviewed. No pertinent surgical history.  Prior to Admission medications   Medication Sig Start Date End Date Taking? Authorizing Provider  acetaminophen (TYLENOL) 650 MG CR tablet Take 650 mg by mouth daily. Reported on 05/08/2015    Historical Provider, MD  aspirin EC 81 MG tablet Take 81 mg by mouth every morning.    Historical Provider, MD  atorvastatin (LIPITOR) 80 MG tablet TAKE 1 TABLET BY MOUTH EVERY DAY 07/02/15   Janeann Forehand., MD  fluticasone  [facility]) 50 MCG/ACT nasal spray INHALE 2 SPRAYS INTO EACH NOSTRIL EVERY DAY 09/19/15   Janeann Forehand., MD  hydrochlorothiazide (HYDRODIURIL) 25 MG tablet TAKE 1 TABLET BY MOUTH EVERY DAY 10/01/15   Janeann Forehand., MD  lisinopril (PRINIVIL,ZESTRIL) 20 MG tablet Take 1 tablet (20 mg total) by mouth daily. Patient not taking: Reported on 09/13/2015 02/13/15   Janeann Forehand., MD  metoprolol (LOPRESSOR) 50 MG tablet TAKE 1/2 TABLET BY MOUTH TWICE DAILY 08/20/15   Janeann Forehand., MD  naproxen sodium (ANAPROX) 220 MG tablet Take 220 mg by mouth 2 (two) times daily with a meal.    Historical Provider, MD  omeprazole (PRILOSEC) 20 MG capsule TAKE ONE CAPSULE BY MOUTH TWICE A DAY 07/02/15   Janeann Forehand., MD  QUEtiapine (SEROQUEL) 50 MG tablet Take 1 tablet (50 mg total) by mouth at bedtime. 09/13/15   Brandy Hale, MD  tamsulosin (FLOMAX) 0.4 MG CAPS capsule TAKE ONE CAPSULE BY MOUTH EVERY EVENING 07/31/15   Janeann Forehand., MD    Allergies Solu-medrol [methylprednisolone acetate]  Family History  Problem Relation Age of Onset  . Hypertension Mother   . Heart block Mother   . Stroke Mother   . Depression Mother   . Cancer Father   . Heart attack Sister   . COPD Sister   . Hypertension Brother   . Hypertension Brother   . Hypertension Brother   .  Hypertension Brother   . Heart attack Brother   . Diabetes Neg Hx     Social History Social History  Substance Use Topics  . Smoking status: Never Smoker  . Smokeless tobacco: Never Used  . Alcohol use No    Review of Systems Constitutional: No fever/chills Eyes: No visual changes. ENT: No sore throat. Cardiovascular: Denies chest pain. Respiratory: Denies shortness of breath. Gastrointestinal: No abdominal pain.  No nausea, no vomiting.  No diarrhea.  No constipation. Genitourinary: Negative for dysuria. Musculoskeletal: Negative for back pain. Skin: Negative for rash. Neurological: Negative for headaches, focal  weakness or numbness. Medical screening examination/treatment/procedure(s) were performed by non-physician practitioner and as supervising physician I was immediately available for consultation/collaboration.   10-point ROS otherwise negative.  ____________________________________________   PHYSICAL EXAM:  VITAL SIGNS: ED Triage Vitals  Enc Vitals Group     BP 12/04/15 1326 (!) 146/71     Pulse Rate 12/04/15 1326 74     Resp 12/04/15 1326 11     Temp 12/04/15 1326 97 F (36.1 C)     Temp Source 12/04/15 1326 Oral     SpO2 12/04/15 1326 90 %     Weight 12/04/15 1327 251 lb (113.9 kg)     Height 12/04/15 1327 5\' 9"  (1.753 m)     Head Circumference --      Peak Flow --      Pain Score 12/04/15 1328 3     Pain Loc --      Pain Edu? --      Excl. in GC? --     Constitutional: Alert and oriented. Well appearing and in no acute distress. Eyes: Conjunctivae are normal. PERRL. EOMI. Head: AtraumaticExcept for small abrasion on the forehead.. Nose: No congestion/rhinnorhea. Mouth/Throat: Mucous membranes are moist.  Oropharynx non-erythematous. Neck: No stridor. No cervical spine tenderness to palpation. Cardiovascular: Normal rate, regular rhythm. Grossly normal heart sounds.  Good peripheral circulation. Respiratory: Normal respiratory effort.  No retractions. Lungs CTAB. Gastrointestinal: Soft and nontender. No distention. No abdominal bruits. No CVA tenderness. Musculoskeletal: No lower extremity tenderness nor edema.  No joint effusions. Neurologic:  Normal speech and language. No gross focal neurologic deficits are appreciated. No gait instability. Skin:  Skin is warm, dry and intact. No rash noted. Psychiatric: Mood and affect are normal. Speech and behavior are normal.  ____________________________________________   LABS (all labs ordered are listed, but only abnormal results are displayed)  Labs Reviewed  COMPREHENSIVE METABOLIC PANEL  CBC WITH DIFFERENTIAL/PLATELET   PROTIME-INR  URINALYSIS COMPLETEWITH MICROSCOPIC ([facility] ONLY)   ____________________________________________  EKG  EKG read and interpreted by me shows normal sinus rhythm rate of 78 normal axis no acute ST-T wave changes ____________________________________________  RADIOLOGY Study Result   CLINICAL DATA:  Fall from ladder, trauma, injury  EXAM: CT HEAD WITHOUT CONTRAST  CT CERVICAL SPINE WITHOUT CONTRAST  TECHNIQUE: Multidetector CT imaging of the head and cervical spine was performed following the standard protocol without intravenous contrast. Multiplanar CT image reconstructions of the cervical spine were also generated.  COMPARISON:  11/02/2012 CT head without contrast  FINDINGS: CT HEAD FINDINGS  Brain: Age related brain atrophy pattern without acute intracranial hemorrhage, mass lesion, infarction, midline shift, herniation, hydrocephalus, or extra-axial fluid collection. Normal gray-white matter differentiation. No focal mass effect or edema. Cisterns are patent. Cerebellar atrophy as well. Atherosclerosis of the intracranial vessels noted at the skullbase.  Vascular: No hyperdense vessel or unexpected calcification.  Skull: Normal. Negative for fracture or  focal lesion.  Sinuses/Orbits: Minor scattered sinus mucosal thickening. No sinus air-fluid level or definite acute sinusitis. Mastoids remain clear.  Other: Orbits are symmetric.  CT CERVICAL SPINE FINDINGS  Alignment: Straightened cervical spine alignment but remains anatomic. Facets are aligned. No subluxation dislocation.  Skull base and vertebrae: No skull base abnormality or malalignment. Multilevel degenerative spondylosis at all levels with disc space narrowing, sclerosis and osteophytes. Intact odontoid. Degenerative changes at C1-2 articulation as well.  Soft tissues and spinal canal: Normal prevertebral soft tissues. No soft tissue asymmetry in the neck. No prevertebral  fluid or swelling. No visualize canal hematoma. Carotid atherosclerosis evident.  Upper chest: Clear lung apices. Atherosclerosis of the major branch vessels. Thyroid unremarkable.  Other: None.  IMPRESSION: Brain atrophy without acute intracranial process by noncontrast imaging. Stable exam.  Degenerative cervical spondylosis without acute osseous finding or fracture by CT.   Electronically Signed   By: Judie Petit.  Shick M.D.   On: 12/04/2015 14:23     ____________________________________________   PROCEDURES  Procedure(s) performed:  Procedures  Critical Care performed:   ____________________________________________   INITIAL IMPRESSION / ASSESSMENT AND PLAN / ED COURSE  Pertinent labs & imaging results that were available during my care of the patient were reviewed by me and considered in my medical decision making (see chart for details).    Clinical Course    Patient has a fracture of his right hip and right shoulder. CT of the head and neck are negative per radiology. Spoke with Dr. Sherrell Puller through the nurse documents is scrubbed at present. He will come down and assumed as he can ____________________________________________   FINAL CLINICAL IMPRESSION(S) / ED DIAGNOSES  Final diagnoses:  Fall from ladder  Shoulder fracture, right, closed, initial encounter  Closed fracture of right hip, initial encounter (HCC)      NEW MEDICATIONS STARTED DURING THIS VISIT:  New Prescriptions   No medications on file     Note:  This document was prepared using Dragon voice recognition software and may include unintentional dictation errors.    Arnaldo Natal, MD 12/04/15 2014

## 2015-12-04 NOTE — ED Notes (Signed)
Pt back from xray at this time.Pt in worse pain MD made aware.

## 2015-12-04 NOTE — Consult Note (Signed)
Patient is a 81 year old who was up on a ladder and fell injuring the right shoulder and hip. He was evaluated in the emergency room was found to have no other injuries. He has a nondisplaced right proximal humerus fracture and displaced femoral neck fracture. On his x-rays there is underlying osteoarthritis although he reports no significant symptoms in the past. He is very active and community ambulator with no assistive device.  On exam his right leg is shortened and externally rotated with palpable dorsalis pedis posterior tibial pulse. He is able flex extend the toes. Right arm he has been able to flex extend the fingers and has no gross deformity.  X-rays reveal fractures as noted above nondisplaced right proximal humerus, displaced femoral neck fracture with osteoarthritis  Plan: For right shoulder agrees shoulder mobilizer, will have to be nonweightbearing on the right upper extremity and this will slow down his rehabilitation from his hip surgery. Probably require rehabilitation stay.  Regarding his hip with his underlying arthritis and displaced fracture and level of activity that he clearly will desire postop my recommendation is for anterior total hip. risks, benefits possible complications discussed with him and his family

## 2015-12-04 NOTE — Progress Notes (Signed)
Pt. In severe pain. Dr. Rosita KeaMenz ordered dilaudid q1h prn

## 2015-12-04 NOTE — H&P (Signed)
[facility] - [place] at [facility]   PATIENT NAME: Erik Palmer    MR#:  [id number]  DATE OF BIRTH:  Aug 24, 1934  DATE OF ADMISSION:  12/04/2015  PRIMARY CARE PHYSICIAN: Fidel Levy, MD   REQUESTING/REFERRING PHYSICIAN: Dr. Darnelle Catalan  CHIEF COMPLAINT:   Chief Complaint  Patient presents with  . Fall    fell 86ft no loc  . Numbness    right fingers  . Hip Pain    right  . Shoulder Pain    right    HISTORY OF PRESENT ILLNESS:  Erik Palmer  is a 81 y.o. male with a known history of HTN, BPH, Depression here after falling from a 10 foot ladder. Patient landed on his right hip and shoulder. He had acute pain and on x-rays has a right hip fracture and right humerus fracture. He is being admitted for the right hip surgery. Did not hit his head. Did not lose consciousness. No chest pain. Has good functional status. No cardiac or lung problems. EKG shows nothing acute. No prior surgeries.  PAST MEDICAL HISTORY:   Past Medical History:  Diagnosis Date  . Angina pectoris syndrome (HCC)   . 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 Arthritis   . GERD (gastroesophageal reflux disease)   . Grief at loss of child 09/03/2014  . Hypercholesteremia   . Hypertension   . Prostate enlargement    PAST SURGICAL HISTORY:  History reviewed. No pertinent surgical history.  SOCIAL HISTORY:   Social History  Substance Use Topics  . Smoking status: Never Smoker  . Smokeless tobacco: Never Used  . Alcohol use No    FAMILY HISTORY:   Family History  Problem Relation Age of Onset  . Hypertension Mother   . Heart block Mother   . Stroke Mother   . Depression Mother   . Cancer Father   . Heart attack Sister   . COPD Sister   . Hypertension Brother   . Hypertension Brother   . Hypertension Brother   . Hypertension Brother   . Heart attack Brother   . Diabetes Neg Hx     DRUG ALLERGIES:   Allergies  Allergen Reactions  .  Solu-Medrol [Methylprednisolone Acetate] Anxiety    REVIEW OF SYSTEMS:   Review of Systems  Constitutional: Positive for malaise/fatigue. Negative for chills, fever and weight loss.  HENT: Negative for hearing loss and nosebleeds.   Eyes: Negative for blurred vision, double vision and pain.  Respiratory: Negative for cough, hemoptysis, sputum production, shortness of breath and wheezing.   Cardiovascular: Negative for chest pain, palpitations, orthopnea and leg swelling.  Gastrointestinal: Negative for abdominal pain, constipation, diarrhea, nausea and vomiting.  Genitourinary: Negative for dysuria and hematuria.  Musculoskeletal: Positive for joint pain. Negative for back pain, falls and myalgias.  Skin: Negative for rash.  Neurological: Negative for dizziness, tremors, sensory change, speech change, focal weakness, seizures and headaches.  Endo/Heme/Allergies: Does not bruise/bleed easily.  Psychiatric/Behavioral: Negative for depression and memory loss. The patient is not nervous/anxious.     MEDICATIONS AT HOME:   Prior to Admission medications   Medication Sig Start Date End Date Taking? Authorizing Provider  acetaminophen (TYLENOL) 650 MG CR tablet Take 650 mg by mouth daily. Reported on 05/08/2015   Yes Historical Provider, MD  aspirin EC 81 MG tablet Take 81 mg by mouth every morning.   Yes Historical Provider, MD  atorvastatin (LIPITOR) 40 MG tablet Take 40 mg by  mouth daily.   Yes Historical Provider, MD  fluticasone (FLONASE) 50 MCG/ACT nasal spray INHALE 2 SPRAYS INTO EACH NOSTRIL EVERY DAY 09/19/15  Yes Janeann Forehand., MD  hydrochlorothiazide (HYDRODIURIL) 25 MG tablet TAKE 1 TABLET BY MOUTH EVERY DAY 10/01/15  Yes Janeann Forehand., MD  metoprolol (LOPRESSOR) 50 MG tablet TAKE 1/2 TABLET BY MOUTH TWICE DAILY 08/20/15  Yes Janeann Forehand., MD  naproxen sodium (ANAPROX) 220 MG tablet Take 220 mg by mouth 4 (four) times daily.    Yes Historical Provider, MD  omeprazole  (PRILOSEC) 20 MG capsule TAKE ONE CAPSULE BY MOUTH TWICE A DAY 07/02/15  Yes Janeann Forehand., MD  QUEtiapine (SEROQUEL) 50 MG tablet Take 1 tablet (50 mg total) by mouth at bedtime. 09/13/15  Yes Brandy Hale, MD  tamsulosin (FLOMAX) 0.4 MG CAPS capsule TAKE ONE CAPSULE BY MOUTH EVERY EVENING 07/31/15  Yes Janeann Forehand., MD  lisinopril (PRINIVIL,ZESTRIL) 20 MG tablet Take 1 tablet (20 mg total) by mouth daily. Patient not taking: Reported on 12/04/2015 02/13/15   Janeann Forehand., MD     VITAL SIGNS:  Blood pressure 105/74, pulse (!) 110, temperature 97 F (36.1 C), temperature source Oral, resp. rate (!) 21, height 5\' 9"  (1.753 m), weight 113.9 kg (251 lb), SpO2 92 %.  PHYSICAL EXAMINATION:  Physical Exam  GENERAL:  81 y.o.-year-old patient lying in the bed with no acute distress.  EYES: Pupils equal, round, reactive to light and accommodation. No scleral icterus. Extraocular muscles intact.  HEENT: Head atraumatic, normocephalic. Oropharynx and nasopharynx clear. No oropharyngeal erythema, moist oral mucosa  NECK:  Supple, no jugular venous distention. No thyroid enlargement, no tenderness.  LUNGS: Normal breath sounds bilaterally, no wheezing, rales, rhonchi. No use of accessory muscles of respiration.  CARDIOVASCULAR: S1, S2 normal. No murmurs, rubs, or gallops.  ABDOMEN: Soft, nontender, nondistended. Bowel sounds present. No organomegaly or mass.  EXTREMITIES: No pedal edema, cyanosis, or clubbing. + 2 pedal & radial pulses b/l. Right hip tenderness. Shortened and Externally rotated. Right shoulder tenderness  NEUROLOGIC: Cranial nerves II through XII are intact. No focal Motor or sensory deficits appreciated b/l PSYCHIATRIC: The patient is alert and oriented x 3. Good affect.  SKIN: No obvious rash, lesion, or ulcer.   LABORATORY PANEL:   CBC  Recent Labs Lab 12/04/15 1530  WBC 18.5*  HGB 13.8  HCT 40.4  PLT 267    ------------------------------------------------------------------------------------------------------------------  Chemistries   Recent Labs Lab 12/04/15 1530  NA 135  K 3.2*  CL 101  CO2 27  GLUCOSE 125*  BUN 14  CREATININE 0.69  CALCIUM 9.3  AST 37  ALT 28  ALKPHOS 71  BILITOT 0.9   ------------------------------------------------------------------------------------------------------------------  Cardiac Enzymes No results for input(s): TROPONINI in the last 168 hours. ------------------------------------------------------------------------------------------------------------------  RADIOLOGY:  Dg Chest 1 View  Result Date: 12/04/2015 CLINICAL DATA:  Fall off ladder today. EXAM: CHEST 1 VIEW COMPARISON:  Radiographs of September 02, 2014. FINDINGS: The heart size and mediastinal contours are within normal limits. Both lungs are clear. Atherosclerosis of thoracic aorta is noted. No pneumothorax or pleural effusion is noted. The visualized skeletal structures are unremarkable. IMPRESSION: Aortic atherosclerosis.  No acute cardiopulmonary abnormality seen. Electronically Signed   By: Lupita Raider, M.D.   On: 12/04/2015 15:11   Dg Shoulder Right  Result Date: 12/04/2015 CLINICAL DATA:  Right shoulder injury and pain after 7 foot fall from a ladder today. Initial encounter. EXAM:  RIGHT SHOULDER - 2+ VIEW COMPARISON:  None. FINDINGS: The patient has a mildly impacted fracture of the surgical neck of the right humerus with mild anterior displacement. No involvement of the greater tuberosity is identified. No other fracture is seen. The humerus is located and the acromioclavicular joint is intact with moderate degenerative change seen. IMPRESSION: Mildly impacted and anteriorly displaced surgical neck fracture right humerus. Electronically Signed   By: Drusilla Kannerhomas  Dalessio M.D.   On: 12/04/2015 15:09   Ct Head Wo Contrast  Result Date: 12/04/2015 CLINICAL DATA:  Fall from ladder, trauma,  injury EXAM: CT HEAD WITHOUT CONTRAST CT CERVICAL SPINE WITHOUT CONTRAST TECHNIQUE: Multidetector CT imaging of the head and cervical spine was performed following the standard protocol without intravenous contrast. Multiplanar CT image reconstructions of the cervical spine were also generated. COMPARISON:  11/02/2012 CT head without contrast FINDINGS: CT HEAD FINDINGS Brain: Age related brain atrophy pattern without acute intracranial hemorrhage, mass lesion, infarction, midline shift, herniation, hydrocephalus, or extra-axial fluid collection. Normal gray-white matter differentiation. No focal mass effect or edema. Cisterns are patent. Cerebellar atrophy as well. Atherosclerosis of the intracranial vessels noted at the skullbase. Vascular: No hyperdense vessel or unexpected calcification. Skull: Normal. Negative for fracture or focal lesion. Sinuses/Orbits: Minor scattered sinus mucosal thickening. No sinus air-fluid level or definite acute sinusitis. Mastoids remain clear. Other: Orbits are symmetric. CT CERVICAL SPINE FINDINGS Alignment: Straightened cervical spine alignment but remains anatomic. Facets are aligned. No subluxation dislocation. Skull base and vertebrae: No skull base abnormality or malalignment. Multilevel degenerative spondylosis at all levels with disc space narrowing, sclerosis and osteophytes. Intact odontoid. Degenerative changes at C1-2 articulation as well. Soft tissues and spinal canal: Normal prevertebral soft tissues. No soft tissue asymmetry in the neck. No prevertebral fluid or swelling. No visualize canal hematoma. Carotid atherosclerosis evident. Upper chest: Clear lung apices. Atherosclerosis of the major branch vessels. Thyroid unremarkable. Other: None. IMPRESSION: Brain atrophy without acute intracranial process by noncontrast imaging. Stable exam. Degenerative cervical spondylosis without acute osseous finding or fracture by CT. Electronically Signed   By: Judie PetitM.  Shick M.D.   On:  12/04/2015 14:23   Ct Cervical Spine Wo Contrast  Result Date: 12/04/2015 CLINICAL DATA:  Fall from ladder, trauma, injury EXAM: CT HEAD WITHOUT CONTRAST CT CERVICAL SPINE WITHOUT CONTRAST TECHNIQUE: Multidetector CT imaging of the head and cervical spine was performed following the standard protocol without intravenous contrast. Multiplanar CT image reconstructions of the cervical spine were also generated. COMPARISON:  11/02/2012 CT head without contrast FINDINGS: CT HEAD FINDINGS Brain: Age related brain atrophy pattern without acute intracranial hemorrhage, mass lesion, infarction, midline shift, herniation, hydrocephalus, or extra-axial fluid collection. Normal gray-white matter differentiation. No focal mass effect or edema. Cisterns are patent. Cerebellar atrophy as well. Atherosclerosis of the intracranial vessels noted at the skullbase. Vascular: No hyperdense vessel or unexpected calcification. Skull: Normal. Negative for fracture or focal lesion. Sinuses/Orbits: Minor scattered sinus mucosal thickening. No sinus air-fluid level or definite acute sinusitis. Mastoids remain clear. Other: Orbits are symmetric. CT CERVICAL SPINE FINDINGS Alignment: Straightened cervical spine alignment but remains anatomic. Facets are aligned. No subluxation dislocation. Skull base and vertebrae: No skull base abnormality or malalignment. Multilevel degenerative spondylosis at all levels with disc space narrowing, sclerosis and osteophytes. Intact odontoid. Degenerative changes at C1-2 articulation as well. Soft tissues and spinal canal: Normal prevertebral soft tissues. No soft tissue asymmetry in the neck. No prevertebral fluid or swelling. No visualize canal hematoma. Carotid atherosclerosis evident. Upper chest: Clear lung  apices. Atherosclerosis of the major branch vessels. Thyroid unremarkable. Other: None. IMPRESSION: Brain atrophy without acute intracranial process by noncontrast imaging. Stable exam. Degenerative  cervical spondylosis without acute osseous finding or fracture by CT. Electronically Signed   By: Judie Petit.  Shick M.D.   On: 12/04/2015 14:23   Dg Hip Unilat  With Pelvis 2-3 Views Right  Result Date: 12/04/2015 CLINICAL DATA:  Fall from ladder, severe right hip pain EXAM: DG HIP (WITH OR WITHOUT PELVIS) 2-3V RIGHT COMPARISON:  12/04/2015 FINDINGS: There is an acute angulated and displaced right hip proximal/subcapital femoral neck fracture. Bones are osteopenic. No associated subluxation or dislocation. Bony pelvis and left hip appear intact. IMPRESSION: Acute right hip subcapital femoral neck fracture. Osteopenia Electronically Signed   By: Judie Petit.  Shick M.D.   On: 12/04/2015 15:10     IMPRESSION AND PLAN:   * Right hip fracture Low risk for surgery. Ordered bedrest, SCDs. Lovenox as per orthopedics. Pain medications ordered. Nothing by mouth  * Right humerus fracture Right shoulder immobilizer  * Hypertension Continue home medications   All the records are reviewed and case discussed with ED provider. Management plans discussed with the patient, family and they are in agreement.  CODE STATUS: FULL CODE  TOTAL TIME TAKING CARE OF THIS PATIENT: 40 minutes.   [name] R M.D on 12/04/2015 at 5:49 PM  Between 7am to 6pm - Pager - [phone]  After 6pm go to [email or web address] - password [email or web address] [facility]  [facility]  Office  [phone]  CC: Primary care physician; Fidel Levy, MD  Note: This dictation was prepared with Dragon dictation along with smaller phrase technology. Any transcriptional errors that result from this process are unintentional.

## 2015-12-04 NOTE — ED Notes (Signed)
Pt and wife signed consent for surgery.

## 2015-12-05 ENCOUNTER — Encounter: Payer: Self-pay | Admitting: *Deleted

## 2015-12-05 ENCOUNTER — Inpatient Hospital Stay: Payer: Medicare Other

## 2015-12-05 ENCOUNTER — Encounter
Admission: EM | Disposition: A | Discharge status: Skilled Nursing Facility | Payer: Self-pay | Source: Home / Self Care | Attending: Internal Medicine

## 2015-12-05 ENCOUNTER — Inpatient Hospital Stay: Payer: Medicare Other | Admitting: Anesthesiology

## 2015-12-05 HISTORY — PX: TOTAL HIP ARTHROPLASTY: SHX124

## 2015-12-05 LAB — CBC
HEMATOCRIT: 39.9 % — AB (ref 40.0–52.0)
HEMOGLOBIN: 13.7 g/dL (ref 13.0–18.0)
MCH: 32.4 pg (ref 26.0–34.0)
MCHC: 34.2 g/dL (ref 32.0–36.0)
MCV: 94.6 fL (ref 80.0–100.0)
Platelets: 241 10*3/uL (ref 150–440)
RBC: 4.22 MIL/uL — ABNORMAL LOW (ref 4.40–5.90)
RDW: 13.3 % (ref 11.5–14.5)
WBC: 14.2 10*3/uL — AB (ref 3.8–10.6)

## 2015-12-05 LAB — BASIC METABOLIC PANEL
ANION GAP: 8 (ref 5–15)
BUN: 17 mg/dL (ref 6–20)
CO2: 28 mmol/L (ref 22–32)
Calcium: 8.8 mg/dL — ABNORMAL LOW (ref 8.9–10.3)
Chloride: 99 mmol/L — ABNORMAL LOW (ref 101–111)
Creatinine, Ser: 0.91 mg/dL (ref 0.61–1.24)
GFR calc Af Amer: >60 mL/min (ref >60)
Glucose, Bld: 132 mg/dL — ABNORMAL HIGH (ref 65–99)
POTASSIUM: 3.5 mmol/L (ref 3.5–5.1)
SODIUM: 135 mmol/L (ref 135–145)

## 2015-12-05 SURGERY — ARTHROPLASTY, HIP, TOTAL, ANTERIOR APPROACH
Anesthesia: Spinal | Laterality: Right

## 2015-12-05 MED ORDER — BUPIVACAINE-EPINEPHRINE 0.25% -1:200000 IJ SOLN
INTRAMUSCULAR | Status: DC | PRN
  Administered 2015-12-05: 30 mL

## 2015-12-05 MED ORDER — METHOCARBAMOL 500 MG PO TABS
500.0000 mg | ORAL_TABLET | Freq: Four times a day (QID) | ORAL | Status: DC | PRN
  Administered 2015-12-06 – 2015-12-07 (×2): 500 mg via ORAL
  Filled 2015-12-05 (×2): qty 1

## 2015-12-05 MED ORDER — NEOMYCIN-POLYMYXIN B GU 40-200000 IR SOLN
Status: AC
  Filled 2015-12-05: qty 1

## 2015-12-05 MED ORDER — MAGNESIUM CITRATE PO SOLN
1.0000 | Freq: Once | ORAL | Status: AC | PRN
  Administered 2015-12-07: 1 via ORAL
  Filled 2015-12-05 (×3): qty 296

## 2015-12-05 MED ORDER — PROMETHAZINE HCL 25 MG/ML IJ SOLN
6.2500 mg | INTRAMUSCULAR | Status: DC | PRN
Start: 2015-12-05 — End: 2015-12-05

## 2015-12-05 MED ORDER — BUPIVACAINE-EPINEPHRINE (PF) 0.25% -1:200000 IJ SOLN
INTRAMUSCULAR | Status: AC
Start: 2015-12-05 — End: 2015-12-05
  Filled 2015-12-05: qty 30

## 2015-12-05 MED ORDER — MEPERIDINE HCL 25 MG/ML IJ SOLN: 6.2500 mg | INTRAMUSCULAR | Status: DC | PRN

## 2015-12-05 MED ORDER — NEOMYCIN-POLYMYXIN B GU 40-200000 IR SOLN
Status: DC | PRN
  Administered 2015-12-05: 4 mL

## 2015-12-05 MED ORDER — DEXTROSE 5 % IV SOLN
500.0000 mg | Freq: Four times a day (QID) | INTRAVENOUS | Status: DC | PRN
  Filled 2015-12-05: qty 5

## 2015-12-05 MED ORDER — FENTANYL CITRATE (PF) 100 MCG/2ML IJ SOLN
25.0000 ug | INTRAMUSCULAR | Status: DC | PRN
  Administered 2015-12-05 (×2): 50 ug via INTRAVENOUS

## 2015-12-05 MED ORDER — OXYCODONE HCL 5 MG/5ML PO SOLN: 5.0000 mg | Freq: Once | ORAL | Status: DC | PRN

## 2015-12-05 MED ORDER — MAGNESIUM HYDROXIDE 400 MG/5ML PO SUSP
30.0000 mL | Freq: Every day | ORAL | Status: DC | PRN
  Administered 2015-12-06 – 2015-12-07 (×2): 30 mL via ORAL
  Filled 2015-12-05 (×2): qty 30

## 2015-12-05 MED ORDER — BUPIVACAINE HCL (PF) 0.25 % IJ SOLN
INTRAMUSCULAR | Status: AC
  Filled 2015-12-05: qty 30

## 2015-12-05 MED ORDER — ZOLPIDEM TARTRATE 5 MG PO TABS: 5.0000 mg | ORAL_TABLET | Freq: Every evening | ORAL | Status: DC | PRN

## 2015-12-05 MED ORDER — DIPHENHYDRAMINE HCL 12.5 MG/5ML PO ELIX: 12.5000 mg | ORAL_SOLUTION | ORAL | Status: DC | PRN

## 2015-12-05 MED ORDER — SODIUM CHLORIDE 0.9 % IV SOLN
INTRAVENOUS | Status: DC | PRN
  Administered 2015-12-05: 30 ug/min via INTRAVENOUS

## 2015-12-05 MED ORDER — PHENOL 1.4 % MT LIQD
1.0000 | OROMUCOSAL | Status: DC | PRN
  Filled 2015-12-05: qty 177

## 2015-12-05 MED ORDER — BISACODYL 10 MG RE SUPP
10.0000 mg | Freq: Every day | RECTAL | Status: DC | PRN
  Filled 2015-12-05: qty 1

## 2015-12-05 MED ORDER — CEFAZOLIN SODIUM-DEXTROSE 2-4 GM/100ML-% IV SOLN
2.0000 g | Freq: Four times a day (QID) | INTRAVENOUS | Status: AC
  Administered 2015-12-05 – 2015-12-06 (×3): 2 g via INTRAVENOUS
  Filled 2015-12-05 (×3): qty 100

## 2015-12-05 MED ORDER — ALUM & MAG HYDROXIDE-SIMETH 200-200-20 MG/5ML PO SUSP: 30.0000 mL | ORAL | Status: DC | PRN

## 2015-12-05 MED ORDER — OXYCODONE HCL 5 MG PO TABS: 5.0000 mg | ORAL_TABLET | Freq: Once | ORAL | Status: DC | PRN

## 2015-12-05 MED ORDER — OXYCODONE HCL 5 MG PO TABS: 5.0000 mg | ORAL_TABLET | ORAL | Status: DC | PRN

## 2015-12-05 MED ORDER — MENTHOL 3 MG MT LOZG
1.0000 | LOZENGE | OROMUCOSAL | Status: DC | PRN
  Filled 2015-12-05: qty 9

## 2015-12-05 MED ORDER — MIDAZOLAM HCL 5 MG/5ML IJ SOLN
INTRAMUSCULAR | Status: DC | PRN
  Administered 2015-12-05: 1 mg via INTRAVENOUS

## 2015-12-05 MED ORDER — ESMOLOL HCL 100 MG/10ML IV SOLN
INTRAVENOUS | Status: DC | PRN
  Administered 2015-12-05: 50 mg via INTRAVENOUS

## 2015-12-05 MED ORDER — ACETAMINOPHEN 500 MG PO TABS
1000.0000 mg | ORAL_TABLET | Freq: Four times a day (QID) | ORAL | Status: AC
  Administered 2015-12-05 – 2015-12-06 (×3): 1000 mg via ORAL
  Filled 2015-12-05 (×4): qty 2

## 2015-12-05 MED ORDER — ENOXAPARIN SODIUM 40 MG/0.4ML ~~LOC~~ SOLN
40.0000 mg | SUBCUTANEOUS | Status: DC
  Filled 2015-12-05 (×2): qty 0.4

## 2015-12-05 MED ORDER — SODIUM CHLORIDE 0.9 % IV SOLN
INTRAVENOUS | Status: DC
Start: 2015-12-05 — End: 2015-12-06
  Administered 2015-12-05 – 2015-12-06 (×2): via INTRAVENOUS

## 2015-12-05 MED ORDER — FENTANYL CITRATE (PF) 100 MCG/2ML IJ SOLN
INTRAMUSCULAR | Status: DC | PRN
  Administered 2015-12-05 (×2): 50 ug via INTRAVENOUS

## 2015-12-05 MED ORDER — PROPOFOL 500 MG/50ML IV EMUL
INTRAVENOUS | Status: DC | PRN
  Administered 2015-12-05: 50 ug/kg/min via INTRAVENOUS

## 2015-12-05 MED ORDER — LACTATED RINGERS IV SOLN
INTRAVENOUS | Status: DC | PRN
  Administered 2015-12-05 (×2): via INTRAVENOUS

## 2015-12-05 MED ORDER — PHENYLEPHRINE HCL 10 MG/ML IJ SOLN
INTRAMUSCULAR | Status: DC | PRN
  Administered 2015-12-05 (×3): 200 ug via INTRAVENOUS
  Administered 2015-12-05: 300 ug via INTRAVENOUS
  Administered 2015-12-05 (×2): 200 ug via INTRAVENOUS

## 2015-12-05 MED ORDER — FENTANYL CITRATE (PF) 100 MCG/2ML IJ SOLN
INTRAMUSCULAR | Status: AC
  Administered 2015-12-05: 50 ug via INTRAVENOUS
  Filled 2015-12-05: qty 2

## 2015-12-05 MED ORDER — BUPIVACAINE HCL (PF) 0.5 % IJ SOLN
INTRAMUSCULAR | Status: DC | PRN
  Administered 2015-12-05: 3 mL

## 2015-12-05 SURGICAL SUPPLY — 54 items
APPLICATOR COTTON TIP 6IN STRL (MISCELLANEOUS) ×12 IMPLANT
BLADE SAW SAG 18.5X105 (BLADE) ×4 IMPLANT
BNDG COHESIVE 6X5 TAN STRL LF (GAUZE/BANDAGES/DRESSINGS) ×8 IMPLANT
CANISTER SUCT 1200ML W/VALVE (MISCELLANEOUS) ×4 IMPLANT
CAPT HIP TOTAL 3 ×2 IMPLANT
CATH COUDE FOLEY 5CC 14FR (CATHETERS) ×2 IMPLANT
CATH FOL LEG HOLDER (MISCELLANEOUS) ×4 IMPLANT
CATH FOLEY 2W COUNCIL 5CC 16FR (CATHETERS) ×2 IMPLANT
CATH FOLEY SIL 2WAY 14FR5CC (CATHETERS) ×2 IMPLANT
CATH TRAY METER 16FR LF (MISCELLANEOUS) ×4 IMPLANT
CATH URETL 7X70 OPEN END (CATHETERS) ×2 IMPLANT
CHLORAPREP W/TINT 26ML (MISCELLANEOUS) ×4 IMPLANT
DRAPE C-ARM XRAY 36X54 (DRAPES) ×4 IMPLANT
DRAPE INCISE IOBAN 66X60 STRL (DRAPES) IMPLANT
DRAPE POUCH INSTRU U-SHP 10X18 (DRAPES) ×4 IMPLANT
DRAPE SHEET LG 3/4 BI-LAMINATE (DRAPES) ×14 IMPLANT
DRAPE STERI IOBAN 125X83 (DRAPES) ×4 IMPLANT
DRAPE TABLE BACK 80X90 (DRAPES) ×4 IMPLANT
DRSG OPSITE POSTOP 4X8 (GAUZE/BANDAGES/DRESSINGS) ×10 IMPLANT
ELECT BLADE 6.5 EXT (BLADE) ×4 IMPLANT
GAUZE SPONGE 4X4 12PLY STRL (GAUZE/BANDAGES/DRESSINGS) ×4 IMPLANT
GLOVE BIOGEL PI IND STRL 9 (GLOVE) ×2 IMPLANT
GLOVE BIOGEL PI INDICATOR 9 (GLOVE) ×2
GLOVE SURG SYN 9.0  PF PI (GLOVE) ×4
GLOVE SURG SYN 9.0 PF PI (GLOVE) ×4 IMPLANT
GOWN SRG 2XL LVL 4 RGLN SLV (GOWNS) ×2 IMPLANT
GOWN STRL NON-REIN 2XL LVL4 (GOWNS) ×4
GOWN STRL REUS W/ TWL LRG LVL3 (GOWN DISPOSABLE) ×2 IMPLANT
GOWN STRL REUS W/TWL LRG LVL3 (GOWN DISPOSABLE) ×4
HEMOVAC 400CC 10FR (MISCELLANEOUS) ×4 IMPLANT
HOOD PEEL AWAY FLYTE STAYCOOL (MISCELLANEOUS) ×4 IMPLANT
MAT BLUE FLOOR 46X72 FLO (MISCELLANEOUS) ×4 IMPLANT
NDL SAFETY 18GX1.5 (NEEDLE) ×4 IMPLANT
NDL SAFETY ECLIPSE 18X1.5 (NEEDLE) IMPLANT
NDL SPNL 18GX3.5 QUINCKE PK (NEEDLE) ×2 IMPLANT
NEEDLE HYPO 18GX1.5 SHARP (NEEDLE) ×4
NEEDLE SPNL 18GX3.5 QUINCKE PK (NEEDLE) ×8 IMPLANT
NS IRRIG 1000ML POUR BTL (IV SOLUTION) ×4 IMPLANT
PACK HIP COMPR (MISCELLANEOUS) ×4 IMPLANT
SOL PREP PVP 2OZ (MISCELLANEOUS) ×4
SOLUTION PREP PVP 2OZ (MISCELLANEOUS) ×2 IMPLANT
STAPLER SKIN PROX 35W (STAPLE) ×6 IMPLANT
STRAP SAFETY BODY (MISCELLANEOUS) ×4 IMPLANT
SUT DVC 2 QUILL PDO  T11 36X36 (SUTURE) ×2
SUT DVC 2 QUILL PDO T11 36X36 (SUTURE) ×2 IMPLANT
SUT DVC QUILL MONODERM 30X30 (SUTURE) ×4 IMPLANT
SUT SILK 0 (SUTURE) ×4
SUT SILK 0 30XBRD TIE 6 (SUTURE) ×2 IMPLANT
SUT VIC AB 1 CT1 36 (SUTURE) ×4 IMPLANT
SYR 20CC LL (SYRINGE) ×6 IMPLANT
SYR 30ML LL (SYRINGE) ×6 IMPLANT
TAPE MICROFOAM 4IN (TAPE) ×4 IMPLANT
TOWEL OR 17X26 4PK STRL BLUE (TOWEL DISPOSABLE) ×4 IMPLANT
TUBE KAMVAC SUCTION (TUBING) ×4 IMPLANT

## 2015-12-05 NOTE — Clinical Social Work Note (Signed)
Clinical Social Work Assessment  Patient Details  Name: Erik Palmer MRN: [id number] Date of Birth: 01-23-1935  Date of referral:  12/05/15               Reason for consult:  Facility Placement                Permission sought to share information with:  Chartered certified accountant granted to share information::  Yes, Verbal Permission Granted  Name::      [place]::   [place]   Relationship::     Contact Information:     Housing/Transportation Living arrangements for the past 2 months:  [place] of Information:  Patient Patient Interpreter Needed:  None Criminal Activity/Legal Involvement Pertinent to Current Situation/Hospitalization:  No - Comment as needed Significant Relationships:  Adult Children, Siblings, Spouse Lives with:  Spouse Do you feel safe going back to the place where you live?  Yes Need for family participation in patient care:  Yes (Comment)  Care giving concerns:  Patient lives in [place] with his wife Erik Palmer.    Social Worker assessment / plan:  Holiday representative (CSW) received verbal consult from MD that patient is having surgery today with Dr. Rudene Christians for a hip fracture. CSW met with patient prior to surgery. Patient's 2 brothers and sister in law were at bedside. Patient was alert and oriented and was laying in the bed. CSW introduced self and explained role of CSW department. Patient reported that he lives in [place] with his wife and dog named tinker bell. CSW explained that PT will work with patient after surgery and make a recommendation about his after care. CSW explained SNF and home health option. Patient reported that he is agreeable to do whatever PT recommends. Patient is agreeable to SNF search and prefers [facility] FL2 complete and faxed out.  Patient's wife asked CSW if the [place] will cover this hospital stay. 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 Per wife they do not want to transfer to the [place] they want patient to stay at [facility]. CSW explained that patient has private insurance an [employer] that will cover hospital stay and rehab if needed. Wife verbalized her understanding.   Employment status:  Retired Nurse, adult PT Recommendations:  Not assessed at this time Information / Referral to community resources:  [place]  Patient/Family's Response to care:  Patient is agreeable to [employer] and prefers [facility] [facility]   Patient/Family's Understanding of and Emotional Response to Diagnosis, Current Treatment, and Prognosis:  Patient was pleasant and thanked CSW for assistance.   Emotional Assessment Appearance:  Appears stated age Attitude/Demeanor/Rapport:    Affect (typically observed):  Accepting, Adaptable, Pleasant Orientation:  Oriented to Self, Oriented to Place, Oriented to  Time, Oriented to Situation Alcohol / Substance use:  Not Applicable Psych involvement (Current and /or in the community):  No (Comment)  Discharge Needs  Concerns to be addressed:  Discharge Planning Concerns Readmission within the last 30 days:  No Current discharge risk:  Dependent with Mobility Barriers to Discharge:  Continued Medical Work up   [employer], Veronia Beets, LCSW 12/05/2015, 10:51 AM

## 2015-12-05 NOTE — Anesthesia Postprocedure Evaluation (Signed)
Anesthesia Post Note  Patient: Erik Palmer  Procedure(s) Performed: Procedure(s) (LRB): TOTAL HIP ARTHROPLASTY ANTERIOR APPROACH (Right) URETERAL CATHETER OR STENT PLACEMENT (N/A)  Patient location during evaluation: PACU Anesthesia Type: Spinal Level of consciousness: oriented and awake and alert Pain management: pain level controlled Vital Signs Assessment: post-procedure vital signs reviewed and stable Respiratory status: spontaneous breathing, respiratory function stable and nonlabored ventilation Cardiovascular status: blood pressure returned to baseline and stable Postop Assessment: no headache and no backache Anesthetic complications: no    Last Vitals:  Vitals:   12/05/15 1602 12/05/15 1610  BP:  122/62  Pulse: 98 99  Resp: 16 18  Temp:      Last Pain:  Vitals:   12/05/15 1602  TempSrc:   PainSc: 7                  Vincenzo Stave

## 2015-12-05 NOTE — Transfer of Care (Signed)
Immediate Anesthesia Transfer of Care Note  Patient: Erik Palmer  Procedure(s) Performed: Procedure(s) with comments: TOTAL HIP ARTHROPLASTY ANTERIOR APPROACH (Right) URETERAL CATHETER OR STENT PLACEMENT (N/A) - Dr. Apolinar JunesBrandon performed  Patient Location: PACU  Anesthesia Type:Spinal  Level of Consciousness: sedated and responds to stimulation  Airway & Oxygen Therapy: Patient Spontanous Breathing and Patient connected to face mask oxygen  Post-op Assessment: Report given to RN and Post -op Vital signs reviewed and stable  Post vital signs: Reviewed and stable  Last Vitals:  Vitals:   12/05/15 1120 12/05/15 1515  BP: 138/73 117/63  Pulse: 96 89  Resp: 18 14  Temp: 36.2 C 37.6 C    Last Pain:  Vitals:   12/05/15 1515  TempSrc:   PainSc: Asleep      Patients Stated Pain Goal: 2 (12/05/15 1120)  Complications: No apparent anesthesia complications

## 2015-12-05 NOTE — Anesthesia Procedure Notes (Signed)
Spinal  Patient location during procedure: OR Start time: 12/05/2015 12:28 PM End time: 12/05/2015 12:37 PM Reason for block: at surgeon's request Staffing Anesthesiologist: Elijio MilesVAN STAVEREN, Kenzo Ozment F Performed: anesthesiologist  Preanesthetic Checklist Completed: patient identified, site marked, surgical consent, pre-op evaluation, timeout performed, IV checked, risks and benefits discussed and monitors and equipment checked Spinal Block Patient position: right lateral decubitus Prep: Betadine Patient monitoring: heart rate and blood pressure Location: L3-4 Injection technique: single-shot Needle Needle type: Tuohy  Needle gauge: 22 G Needle length: 9 cm Needle insertion depth: 7 cm Assessment Sensory level: T8

## 2015-12-05 NOTE — OR Nursing (Signed)
K. Terrill Alperin, RN attempted to place a 91f foley, met resistance and stopped. Then re-prepped and attempted to place a 142ffoley, met resistance and stopped. Dr. MeRudene Christiansuggested a coude foley. I went to retrieve the needed supplies and consulted DiAudrie Liand she suggested a urology consult and to attempt the coude. I was unsuccessful with the coude attempt and Dr. BrErlene Quanntered OR 3 shortly after and performed a wire guided foley insertion.

## 2015-12-05 NOTE — [place] FL2 (Signed)
[place] MEDICAID FL2 LEVEL OF CARE SCREENING TOOL     IDENTIFICATION  Patient Name: Erik Palmer Birthdate: 02/10/1935 Sex: male Admission Date (Current Location): 12/04/2015  [place]   Facility and Address:  [facility], [address], [place], [place] [address]      Provider Number: [id number]  Attending Physician Name and Address:  Enedina FinnerSona Patel, MD  Relative Name and Phone Number:       Current Level of Care: Hospital Recommended Level of Care: Skilled Nursing Facility Prior Approval Number:    Date Approved/Denied:   PASRR Number:  [id number] [id number])  Discharge Plan: SNF    Current Diagnoses: Patient Active Problem List   Diagnosis Date Noted  . Closed right hip fracture, initial encounter (HCC) 12/04/2015  . Hyperlipidemia 05/08/2015  . HBP (high blood pressure) 02/13/2015  . Morbid obesity (HCC) 12/22/2014  . Medicare annual wellness visit, subsequent 12/22/2014  . Acute anxiety 09/12/2014  . Grief at loss of child 09/03/2014  . 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 Hyponatremia 09/02/2014    Orientation RESPIRATION BLADDER Height & Weight     Self, Time, Situation, Place  Normal Continent Weight: 241 lb 6.4 oz (109.5 kg) Height:  5\' 9"  (175.3 cm)  BEHAVIORAL SYMPTOMS/MOOD NEUROLOGICAL BOWEL NUTRITION STATUS   (none )  (none) Continent Diet (NPO for surgery )  AMBULATORY STATUS COMMUNICATION OF NEEDS Skin   Extensive Assist Verbally Normal                       Personal Care Assistance Level of Assistance  Bathing, Feeding, Dressing Bathing Assistance: Limited assistance Feeding assistance: Independent Dressing Assistance: Limited assistance     Functional Limitations Info  Sight, Hearing, Speech Sight Info: Adequate Hearing Info: Adequate Speech Info: Adequate    SPECIAL CARE FACTORS FREQUENCY  PT (By licensed PT), OT (By licensed OT)     PT Frequency:   (5) OT Frequency:  (5)            Contractures      Additional Factors Info  Code Status, Allergies Code Status Info:  (Full Code. ) Allergies Info:  (Solu-medrol Methylprednisolone Acetate)           Current Medications (12/05/2015):  This is the current hospital active medication list Current Facility-Administered Medications  Medication Dose Route Frequency Provider Last Rate Last Dose  . 0.9 %  sodium chloride infusion  250 mL Intravenous PRN Milagros LollSrikar [name], MD      . acetaminophen (TYLENOL) tablet 650 mg  650 mg Oral Q6H PRN Milagros LollSrikar [name], MD       Or  . acetaminophen (TYLENOL) suppository 650 mg  650 mg Rectal Q6H PRN Srikar [name], MD      . albuterol (PROVENTIL) (2.5 MG/3ML) 0.083% nebulizer solution 2.5 mg  2.5 mg Nebulization Q2H PRN Srikar [name], MD      . atorvastatin (LIPITOR) tablet 40 mg  40 mg Oral Daily Srikar [name], MD      . ceFAZolin (ANCEF) IVPB 2g/100 mL premix  2 g Intravenous Once Kennedy BuckerMichael Menz, MD      . docusate sodium (COLACE) capsule 100 mg  100 mg Oral BID Milagros LollSrikar [name], MD   100 mg at 12/04/15 2140  . fluticasone (FLONASE) 50 MCG/ACT nasal spray 1 spray  1 spray Each Nare Daily Srikar [name], MD      . hydrochlorothiazide (HYDRODIURIL) tablet 25 mg  25 mg  Oral Daily Srikar [name], MD      . HYDROcodone-acetaminophen (NORCO/VICODIN) 5-325 MG per tablet 1-2 tablet  1-2 tablet Oral Q4H PRN Srikar [name], MD      . HYDROmorphone (DILAUDID) injection 0.5 mg  0.5 mg Intravenous Q1H PRN Kennedy Bucker, MD   0.5 mg at 12/05/15 0945  . labetalol (NORMODYNE,TRANDATE) injection 10 mg  10 mg Intravenous Q4H PRN Srikar [name], MD      . metoprolol tartrate (LOPRESSOR) tablet 25 mg  25 mg Oral BID [name], MD   25 mg at 12/05/15 0945  . ondansetron (ZOFRAN) tablet 4 mg  4 mg Oral Q6H PRN [name], MD       Or  . ondansetron (ZOFRAN) injection 4 mg  4 mg Intravenous Q6H PRN Srikar [name], MD      . pantoprazole (PROTONIX) EC tablet 40 mg  40 mg Oral BID  AC Srikar [name], MD   40 mg at 12/04/15 1942  . polyethylene glycol (MIRALAX / GLYCOLAX) packet 17 g  17 g Oral Daily PRN Srikar [name], MD      . QUEtiapine (SEROQUEL) tablet 50 mg  50 mg Oral QHS [name], MD   50 mg at 12/04/15 2139  . senna (SENOKOT) tablet 8.6 mg  1 tablet Oral BID [name], MD   8.6 mg at 12/04/15 2140  . sodium chloride flush (NS) 0.9 % injection 3 mL  3 mL Intravenous Q12H [name], MD   3 mL at 12/05/15 0955  . sodium chloride flush (NS) 0.9 % injection 3 mL  3 mL Intravenous PRN Srikar [name], MD      . tamsulosin (FLOMAX) capsule 0.4 mg  0.4 mg Oral QPC breakfast [name], MD         Discharge Medications: Please see discharge summary for a list of discharge medications.  Relevant Imaging Results:  Relevant Lab Results:   Additional Information  (SSN: [id number])  Natalija Mavis, Darleen Crocker, LCSW

## 2015-12-05 NOTE — Progress Notes (Signed)
[facility] [place] [facility]   PATIENT NAME: Erik RaoWalter Palmer    MR#:  [id number]  DATE OF BIRTH:  [birth year]  SUBJECTIVE:   Came in after a fall from 10 ft ladder trying to pain his house fell and now has right hip and humerus fractures REVIEW OF SYSTEMS:   Review of Systems  Constitutional: Negative for chills, fever and weight loss.  HENT: Negative for ear discharge, ear pain and nosebleeds.   Eyes: Negative for blurred vision, pain and discharge.  Respiratory: Negative for sputum production, shortness of breath, wheezing and stridor.   Cardiovascular: Negative for chest pain, palpitations, orthopnea and PND.  Gastrointestinal: Negative for abdominal pain, diarrhea, nausea and vomiting.  Genitourinary: Negative for frequency and urgency.  Musculoskeletal: Positive for falls and joint pain. Negative for back pain.  Neurological: Positive for weakness. Negative for sensory change, speech change and focal weakness.  Psychiatric/Behavioral: Negative for depression and hallucinations. The patient is not nervous/anxious.    Tolerating Diet: Tolerating PT:   DRUG ALLERGIES:   Allergies  Allergen Reactions  . Solu-Medrol [Methylprednisolone Acetate] Anxiety    VITALS:  Blood pressure 138/73, pulse 96, temperature 97.1 F (36.2 C), temperature source Oral, resp. rate 18, height 5\' 9"  (1.753 m), weight 109.3 kg (241 lb), SpO2 93 %.  PHYSICAL EXAMINATION:   Physical Exam  GENERAL:  81 y.o.-year-old patient lying in the bed with no acute distress.  EYES: Pupils equal, round, reactive to light and accommodation. No scleral icterus. Extraocular muscles intact.  HEENT: Head atraumatic, normocephalic. Oropharynx and nasopharynx clear.  NECK:  Supple, no jugular venous distention. No thyroid enlargement, no tenderness.  LUNGS: Normal breath sounds bilaterally, no wheezing, rales, rhonchi. No use of accessory muscles of respiration.  CARDIOVASCULAR: S1, S2  normal. No murmurs, rubs, or gallops.  ABDOMEN: Soft, nontender, nondistended. Bowel sounds present. No organomegaly or mass.  EXTREMITIES: No cyanosis, clubbing or edema b/l.   Right hip decreased ROM NEUROLOGIC: Cranial nerves II through XII are intact. No focal Motor or sensory deficits b/l.   PSYCHIATRIC:  patient is alert and oriented x 3.  SKIN: No obvious rash, lesion, or ulcer.   LABORATORY PANEL:  CBC  Recent Labs Lab 12/05/15 0523  WBC 14.2*  HGB 13.7  HCT 39.9*  PLT 241    Chemistries   Recent Labs Lab 12/04/15 1530 12/05/15 0523  NA 135 135  K 3.2* 3.5  CL 101 99*  CO2 27 28  GLUCOSE 125* 132*  BUN 14 17  CREATININE 0.69 0.91  CALCIUM 9.3 8.8*  AST 37  --   ALT 28  --   ALKPHOS 71  --   BILITOT 0.9  --    Cardiac Enzymes No results for input(s): TROPONINI in the last 168 hours. RADIOLOGY:  Dg Chest 1 View  Result Date: 12/04/2015 CLINICAL DATA:  Fall off ladder today. EXAM: CHEST 1 VIEW COMPARISON:  Radiographs of September 02, 2014. FINDINGS: The heart size and mediastinal contours are within normal limits. Both lungs are clear. Atherosclerosis of thoracic aorta is noted. No pneumothorax or pleural effusion is noted. The visualized skeletal structures are unremarkable. IMPRESSION: Aortic atherosclerosis.  No acute cardiopulmonary abnormality seen. Electronically Signed   By: Lupita RaiderJames  Green Jr, M.D.   On: 12/04/2015 15:11   Dg Shoulder Right  Result Date: 12/04/2015 CLINICAL DATA:  Right shoulder injury and pain after 7 foot fall from a ladder today. Initial encounter. EXAM: RIGHT SHOULDER - 2+ VIEW COMPARISON:  None. FINDINGS: The patient has a mildly impacted fracture of the surgical neck of the right humerus with mild anterior displacement. No involvement of the greater tuberosity is identified. No other fracture is seen. The humerus is located and the acromioclavicular joint is intact with moderate degenerative change seen. IMPRESSION: Mildly impacted and  anteriorly displaced surgical neck fracture right humerus. Electronically Signed   By: Drusilla Kanner M.D.   On: 12/04/2015 15:09   Ct Head Wo Contrast  Result Date: 12/04/2015 CLINICAL DATA:  Fall from ladder, trauma, injury EXAM: CT HEAD WITHOUT CONTRAST CT CERVICAL SPINE WITHOUT CONTRAST TECHNIQUE: Multidetector CT imaging of the head and cervical spine was performed following the standard protocol without intravenous contrast. Multiplanar CT image reconstructions of the cervical spine were also generated. COMPARISON:  11/02/2012 CT head without contrast FINDINGS: CT HEAD FINDINGS Brain: Age related brain atrophy pattern without acute intracranial hemorrhage, mass lesion, infarction, midline shift, herniation, hydrocephalus, or extra-axial fluid collection. Normal gray-white matter differentiation. No focal mass effect or edema. Cisterns are patent. Cerebellar atrophy as well. Atherosclerosis of the intracranial vessels noted at the skullbase. Vascular: No hyperdense vessel or unexpected calcification. Skull: Normal. Negative for fracture or focal lesion. Sinuses/Orbits: Minor scattered sinus mucosal thickening. No sinus air-fluid level or definite acute sinusitis. Mastoids remain clear. Other: Orbits are symmetric. CT CERVICAL SPINE FINDINGS Alignment: Straightened cervical spine alignment but remains anatomic. Facets are aligned. No subluxation dislocation. Skull base and vertebrae: No skull base abnormality or malalignment. Multilevel degenerative spondylosis at all levels with disc space narrowing, sclerosis and osteophytes. Intact odontoid. Degenerative changes at C1-2 articulation as well. Soft tissues and spinal canal: Normal prevertebral soft tissues. No soft tissue asymmetry in the neck. No prevertebral fluid or swelling. No visualize canal hematoma. Carotid atherosclerosis evident. Upper chest: Clear lung apices. Atherosclerosis of the major branch vessels. Thyroid unremarkable. Other: None.  IMPRESSION: Brain atrophy without acute intracranial process by noncontrast imaging. Stable exam. Degenerative cervical spondylosis without acute osseous finding or fracture by CT. Electronically Signed   By: Judie Petit.  Shick M.D.   On: 12/04/2015 14:23   Ct Cervical Spine Wo Contrast  Result Date: 12/04/2015 CLINICAL DATA:  Fall from ladder, trauma, injury EXAM: CT HEAD WITHOUT CONTRAST CT CERVICAL SPINE WITHOUT CONTRAST TECHNIQUE: Multidetector CT imaging of the head and cervical spine was performed following the standard protocol without intravenous contrast. Multiplanar CT image reconstructions of the cervical spine were also generated. COMPARISON:  11/02/2012 CT head without contrast FINDINGS: CT HEAD FINDINGS Brain: Age related brain atrophy pattern without acute intracranial hemorrhage, mass lesion, infarction, midline shift, herniation, hydrocephalus, or extra-axial fluid collection. Normal gray-white matter differentiation. No focal mass effect or edema. Cisterns are patent. Cerebellar atrophy as well. Atherosclerosis of the intracranial vessels noted at the skullbase. Vascular: No hyperdense vessel or unexpected calcification. Skull: Normal. Negative for fracture or focal lesion. Sinuses/Orbits: Minor scattered sinus mucosal thickening. No sinus air-fluid level or definite acute sinusitis. Mastoids remain clear. Other: Orbits are symmetric. CT CERVICAL SPINE FINDINGS Alignment: Straightened cervical spine alignment but remains anatomic. Facets are aligned. No subluxation dislocation. Skull base and vertebrae: No skull base abnormality or malalignment. Multilevel degenerative spondylosis at all levels with disc space narrowing, sclerosis and osteophytes. Intact odontoid. Degenerative changes at C1-2 articulation as well. Soft tissues and spinal canal: Normal prevertebral soft tissues. No soft tissue asymmetry in the neck. No prevertebral fluid or swelling. No visualize canal hematoma. Carotid atherosclerosis  evident. Upper chest: Clear lung apices. Atherosclerosis of the major branch  vessels. Thyroid unremarkable. Other: None. IMPRESSION: Brain atrophy without acute intracranial process by noncontrast imaging. Stable exam. Degenerative cervical spondylosis without acute osseous finding or fracture by CT. Electronically Signed   By: Judie Petit.  Shick M.D.   On: 12/04/2015 14:23   Dg Hip Unilat  With Pelvis 2-3 Views Right  Result Date: 12/04/2015 CLINICAL DATA:  Fall from ladder, severe right hip pain EXAM: DG HIP (WITH OR WITHOUT PELVIS) 2-3V RIGHT COMPARISON:  12/04/2015 FINDINGS: There is an acute angulated and displaced right hip proximal/subcapital femoral neck fracture. Bones are osteopenic. No associated subluxation or dislocation. Bony pelvis and left hip appear intact. IMPRESSION: Acute right hip subcapital femoral neck fracture. Osteopenia Electronically Signed   By: Judie Petit.  Shick M.D.   On: 12/04/2015 15:10   ASSESSMENT AND PLAN:  Taurus Alamo  is a 81 y.o. male with a known history of HTN, BPH, Depression here after falling from a 10 foot ladder. Patient landed on his right hip and shoulder. He had acute pain and on x-rays has a right hip fracture and right humerus fracture  * Right hip fracture Low risk for surgery. Ordered bedrest, SCDs. Lovenox as per orthopedics. Pain medications ordered. Nothing by mouth. Pt to OR today  * Right humerus fracture Right shoulder immobilizer  * Hypertension Continue home medications  Case discussed with Care Management/Social Worker. Management plans discussed with the patient, family and they are in agreement.  CODE STATUS: FULL  DVT Prophylaxis: lovenox  TOTAL TIME TAKING CARE OF THIS PATIENT: 30 minutes.  >50% time spent on counselling and coordination of care  POSSIBLE D/C IN 1-2 DAYS, DEPENDING ON CLINICAL CONDITION.  Note: This dictation was prepared with Dragon dictation along with smaller phrase technology. Any transcriptional errors that  result from this process are unintentional.  Sharlyn Odonnel M.D on 12/05/2015 at 2:00 PM  Between 7am to 6pm - Pager - [phone]  After 6pm go to [email or web address] - password [email or web address]  [facility]  Office  [phone]  CC: Primary care physician; Fidel Levy, MD

## 2015-12-05 NOTE — Clinical Social Work Placement (Signed)
   CLINICAL SOCIAL WORK PLACEMENT  NOTE  Date:  12/05/2015  Patient Details  Name: Erik Palmer MRN: [id number] Date of Birth: 03/09/1934  Clinical Social Work is seeking post-discharge placement for this patient at the Skilled  Nursing Facility level of care (*CSW will initial, date and re-position this form in  chart as items are completed):  Yes   Patient/family provided with [facility] Clinical Social Work Department's list of facilities offering this level of care within the geographic area requested by the patient (or if unable, by the patient's family).  Yes   Patient/family informed of their freedom to choose among providers that offer the needed level of care, that participate in Medicare, Medicaid or managed care program needed by the patient, have an available bed and are willing to accept the patient.  Yes   Patient/family informed of [facility]'s ownership interest in [facility] and [facility], as well as of the fact that they are under no obligation to receive care at these facilities.  PASRR submitted to EDS on 12/05/15     PASRR number received on 12/05/15     Existing PASRR number confirmed on       FL2 transmitted to all facilities in geographic area requested by pt/family on 12/05/15     FL2 transmitted to all facilities within larger geographic area on       Patient informed that his/her managed care company has contracts with or will negotiate with certain facilities, including the following:            Patient/family informed of bed offers received.  Patient chooses bed at       Physician recommends and patient chooses bed at      Patient to be transferred to   on  .  Patient to be transferred to facility by       Patient family notified on   of transfer.  Name of family member notified:        PHYSICIAN       Additional Comment:    _______________________________________________ Orian Amberg, Darleen CrockerBailey M, LCSW 12/05/2015, 10:50 AM

## 2015-12-05 NOTE — Op Note (Signed)
12/04/2015 - 12/05/2015  3:13 PM  PATIENT:  Erik Palmer  81 y.o. male  PRE-OPERATIVE DIAGNOSIS:  right femoral neck fracture and right hip osteoarthritis  POST-OPERATIVE DIAGNOSIS:  right femoral neck fracture and right hip osteoarthritis with apparent stricture of the urethra  PROCEDURE:  Procedure(s) with comments: TOTAL HIP ARTHROPLASTY ANTERIOR APPROACH (Right) URETERAL CATHETER OR STENT PLACEMENT (N/A) - Dr. Apolinar JunesBrandon performed  SURGEON: Leitha SchullerMichael J Anureet Bruington, MD  ASSISTANTS: None  ANESTHESIA:   spinal  EBL:  Total I/O In: 1000 [I.V.:1000] Out: 850 [Urine:350; Blood:500]  BLOOD ADMINISTERED:none  DRAINS: none   LOCAL MEDICATIONS USED:  MARCAINE     SPECIMEN:  Source of Specimen:  Right femoral head and neck  DISPOSITION OF SPECIMEN:  PATHOLOGY  COUNTS:  YES  TOURNIQUET:  * No tourniquets in log *  IMPLANTS: Medacta AMIS standard 5 stem with 56 mm Mpact cup DM with liner and M 28 mm head  DICTATION: .Dragon Dictation   The patient was brought to the operating room and after spinal anesthesia was obtained fully catheter was unable be passed. The catheter was felt to be necessary to proceed with surgery of the spinal anesthesia and so Dr. Apolinar JunesBrandon urology was able to come into the operating room and placed passed the catheter the patient was then was placed on the operative table with the ipsilateral foot into the Medacta attachment, contralateral leg on a well-padded table. C-arm was brought in and preop template x-ray taken. After prepping and draping in usual sterile fashion appropriate patient identification and timeout procedures were completed. Anterior approach to the hip was obtained and centered over the greater trochanter and TFL muscle. The subcutaneous tissue was incised hemostasis being achieved by electrocautery. TFL fascia was incised and the muscle retracted laterally deep retractor placed. The lateral femoral circumflex vessels were identified and ligated. The  anterior capsule was exposed and a capsulotomy performed. The neck was identified and a femoral neck cut carried out with a saw just below the fracture. The head was removed with some difficulty and showed sclerotic femoral head and acetabulum. Reaming was carried out to 54 mm and a 56 mm cup trial gave appropriate tightness to the acetabular component a 56 DM cup was impacted into position. The leg was then externally rotated and ischiofemoral and puboofemoral releases carried out. The femur was sequentially broached to a size 5, size 5 standard stem with S head trials were placed and the final components chosen. The 5 standard stem was inserted along with a M 28 mm head and 56 mm liner. The hip was reduced and was stable the wound was thoroughly irrigated. The deep fascia was closed using a heavy Quill after infiltration of 30 cc of quarter percent Sensorcaine with epinephrine. 2-0 Quill to close the skin with skin staples. Xeroform and honeycomb dressing applied  PLAN OF CARE: Continue as inpatient

## 2015-12-05 NOTE — Care Management Note (Signed)
Case Management Note  Patient Details  Name: Erik Palmer MRN: [id number] Date of Birth: [birth year]  Subjective/Objective:                   Met with patient to discuss discharge planning prior to surgery today. He has fractured right arm and leg from falling off ladder. He is complaining of significant pain- RN notified. He states he lives with his wife. He does not have any DME at home as he was independent prior to this injury. He uses [facility] for medications  [phone].  Action/Plan: List of home health agencies left with patient. RNCM will continue to follow.   Expected Discharge Date:  12/07/15               Expected Discharge Plan:     In-House Referral:     Discharge planning Services  CM Consult  Post Acute Care Choice:  Home Health, Durable Medical Equipment Choice offered to:  Patient  DME Arranged:    DME Agency:     HH Arranged:    [place] Agency:     Status of Service:  In process, will continue to follow  If discussed at Long Length of Stay Meetings, dates discussed:    Additional Comments:  Marshell Garfinkel, RN 12/05/2015, 9:22 AM

## 2015-12-05 NOTE — Anesthesia Procedure Notes (Signed)
Performed by: Barney Gertsch Pre-anesthesia Checklist: Patient identified, Emergency Drugs available, Suction available, Patient being monitored and Timeout performed Patient Re-evaluated:Patient Re-evaluated prior to inductionOxygen Delivery Method: Simple face mask       

## 2015-12-05 NOTE — Anesthesia Preprocedure Evaluation (Signed)
Anesthesia Evaluation  Patient identified by MRN, date of birth, ID band Patient awake    Reviewed: Allergy & Precautions, NPO status   Airway Mallampati: III       Dental  (+) Teeth Intact   Pulmonary neg pulmonary ROS,     + decreased breath sounds      Cardiovascular Exercise Tolerance: Good hypertension, Pt. on medications and Pt. on home beta blockers + Past MI   Rhythm:Regular     Neuro/Psych Anxiety    GI/Hepatic Neg liver ROS, GERD  Medicated,  Endo/Other  negative endocrine ROSMorbid obesity  Renal/GU negative Renal ROS     Musculoskeletal   Abdominal (+) + obese,   Peds  Hematology negative hematology ROS (+)   Anesthesia Other Findings   Reproductive/Obstetrics                             Anesthesia Physical Anesthesia Plan  ASA: III  Anesthesia Plan: Spinal   Post-op Pain Management:    Induction:   Airway Management Planned: Natural Airway and Nasal Cannula  Additional Equipment:   Intra-op Plan:   Post-operative Plan:   Informed Consent: I have reviewed the patients History and Physical, chart, labs and discussed the procedure including the risks, benefits and alternatives for the proposed anesthesia with the patient or authorized representative who has indicated his/her understanding and acceptance.     Plan Discussed with: CRNA  Anesthesia Plan Comments:         Anesthesia Quick Evaluation

## 2015-12-05 NOTE — Op Note (Signed)
Date of procedure: 12/05/15  Preoperative diagnosis:  1. Difficult Foley   Postoperative diagnosis:  1. Fossa navicularis urethral stricture   Procedure: 1. Difficult Foley catheter placement  Surgeon: Hollice Espy, MD  Anesthesia: Spinal  Complications: None  EBL: minimal  Specimens:none  Drains: 66 Fr council tip Foley  Indication: Erik Palmer is a 81 y.o. patient hip and shoulder fracture scheduled to undergo orthopedic repair with Dr. Rudene Christians found to have a urethral stricture and difficult Foley catheter placed by the perioperative nurse. Urology was called for urgent intraoperative consult for Foley catheter placement.  Description of procedure:  The patient was in a supine position on his hospital bed with sedation and spinal anesthesia on board. He previously received Ancef. In discussion with the perioperative nurse, a 53 French Foley catheter was attempted to be placed but met resistance within a few centimeters just within the urethral meatus concerning for possible urethral stricture.  The penis was prepped using Betadine solution. Given the concern for distal urethral stricture, a sensor was advanced through the urethral meatus which advanced easily. 7 [place] open-ended ureteral catheter was then advanced over the wire without difficulty into the bladder with return of clear yellow urine. The wire was then replaced and the 7 [place] open-ended ureteral catheter was removed. A 16 French council tip catheter was then advanced over the sensor wire with a small pop within the fossa navicularis concerning for a very mild stricture but ultimately advanced easily without need for urethral dilation. Once in the bladder, the wire was removed and there was drainage of clear yellow urine. The balloon was inflated with 10 cc of sterile water.  The remainder of the procedure was completed by Dr. Rudene Christians.  Plan: Foley catheter may be removed when deemed appropriate by the primary  team.  Hollice Espy, M.D.

## 2015-12-06 ENCOUNTER — Encounter: Payer: Self-pay | Admitting: Orthopedic Surgery

## 2015-12-06 LAB — BASIC METABOLIC PANEL
ANION GAP: 8 (ref 5–15)
BUN: 20 mg/dL (ref 6–20)
CO2: 23 mmol/L (ref 22–32)
Calcium: 7.8 mg/dL — ABNORMAL LOW (ref 8.9–10.3)
Chloride: 100 mmol/L — ABNORMAL LOW (ref 101–111)
Creatinine, Ser: 0.85 mg/dL (ref 0.61–1.24)
GFR calc Af Amer: >60 mL/min (ref >60)
Glucose, Bld: 141 mg/dL — ABNORMAL HIGH (ref 65–99)
POTASSIUM: 3.3 mmol/L — AB (ref 3.5–5.1)
SODIUM: 131 mmol/L — AB (ref 135–145)

## 2015-12-06 LAB — CBC
HEMATOCRIT: 31.1 % — AB (ref 40.0–52.0)
HEMOGLOBIN: 11.2 g/dL — AB (ref 13.0–18.0)
MCH: 33.8 pg (ref 26.0–34.0)
MCHC: 36.1 g/dL — ABNORMAL HIGH (ref 32.0–36.0)
MCV: 93.6 fL (ref 80.0–100.0)
Platelets: 167 10*3/uL (ref 150–440)
RBC: 3.33 MIL/uL — ABNORMAL LOW (ref 4.40–5.90)
RDW: 13.5 % (ref 11.5–14.5)
WBC: 14.2 10*3/uL — AB (ref 3.8–10.6)

## 2015-12-06 MED ORDER — SODIUM CHLORIDE 0.9 % IV SOLN
Freq: Once | INTRAVENOUS | Status: AC
  Administered 2015-12-06: 17:00:00 via INTRAVENOUS

## 2015-12-06 MED ORDER — OXYCODONE HCL ER 15 MG PO T12A
15.0000 mg | EXTENDED_RELEASE_TABLET | Freq: Two times a day (BID) | ORAL | Status: DC
  Administered 2015-12-06 – 2015-12-07 (×3): 15 mg via ORAL
  Filled 2015-12-06 (×3): qty 1

## 2015-12-06 MED ORDER — LABETALOL HCL 5 MG/ML IV SOLN
5.0000 mg | INTRAVENOUS | Status: DC | PRN
  Filled 2015-12-06: qty 4

## 2015-12-06 NOTE — Progress Notes (Signed)
[facility] - [place] [facility]   PATIENT NAME: Erik Palmer    MR#:  [id number]  DATE OF BIRTH:  [birth year]  SUBJECTIVE:   POD#1 Anxious about PT REVIEW OF SYSTEMS:   Review of Systems  Constitutional: Negative for chills, fever and weight loss.  HENT: Negative for ear discharge, ear pain and nosebleeds.   Eyes: Negative for blurred vision, pain and discharge.  Respiratory: Negative for sputum production, shortness of breath, wheezing and stridor.   Cardiovascular: Negative for chest pain, palpitations, orthopnea and PND.  Gastrointestinal: Negative for abdominal pain, diarrhea, nausea and vomiting.  Genitourinary: Negative for frequency and urgency.  Musculoskeletal: Positive for falls and joint pain. Negative for back pain.  Neurological: Positive for weakness. Negative for sensory change, speech change and focal weakness.  Psychiatric/Behavioral: Negative for depression and hallucinations. The patient is not nervous/anxious.    Tolerating Diet:yes Tolerating PT: SNF  DRUG ALLERGIES:   Allergies  Allergen Reactions  . Solu-Medrol [Methylprednisolone Acetate] Anxiety    VITALS:  Blood pressure (!) 117/55, pulse 90, temperature 97.6 F (36.4 C), temperature source Oral, resp. rate 16, height 5\' 9"  (1.753 m), weight 115.1 kg (253 lb 11.2 oz), SpO2 (!) 86 %.  PHYSICAL EXAMINATION:   Physical Exam  GENERAL:  81 y.o.-year-old patient lying in the bed with no acute distress.  EYES: Pupils equal, round, reactive to light and accommodation. No scleral icterus. Extraocular muscles intact.  HEENT: Head atraumatic, normocephalic. Oropharynx and nasopharynx clear.  NECK:  Supple, no jugular venous distention. No thyroid enlargement, no tenderness.  LUNGS: Normal breath sounds bilaterally, no wheezing, rales, rhonchi. No use of accessory muscles of respiration.  CARDIOVASCULAR: S1, S2 normal. No murmurs, rubs, or gallops.  ABDOMEN: Soft, nontender,  nondistended. Bowel sounds present. No organomegaly or mass.  EXTREMITIES: No cyanosis, clubbing or edema b/l.   Right hip decreased ROM, right UE sling+ NEUROLOGIC: Cranial nerves II through XII are intact. No focal Motor or sensory deficits b/l.   PSYCHIATRIC:  patient is alert and oriented x 3.  SKIN: No obvious rash, lesion, or ulcer.   LABORATORY PANEL:  CBC  Recent Labs Lab 12/06/15 0514  WBC 14.2*  HGB 11.2*  HCT 31.1*  PLT 167    Chemistries   Recent Labs Lab 12/04/15 1530  12/06/15 0514  NA 135  < > 131*  K 3.2*  < > 3.3*  CL 101  < > 100*  CO2 27  < > 23  GLUCOSE 125*  < > 141*  BUN 14  < > 20  CREATININE 0.69  < > 0.85  CALCIUM 9.3  < > 7.8*  AST 37  --   --   ALT 28  --   --   ALKPHOS 71  --   --   BILITOT 0.9  --   --   < > = values in this interval not displayed. Cardiac Enzymes No results for input(s): TROPONINI in the last 168 hours. RADIOLOGY:  Dg Chest 1 View  Result Date: 12/04/2015 CLINICAL DATA:  Fall off ladder today. EXAM: CHEST 1 VIEW COMPARISON:  Radiographs of September 02, 2014. FINDINGS: The heart size and mediastinal contours are within normal limits. Both lungs are clear. Atherosclerosis of thoracic aorta is noted. No pneumothorax or pleural effusion is noted. The visualized skeletal structures are unremarkable. IMPRESSION: Aortic atherosclerosis.  No acute cardiopulmonary abnormality seen. Electronically Signed   By: Lupita Raider, M.D.   On: 12/04/2015 15:11  Dg Shoulder Right  Result Date: 12/04/2015 CLINICAL DATA:  Right shoulder injury and pain after 7 foot fall from a ladder today. Initial encounter. EXAM: RIGHT SHOULDER - 2+ VIEW COMPARISON:  None. FINDINGS: The patient has a mildly impacted fracture of the surgical neck of the right humerus with mild anterior displacement. No involvement of the greater tuberosity is identified. No other fracture is seen. The humerus is located and the acromioclavicular joint is intact with moderate  degenerative change seen. IMPRESSION: Mildly impacted and anteriorly displaced surgical neck fracture right humerus. Electronically Signed   By: Drusilla Kannerhomas  Dalessio M.D.   On: 12/04/2015 15:09   Dg Hip Operative Unilat W Or W/o Pelvis Right  Result Date: 12/05/2015 CLINICAL DATA:  RIGHT femoral neck fracture, RIGHT hip surgery EXAM: OPERATIVE RIGHT HIP (WITH PELVIS IF PERFORMED) 1 VIEWS TECHNIQUE: Fluoroscopic spot image(s) were submitted for interpretation post-operatively. COMPARISON:  Preoperative images 10 05/2015 FLUOROSCOPY TIME:  0 minutes 48 seconds Images obtained: 1 Reference air kerma:  18.9 mGy FINDINGS: Interval resection of RIGHT femoral head/neck. RIGHT hip prosthesis identified. Distal aspect of the femoral stem is not imaged. Bones demineralized. No fracture or dislocation seen. IMPRESSION: RIGHT hip replacement as above. Electronically Signed   By: Ulyses SouthwardMark  Boles M.D.   On: 12/05/2015 15:25   Dg Hip Unilat W Or W/o Pelvis 2-3 Views Right  Result Date: 12/05/2015 CLINICAL DATA:  Postop right hip replacement EXAM: DG HIP (WITH OR WITHOUT PELVIS) 2-3V RIGHT COMPARISON:  Right hip films of 12/04/2015 FINDINGS: Two views of the right hip show the right hip replacement components to be in good position. No complicating features are seen. IMPRESSION: Right total hip replacement components appear to be in good position. Electronically Signed   By: Dwyane DeePaul  Barry M.D.   On: 12/05/2015 16:00   Dg Hip Unilat  With Pelvis 2-3 Views Right  Result Date: 12/04/2015 CLINICAL DATA:  Fall from ladder, severe right hip pain EXAM: DG HIP (WITH OR WITHOUT PELVIS) 2-3V RIGHT COMPARISON:  12/04/2015 FINDINGS: There is an acute angulated and displaced right hip proximal/subcapital femoral neck fracture. Bones are osteopenic. No associated subluxation or dislocation. Bony pelvis and left hip appear intact. IMPRESSION: Acute right hip subcapital femoral neck fracture. Osteopenia Electronically Signed   By: Judie PetitM.  Shick M.D.    On: 12/04/2015 15:10   ASSESSMENT AND PLAN:  Hinton RaoWalter Baird  is a 81 y.o. male with a known history of HTN, BPH, Depression here after falling from a 10 foot ladder. Patient landed on his right hip and shoulder. He had acute pain and on x-rays has a right hip fracture and right humerus fracture  * Right hip fracture s/p surgery POD#1 Lovenox as per orthopedics. Pain medications ordered.  * Right humerus fracture Right shoulder immobilizer  * Hypertension Continue home medications  Case discussed with Care Management/Social Worker. Management plans discussed with the patient, family and they are in agreement.  CODE STATUS: FULL  DVT Prophylaxis: lovenox  TOTAL TIME TAKING CARE OF THIS PATIENT: 30 minutes.  >50% time spent on counselling and coordination of care  POSSIBLE D/C IN 1-2 DAYS, DEPENDING ON CLINICAL CONDITION.  Note: This dictation was prepared with Dragon dictation along with smaller phrase technology. Any transcriptional errors that result from this process are unintentional.  Isais Klipfel M.D on 12/06/2015 at 2:50 PM  Between 7am to 6pm - Pager - [phone]  After 6pm go to [email or web address] - password [email or web address] [facility]  [facility]  Office  [phone]  CC:  Primary care physician; Fidel Levy, MD

## 2015-12-06 NOTE — Progress Notes (Signed)
Patient's BP 107/65, HR 104. PRN Labetalol order states give if high blood pressure, HR >100. Paged MD d/t BP value. New orders for 250 cc/hr NS x1, now

## 2015-12-06 NOTE — Evaluation (Signed)
Occupational Therapy Evaluation Patient Details Name: Erik Palmer MRN: [id number] DOB: [birth year] Today's Date: 12/06/2015    History of Present Illness presented to ER status post fall from ladder with pain in R shoulder and hip, associated R non-displaced humeral fracture and displaced R femoral fracture; status post R THA, anterior approach, WBAT and application of R shoulder immobilizer (conservative management at this time), NWB   Clinical Impression   Pt is 81 year old male who fell off a ladder at home and is s/p R THA(anterior by Dr Rosita Kea)) and R non displaced humeral fracture which is in an immoblizer who lives at home with his wife. Pt was independent in all ADLs prior to surgery and is eager to return to PLOF.  Pt is currently limited in functional ADLs due to pain, decreased ROM,and needs a lot of encouraement .  Pt requires maximum assist for LB dressing and bathing skills due to pain and decreased AROM of R LE and RUE immobilized and would benefit from continued skilled OT services for education in assistive devices, functional mobility, and education in recommendations for home modifications to increase safety and prevent falls.  Pt is a good candidate for SNF to continue rehabilitation.      Follow Up Recommendations  SNF    Equipment Recommendations  3 in 1 bedside comode;Tub/shower bench    Recommendations for Other Services       Precautions / Restrictions Precautions Precautions: Fall Restrictions Weight Bearing Restrictions: Yes RUE Weight Bearing: Non weight bearing RLE Weight Bearing: Weight bearing as tolerated Other Position/Activity Restrictions: R shoulder sling/immobilizer      Mobility Bed Mobility Overal bed mobility: Needs Assistance;+2 for physical assistance Bed Mobility: Supine to Sit     Supine to sit: Total assist;+2 for physical assistance Sit to supine: Total assist;+2 for physical assistance   General bed mobility comments: very  limited active participation with movement transition, yelling "stop" and resisting further movement with very minimal movement of R LE.  Constant encouragement, dependant assist +2 required to complete  Transfers Overall transfer level: Needs assistance Equipment used: Hemi-walker Transfers: Sit to/from Stand Sit to Stand: Min assist;Mod assist;+2 physical assistance;From elevated surface         General transfer comment: maintains R LE in abducted position throughout transition.  Able to slide/scoot feet into more normalized BOS once standing, but unable to unweight/advance for formal stepping    Balance Overall balance assessment: Needs assistance Sitting-balance support: No upper extremity supported;Feet supported Sitting balance-Leahy Scale: Good     Standing balance support: Single extremity supported Standing balance-Leahy Scale: Fair                              ADL Overall ADL's : Needs assistance/impaired Eating/Feeding: Independent;Set up   Grooming: Wash/dry hands;Wash/dry face;Brushing hair;Minimal assistance Grooming Details (indicate cue type and reason): not able to use RUE due to immoblizer, able to hold toothbrush and paste in hand         Upper Body Dressing : Maximal assistance;Set up;Sitting   Lower Body Dressing: Total assistance;+2 for physical assistance Lower Body Dressing Details (indicate cue type and reason): sitting EOB and concerned about safety leaning forward due to being on very edge of bed.  Demonstrated use of AD and he attempted to use reacher iwth L hand and became very frustrated since he is R hand dominant.  He was putting a lot of weight through RLE  to stay upright on EOB which limited LB dressing skills session due to safety.                     Vision     Perception     Praxis      Pertinent Vitals/Pain Pain Assessment: 0-10 Pain Score: 4  Faces Pain Scale: Hurts even more Pain Location: R hip Pain  Descriptors / Indicators: Aching;Grimacing;Operative site guarding Pain Intervention(s): Limited activity within patient's tolerance;Monitored during session;Repositioned;Premedicated before session     Hand Dominance Right   Extremity/Trunk Assessment Upper Extremity Assessment Upper Extremity Assessment: RUE deficits/detail RUE Deficits / Details: in immobilizer and NWB RUE--intact sensation and able to wiggle fingers with full active ROM R wrist flexion and extension   Lower Extremity Assessment Lower Extremity Assessment: Defer to PT evaluation       Communication Communication Communication: HOH   Cognition Arousal/Alertness: Awake/alert Behavior During Therapy: [facility] for tasks assessed/performed;Agitated;Anxious Overall Cognitive Status: Within Functional Limits for tasks assessed                     General Comments       Exercises   Other Exercises Other Exercises: Supine R LE therex, 1x10, act assist ROM: ankle pumps, quad sets, SAQs, hip abduct/adduct and heel slides.  Very limited range tolerated, guarded due to pain. Other Exercises: Additional sit/stand from progressively lowered bed surface, min/mod assist +2 with hemi-walker. Unable to tolerate transition from bed surface at standard height at this time.   Shoulder Instructions      Home Living Family/patient expects to be discharged to:: Private residence Living Arrangements: Spouse/significant other Available Help at Discharge: Family Type of Home: House Home Access: Stairs to enter Secretary/administratorntrance Stairs-Number of Steps: 1 Entrance Stairs-Rails: None Home Layout: One level     Bathroom Shower/Tub: Tub/shower unit Shower/tub characteristics: Engineer, building servicesCurtain Bathroom Toilet: Standard     Home Equipment: None          Prior Functioning/Environment Level of Independence: Independent        Comments: Indep with ADLs, household and community mobility        OT Problem List: Decreased  strength;Decreased range of motion;Decreased activity tolerance;Decreased knowledge of use of DME or AE;Pain   OT Treatment/Interventions: Self-care/ADL training;Patient/family education;DME and/or AE instruction    OT Goals(Current goals can be found in the care plan section) Acute Rehab OT Goals Patient Stated Goal: "I like to do things my own way" OT Goal Formulation: With patient/family Time For Goal Achievement: 12/20/15 Potential to Achieve Goals: Good ADL Goals Pt Will Perform Lower Body Dressing: with mod assist;with adaptive equipment;sit to/from stand  OT Frequency: Min 1X/week   Barriers to D/C:            Co-evaluation              End of Session    Activity Tolerance: Patient limited by pain Patient left: in bed;with call bell/phone within reach;with bed alarm set;with family/visitor present   Time: [id number] OT Time Calculation (min): 25 min Charges:  OT General Charges $OT Visit: 1 Procedure OT Evaluation $OT Eval High Complexity: 1 Procedure OT Treatments $Self Care/Home Management : 8-22 mins G-Codes:     Susanne BordersSusan Wofford, OTR/L ascom [phone] 12/06/15, 4:07 PM

## 2015-12-06 NOTE — Progress Notes (Signed)
Pt. Heart rate up to 175's. Dr.Diamond notified and ordered labatalol.

## 2015-12-06 NOTE — Progress Notes (Cosign Needed)
PT is recommending SNF. Social work intern met with patient at bedside and presented bed offers. Patient chose [facility]. Michelle admissions coordinotor at [facility] is aware of above. Social work intern will continue to assist and follow as needed.    , Social Work Intern [phone] 

## 2015-12-06 NOTE — Progress Notes (Addendum)
Patient is A&O x4. UP with a strong assist x 2. Urinated via urinal at bedside. Honeycomb dressing is intact to right hip. Right arm in immobilizer. Bed/chair alarm in place for safety. IV fluids discontinue this shift along with tele. Poor appetite . Wife at bedside most of the day. Needs strong encouragement to eat and move in bed.

## 2015-12-06 NOTE — H&P (Signed)
[facility] His PCP is civilian provider Dr. Juanetta GoslingHawkins. [facility] under Medicare [employer]. I have faxed [facility] forms and placed copies on patient  Chart.

## 2015-12-06 NOTE — Progress Notes (Signed)
   Subjective: 1 Day Post-Op Procedure(s) (LRB): TOTAL HIP ARTHROPLASTY ANTERIOR APPROACH (Right) URETERAL CATHETER OR STENT PLACEMENT (N/A) Patient reports pain as 5 on 0-10 scale.   Patient is well, and has had no acute complaints or problems Denies any CP, SOB, ABD pain. We will continue therapy today.  Plan is to go Rehab after hospital stay.  Objective: Vital signs in last 24 hours: Temp:  [97.1 F (36.2 C)-99.7 F (37.6 C)] 98 F (36.7 C) (10/05 0500) Pulse Rate:  [89-101] 91 (10/04 2357) Resp:  [12-20] 18 (10/05 0500) BP: (114-148)/(60-73) 140/65 (10/05 0500) SpO2:  [87 %-100 %] 92 % (10/05 0500) Weight:  [109.3 kg (241 lb)-115.1 kg (253 lb 11.2 oz)] 115.1 kg (253 lb 11.2 oz) (10/05 0500)  Intake/Output from previous day: 10/04 0701 - 10/05 0700 In: 2636.7 [I.V.:2636.7] Out: 1535 [Urine:785; Blood:750] Intake/Output this shift: No intake/output data recorded.   Recent Labs  12/04/15 1530 12/05/15 0523 12/06/15 0514  HGB 13.8 13.7 11.2*    Recent Labs  12/05/15 0523 12/06/15 0514  WBC 14.2* 14.2*  RBC 4.22* 3.33*  HCT 39.9* 31.1*  PLT 241 167    Recent Labs  12/05/15 0523 12/06/15 0514  NA 135 131*  K 3.5 3.3*  CL 99* 100*  CO2 28 23  BUN 17 20  CREATININE 0.91 0.85  GLUCOSE 132* 141*  CALCIUM 8.8* 7.8*    Recent Labs  12/04/15 1530  INR 1.14    EXAM General - Patient is Alert, Appropriate and Oriented Extremity - Neurovascular intact Sensation intact distally Intact pulses distally Dorsiflexion/Plantar flexion intact No cellulitis present Compartment soft  Right upper extremity in shoulder immobilizer. NVI. 2 + radial pulse. Dressing - dressing C/D/I and no drainage Motor Function - intact, moving foot and toes well on exam.   Past Medical History:  Diagnosis Date  . Angina pectoris syndrome (HCC)   . 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 Arthritis   . GERD (gastroesophageal reflux disease)   . Grief  at loss of child 09/03/2014  . Hypercholesteremia   . Hypertension   . Prostate enlargement     Assessment/Plan:   1 Day Post-Op Procedure(s) (LRB): TOTAL HIP ARTHROPLASTY ANTERIOR APPROACH (Right) URETERAL CATHETER OR STENT PLACEMENT (N/A) Active Problems:   Closed right hip fracture, initial encounter (HCC)  Estimated body mass index is 37.46 kg/m as calculated from the following:   Height as of this encounter: 5\' 9"  (1.753 m).   Weight as of this encounter: 115.1 kg (253 lb 11.2 oz). Advance diet Up with therapy  Needs BM Acute post op blood loss anemia- recheck labs in the am  DVT Prophylaxis - Lovenox, Foot Pumps and TED hose Weight-Bearing as tolerated to right leg. Non weight bearing Right upper extremity   T. Cranston Neighborhris Gaines, PA-C [facility] 12/06/2015, 8:07 AM

## 2015-12-06 NOTE — Evaluation (Signed)
Physical Therapy Evaluation Patient Details Name: Erik Palmer MRN: [id number] DOB: 01/13/1935 Today's Date: 12/06/2015   History of Present Illness  presented to ER status post fall from ladder with pain in R shoulder and hip, associated R non-displaced humeral fracture and displaced R femoral fracture; status post R THA, anterior approach, WBAT and application of R shoulder immobilizer (conservative management at this time), NWB  Clinical Impression  Patient very directive of care throughout session; limited response/cooperation with cuing and education from therapist, preferring to do things in his own manner.  Very vocal regarding pain in R hip > R shoulder; rated at 4-5/10 throughout session.  Currently requiring total assist +2 for all bed mobility; min/mod assis t+2 for sit/stand from elevated bed surface with HW in L UE.  Very poor tolerance for movement of R hip with therex and/or mobility.  Unable to tolerate weight acceptance and stabilize on R LE for attempts at L LE stepping/gait.  Will continue to progress mobility as patient allows. Would benefit from skilled PT to address above deficits and promote optimal return to PLOF; recommend transition to STR upon discharge from acute hospitalization.     Follow Up Recommendations SNF    Equipment Recommendations       Recommendations for Other Services       Precautions / Restrictions Precautions Precautions: Fall Restrictions Weight Bearing Restrictions: Yes RUE Weight Bearing: Non weight bearing RLE Weight Bearing: Weight bearing as tolerated Other Position/Activity Restrictions: R shoulder sling/immobilizer      Mobility  Bed Mobility Overal bed mobility: Needs Assistance;+2 for physical assistance Bed Mobility: Supine to Sit;Sit to Supine     Supine to sit: Total assist;+2 for physical assistance Sit to supine: Total assist;+2 for physical assistance   General bed mobility comments: total assist for truncal  elevation and LE management; patient very guarded and moves at extremely slow pace with movement transition.  Limited cooperation with cuing from therapist, preferring to "do this my own way"  Transfers Overall transfer level: Needs assistance Equipment used: Hemi-walker Transfers: Sit to/from Stand Sit to Stand: Min assist;Mod assist;+2 physical assistance;From elevated surface         General transfer comment: excessive weight shift to L LE with sit/stand from maximally elevated bed surface; very slow and guarded; limited weight acceptance through R LE  Ambulation/Gait             General Gait Details: unsafe/unable to complete due to inability to accept weight/stabilize R LE in loading phases  Stairs            Wheelchair Mobility    Modified Rankin (Stroke Patients Only)       Balance Overall balance assessment: Needs assistance Sitting-balance support: No upper extremity supported;Feet supported Sitting balance-Leahy Scale: Good     Standing balance support: Single extremity supported Standing balance-Leahy Scale: Fair                               Pertinent Vitals/Pain Pain Assessment: 0-10 Pain Score: 4  Pain Location: R hip > R shoulder Pain Descriptors / Indicators: Aching;Grimacing;Guarding Pain Intervention(s): Limited activity within patient's tolerance    Home Living Family/patient expects to be discharged to:: Private residence Living Arrangements: Spouse/significant other Available Help at Discharge: Family Type of Home: House Home Access: Stairs to enter Entrance Stairs-Rails: None Entrance Stairs-Number of Steps: 1 Home Layout: One level        Prior Function  Level of Independence: Independent         Comments: Indep with ADLs, household and community mobility     Hand Dominance   Dominant Hand: Right    Extremity/Trunk Assessment   Upper Extremity Assessment:  (R shoulder immobilized; able to flex/extend R  fingers and wrist.  L UE grossly WFL throughout)           Lower Extremity Assessment: Generalized weakness (R LE grossly 2+/5 throughout hip, very guarded/limited by pain; R knee and ankle, 4/5.  L LE grossly WFL.)         Communication   Communication: HOH  Cognition Arousal/Alertness: Awake/alert Behavior During Therapy: WFL for tasks assessed/performed (very directive of care) Overall Cognitive Status: Within Functional Limits for tasks assessed                      General Comments      Exercises Other Exercises Other Exercises: Supine R LE therex, 1x10, act assist ROM: ankle pumps, quad sets, SAQs, hip abduct/adduct and heel slides.  Very limited range tolerated, guarded due to pain. Other Exercises: Additional sit/stand from progressively lowered bed surface, min/mod assist +2 with hemi-walker. Unable to tolerate transition from bed surface at standard height at this time.   Assessment/Plan    PT Assessment Patient needs continued PT services  PT Problem List Decreased strength;Decreased range of motion;Decreased activity tolerance;Decreased balance;Decreased mobility;Decreased coordination;Decreased knowledge of use of DME;Decreased safety awareness;Decreased knowledge of precautions;Pain          PT Treatment Interventions DME instruction;Gait training;Stair training;Functional mobility training;Therapeutic activities;Therapeutic exercise;Balance training;Patient/family education    PT Goals (Current goals can be found in the Care Plan section)  Acute Rehab PT Goals Patient Stated Goal: "to this this my way" PT Goal Formulation: With patient Time For Goal Achievement: 12/20/15 Potential to Achieve Goals: Good    Frequency BID   Barriers to discharge Decreased caregiver support      Co-evaluation               End of Session Equipment Utilized During Treatment: Gait belt Activity Tolerance: Patient tolerated treatment well Patient left: in  bed;with call bell/phone within reach;with bed alarm set;with family/visitor present Nurse Communication: Mobility status         Time: 1049-1140 PT Time Calculation (min) (ACUTE ONLY): 51 min   Charges:   PT Evaluation $PT Eval Moderate Complexity: 1 Procedure PT Treatments $Therapeutic Exercise: 8-22 mins $Therapeutic Activity: 8-22 mins   PT G Codes:        Patryce Depriest H. Manson PasseyBrown, PT, DPT, NCS 12/06/15, 12:15 PM [phone]

## 2015-12-06 NOTE — Progress Notes (Signed)
Physical Therapy Treatment Patient Details Name: Erik Palmer MRN: [id number] DOB: 12-May-1934 Today's Date: 12/06/2015    History of Present Illness presented to ER status post fall from ladder with pain in R shoulder and hip, associated R non-displaced humeral fracture and displaced R femoral fracture; status post R THA, anterior approach, WBAT and application of R shoulder immobilizer (conservative management at this time), NWB    PT Comments    Patient continues to have greatest difficulty with bed mobility, demonstrating little active effort with movement transition. Very guarded with strong tendency to cease efforts and resist assist from therapist with slightest movement of R LE. Once upright, able to actually complete sit/stand (from elevated bed surface) with min/mod assist +2, though very slowly and on his own terms.  Unable to progress towards stepping or additional transfers at this time.   Follow Up Recommendations  SNF     Equipment Recommendations       Recommendations for Other Services       Precautions / Restrictions Precautions Precautions: Fall Restrictions Weight Bearing Restrictions: Yes RUE Weight Bearing: Non weight bearing RLE Weight Bearing: Weight bearing as tolerated Other Position/Activity Restrictions: R shoulder sling/immobilizer    Mobility  Bed Mobility Overal bed mobility: Needs Assistance;+2 for physical assistance Bed Mobility: Supine to Sit     Supine to sit: Total assist;+2 for physical assistance Sit to supine: Total assist;+2 for physical assistance   General bed mobility comments: very limited active participation with movement transition, yelling "stop" and resisting further movement with very minimal movement of R LE.  Constant encouragement, dependant assist +2 required to complete  Transfers Overall transfer level: Needs assistance Equipment used: Hemi-walker Transfers: Sit to/from Stand Sit to Stand: Min assist;Mod  assist;+2 physical assistance;From elevated surface         General transfer comment: maintains R LE in abducted position throughout transition.  Able to slide/scoot feet into more normalized BOS once standing, but unable to unweight/advance for formal stepping  Ambulation/Gait             General Gait Details: unsafe/unable to complete due to inability to accept weight/stabilize R LE in loading phases   Stairs            Wheelchair Mobility    Modified Rankin (Stroke Patients Only)       Balance Overall balance assessment: Needs assistance Sitting-balance support: No upper extremity supported;Feet supported Sitting balance-Leahy Scale: Good     Standing balance support: Single extremity supported Standing balance-Leahy Scale: Fair                      Cognition Arousal/Alertness: Awake/alert Behavior During Therapy: WFL for tasks assessed/performed;Agitated;Anxious Overall Cognitive Status: Within Functional Limits for tasks assessed                      Exercises Other Exercises Other Exercises: Supine R LE therex, 1x10, act assist ROM: ankle pumps, quad sets, SAQs, hip abduct/adduct and heel slides.  Very limited range tolerated, guarded due to pain. Other Exercises: Additional sit/stand from progressively lowered bed surface, min/mod assist +2 with hemi-walker. Unable to tolerate transition from bed surface at standard height at this time.    General Comments        Pertinent Vitals/Pain Pain Assessment: Faces Pain Score: 4  Faces Pain Scale: Hurts even more Pain Location: R hip > R shoulder Pain Descriptors / Indicators: Aching;Grimacing;Guarding Pain Intervention(s): Limited activity within patient's tolerance;Monitored during  session;Repositioned;Premedicated before session    Home Living Family/patient expects to be discharged to:: Private residence Living Arrangements: Spouse/significant other Available Help at Discharge:  Family Type of Home: House Home Access: Stairs to enter Entrance Stairs-Rails: None Home Layout: One level        Prior Function Level of Independence: Independent      Comments: Indep with ADLs, household and community mobility   PT Goals (current goals can now be found in the care plan section) Acute Rehab PT Goals Patient Stated Goal: "to this this my way" PT Goal Formulation: With patient Time For Goal Achievement: 12/20/15 Potential to Achieve Goals: Good Progress towards PT goals: Progressing toward goals    Frequency    BID      PT Plan Current plan remains appropriate    Co-evaluation             End of Session Equipment Utilized During Treatment: Gait belt Activity Tolerance: Patient tolerated treatment well Patient left:  (sitting edge of bed with OT at bedside)     Time: [id number] PT Time Calculation (min) (ACUTE ONLY): 30 min  Charges:  $Therapeutic Exercise: 8-22 mins $Therapeutic Activity: 23-37 mins                    G Codes:      Hester Forget H. Manson PasseyBrown, PT, DPT, NCS 12/06/15, 4:17 PM [phone]

## 2015-12-07 ENCOUNTER — Encounter
Admission: RE | Admit: 2015-12-07 | Discharge: 2015-12-07 | Disposition: A | Discharge status: Home / Self Care | Payer: Medicare Other | Source: Ambulatory Visit | Attending: Internal Medicine | Admitting: Internal Medicine

## 2015-12-07 ENCOUNTER — Inpatient Hospital Stay: Payer: Medicare Other

## 2015-12-07 DIAGNOSIS — Z9181 History of falling: Secondary | ICD-10-CM | POA: Diagnosis not present

## 2015-12-07 DIAGNOSIS — S42301A Unspecified fracture of shaft of humerus, right arm, initial encounter for closed fracture: Secondary | ICD-10-CM | POA: Diagnosis not present

## 2015-12-07 DIAGNOSIS — E7800 Pure hypercholesterolemia, unspecified: Secondary | ICD-10-CM | POA: Diagnosis not present

## 2015-12-07 DIAGNOSIS — M79605 Pain in left leg: Secondary | ICD-10-CM | POA: Diagnosis not present

## 2015-12-07 DIAGNOSIS — J309 Allergic rhinitis, unspecified: Secondary | ICD-10-CM | POA: Diagnosis not present

## 2015-12-07 DIAGNOSIS — S72001A Fracture of unspecified part of neck of right femur, initial encounter for closed fracture: Secondary | ICD-10-CM | POA: Diagnosis not present

## 2015-12-07 DIAGNOSIS — S72001D Fracture of unspecified part of neck of right femur, subsequent encounter for closed fracture with routine healing: Secondary | ICD-10-CM | POA: Diagnosis not present

## 2015-12-07 DIAGNOSIS — M810 Age-related osteoporosis without current pathological fracture: Secondary | ICD-10-CM | POA: Diagnosis not present

## 2015-12-07 DIAGNOSIS — R6889 Other general symptoms and signs: Secondary | ICD-10-CM | POA: Diagnosis not present

## 2015-12-07 DIAGNOSIS — S42214A Unspecified nondisplaced fracture of surgical neck of right humerus, initial encounter for closed fracture: Secondary | ICD-10-CM | POA: Diagnosis not present

## 2015-12-07 DIAGNOSIS — I10 Essential (primary) hypertension: Secondary | ICD-10-CM | POA: Diagnosis not present

## 2015-12-07 DIAGNOSIS — Z7401 Bed confinement status: Secondary | ICD-10-CM | POA: Diagnosis not present

## 2015-12-07 DIAGNOSIS — M6281 Muscle weakness (generalized): Secondary | ICD-10-CM | POA: Diagnosis not present

## 2015-12-07 DIAGNOSIS — R262 Difficulty in walking, not elsewhere classified: Secondary | ICD-10-CM | POA: Diagnosis not present

## 2015-12-07 DIAGNOSIS — S42301D Unspecified fracture of shaft of humerus, right arm, subsequent encounter for fracture with routine healing: Secondary | ICD-10-CM | POA: Diagnosis not present

## 2015-12-07 DIAGNOSIS — R112 Nausea with vomiting, unspecified: Secondary | ICD-10-CM | POA: Diagnosis not present

## 2015-12-07 DIAGNOSIS — I209 Angina pectoris, unspecified: Secondary | ICD-10-CM | POA: Diagnosis not present

## 2015-12-07 LAB — URINALYSIS COMPLETE WITH MICROSCOPIC (ARMC ONLY)
BILIRUBIN URINE: NEGATIVE
Bacteria, UA: NONE SEEN
Glucose, UA: NEGATIVE mg/dL
Leukocytes, UA: NEGATIVE
Nitrite: NEGATIVE
PH: 5 (ref 5.0–8.0)
Protein, ur: 30 mg/dL — AB
Specific Gravity, Urine: 1.023 (ref 1.005–1.030)

## 2015-12-07 LAB — SURGICAL PATHOLOGY

## 2015-12-07 MED ORDER — DOCUSATE SODIUM 100 MG PO CAPS: 100.0000 mg | ORAL_CAPSULE | Freq: Two times a day (BID) | ORAL | 0 refills | Status: DC

## 2015-12-07 MED ORDER — ENOXAPARIN SODIUM 40 MG/0.4ML ~~LOC~~ SOLN: 40.0000 mg | SUBCUTANEOUS | 0 refills | Status: DC

## 2015-12-07 MED ORDER — FLEET ENEMA 7-19 GM/118ML RE ENEM: 1.0000 | ENEMA | Freq: Once | RECTAL | Status: DC

## 2015-12-07 MED ORDER — NAPROXEN SODIUM 220 MG PO TABS: 220.0000 mg | ORAL_TABLET | Freq: Four times a day (QID) | ORAL | 0 refills | Status: DC | PRN

## 2015-12-07 MED ORDER — POLYETHYLENE GLYCOL 3350 17 G PO PACK: 17.0000 g | PACK | Freq: Every day | ORAL | 0 refills | Status: DC | PRN

## 2015-12-07 MED ORDER — HYDROCODONE-ACETAMINOPHEN 5-325 MG PO TABS: 1.0000 | ORAL_TABLET | Freq: Four times a day (QID) | ORAL | 0 refills | Status: DC | PRN

## 2015-12-07 NOTE — Progress Notes (Signed)
[facility]. Called many times to give report and no one answers phone. Gave EMS my [facility] to [facility] to [facility] me when they are available. IV removed with cath intact. LBM, this shift. Belongings sent with patient's wife.

## 2015-12-07 NOTE — Progress Notes (Signed)
Clinical Social Worker was informed that patient will be medically ready to discharge to [facility]. Patient in a agreement with plan. CSW called Marcelino DusterMichelle- Admissions Coordinator at [facility] to [facility] that [facility]'s bed is ready. Provided patient's room number 203 and number to call for report [phone] . [facility].   Call to Lurena JoinerRebecca to inform her patient would discharge to [facility]. RN will call report and patient will [facility] family [facility] at 1PM  Woodroe Modehristina Maigen Mozingo, MSW, LCSW, LCAS-A Clinical Social Worker [phone]

## 2015-12-07 NOTE — Progress Notes (Signed)
Left groin burning pain. No swelling noted. Pt has declined Lovenox for 2 days. Will check Bilateral LE venous doppler

## 2015-12-07 NOTE — Discharge Instructions (Signed)
°ANTERIOR APPROACH TOTAL HIP REPLACEMENT POSTOPERATIVE DIRECTIONS ° ° °Hip Rehabilitation, Guidelines Following Surgery  °The results of a hip operation are greatly improved after range of motion and muscle strengthening exercises. Follow all safety measures which are given to protect your hip. If any of these exercises cause increased pain or swelling in your joint, decrease the amount until you are comfortable again. Then slowly increase the exercises. Call your caregiver if you have problems or questions.  ° °HOME CARE INSTRUCTIONS  °Remove items at home which could result in a fall. This includes throw rugs or furniture in walking pathways.  °· ICE to the affected hip every three hours for 30 minutes at a time and then as needed for pain and swelling.  Continue to use ice on the hip for pain and swelling from surgery. You may notice swelling that will progress down to the foot and ankle.  This is normal after surgery.  Elevate the leg when you are not up walking on it.   °· Continue to use the breathing machine which will help keep your temperature down.  It is common for your temperature to cycle up and down following surgery, especially at night when you are not up moving around and exerting yourself.  The breathing machine keeps your lungs expanded and your temperature down. °· Do not place pillow under knee, focus on keeping the knee straight while resting ° °DIET °You may resume your previous home diet once your are discharged from the hospital. ° °DRESSING / WOUND CARE / SHOWERING °Keep dressing clean and dry. Change dressing only as needed. You may shower after your first follow up appointment with [facility]. ° °ACTIVITY °Walk with your walker as instructed. °Use walker as long as suggested by your caregivers. °Avoid periods of inactivity such as sitting longer than an hour when not asleep. This helps prevent blood clots.  °You may resume a sexual relationship in one month or when given the OK  by your doctor.  °You may return to work once you are cleared by your doctor.  °Do not drive a car for 6 weeks or until released by you surgeon.  °Do not drive while taking narcotics. ° °WEIGHT BEARING °Weight bearing as tolerated. Use walker/cane as needed for at least 4 weeks post op. ° °POSTOPERATIVE CONSTIPATION PROTOCOL °Constipation - defined medically as fewer than three stools per week and severe constipation as less than one stool per week. ° °One of the most common issues patients have following surgery is constipation.  Even if you have a regular bowel pattern at home, your normal regimen is likely to be disrupted due to multiple reasons following surgery.  Combination of anesthesia, postoperative narcotics, change in appetite and fluid intake all can affect your bowels.  In order to avoid complications following surgery, here are some recommendations in order to help you during your recovery period. ° °Colace (docusate) - Pick up an over-the-counter form of Colace or another stool softener and take twice a day as long as you are requiring postoperative pain medications.  Take with a full glass of water daily.  If you experience loose stools or diarrhea, hold the colace until you stool forms back up.  If your symptoms do not get better within 1 week or if they get worse, check with your doctor. ° °Dulcolax (bisacodyl) - Pick up over-the-counter and take as directed by the product packaging as needed to assist with the movement of your bowels.  Take with a   full glass of water.  Use this product as needed if not relieved by Colace only.  ° °MiraLax (polyethylene glycol) - Pick up over-the-counter to have on hand.  MiraLax is a solution that will increase the amount of water in your bowels to assist with bowel movements.  Take as directed and can mix with a glass of water, juice, soda, coffee, or tea.  Take if you go more than two days without a movement. °Do not use MiraLax more than once per day. Call your  doctor if you are still constipated or irregular after using this medication for 7 days in a row. ° °If you continue to have problems with postoperative constipation, please contact the office for further assistance and recommendations.  If you experience "the worst abdominal pain ever" or develop nausea or vomiting, please contact the office immediatly for further recommendations for treatment. ° °ITCHING ° If you experience itching with your medications, try taking only a single pain pill, or even half a pain pill at a time.  You can also use Benadryl over the counter for itching or also to help with sleep.  ° °TED HOSE STOCKINGS °Wear the elastic stockings on both legs for six weeks following surgery during the day but you may remove then at night for sleeping. ° °MEDICATIONS °See your medication summary on the “After Visit Summary” that the nursing staff will review with you prior to discharge.  You may have some home medications which will be placed on hold until you complete the course of blood thinner medication.  It is important for you to complete the blood thinner medication as prescribed by your surgeon.  Continue your approved medications as instructed at time of discharge. ° °PRECAUTIONS °If you experience chest pain or shortness of breath - call 911 immediately for transfer to the hospital emergency department.  °If you develop a fever greater that 101 F, purulent drainage from wound, increased redness or drainage from wound, foul odor from the wound/dressing, or calf pain - CONTACT YOUR SURGEON.   °                                                °FOLLOW-UP APPOINTMENTS °Make sure you keep all of your appointments after your operation with your surgeon and caregivers. You should call the office at the above phone number and make an appointment for approximately two weeks after the date of your surgery or on the date instructed by your surgeon outlined in the "After Visit Summary". ° °RANGE OF MOTION  AND STRENGTHENING EXERCISES  °These exercises are designed to help you keep full movement of your hip joint. Follow your caregiver's or physical therapist's instructions. Perform all exercises about fifteen times, three times per day or as directed. Exercise both hips, even if you have had only one joint replacement. These exercises can be done on a training (exercise) mat, on the floor, on a table or on a bed. Use whatever works the best and is most comfortable for you. Use music or television while you are exercising so that the exercises are a pleasant break in your day. This will make your life better with the exercises acting as a break in routine you can look forward to.  °Lying on your back, slowly slide your foot toward your buttocks, raising your knee up off the floor. Then   slowly slide your foot back down until your leg is straight again.  Lying on your back spread your legs as far apart as you can without causing discomfort.  Lying on your side, raise your upper leg and foot straight up from the floor as far as is comfortable. Slowly lower the leg and repeat.  Lying on your back, tighten up the muscle in the front of your thigh (quadriceps muscles). You can do this by keeping your leg straight and trying to raise your heel off the floor. This helps strengthen the largest muscle supporting your knee.  Lying on your back, tighten up the muscles of your buttocks both with the legs straight and with the knee bent at a comfortable angle while keeping your heel on the floor.   IF YOU ARE TRANSFERRED TO A SKILLED REHAB FACILITY If the patient is transferred to a skilled rehab facility following release from the hospital, a list of the current medications will be sent to the facility for the patient to continue.  When discharged from the skilled rehab facility, please have the facility set up the patient's Home Health Physical Therapy prior to being released. Also, the skilled facility will be responsible  for providing the patient with their medications at time of release from the facility to include their pain medication, the muscle relaxants, and their blood thinner medication. If the patient is still at the rehab facility at time of the two week follow up appointment, the skilled rehab facility will also need to assist the patient in arranging follow up appointment in our office and any transportation needs.  MAKE SURE YOU:  Understand these instructions.  Get help right away if you are not doing well or get worse.    Pick up stool softner and laxative for home use following surgery while on pain medications. Continue to use ice for pain and swelling after surgery. Do not use any lotions or creams on the incision until instructed by your surgeon.   Continue with Shoulder immobilizer at all times except for showering. Remove forearm to work on gentle elbow range of motion

## 2015-12-07 NOTE — Clinical Social Work Placement (Signed)
   CLINICAL SOCIAL WORK PLACEMENT  NOTE  Date:  12/07/2015  Patient Details  Name: Erik Palmer MRN: [id number] Date of Birth: Mar 18, 1934  Clinical Social Work is seeking post-discharge placement for this patient at the Skilled  Nursing Facility level of care (*CSW will initial, date and re-position this form in  chart as items are completed):  Yes   Patient/family provided with [facility] Clinical Social Work Department's list of facilities offering this level of care within the geographic area requested by the patient (or if unable, by the patient's family).  Yes   Patient/family informed of their freedom to choose among providers that offer the needed level of care, that participate in Medicare, Medicaid or managed care program needed by the patient, have an available bed and are willing to accept the patient.  Yes   Patient/family informed of [facility]'s ownership interest in [facility] and [facility], as well as of the fact that they are under no obligation to receive care at these facilities.  PASRR submitted to EDS on 12/05/15     PASRR number received on 12/05/15     Existing PASRR number confirmed on       FL2 transmitted to all facilities in geographic area requested by pt/family on 12/05/15     FL2 transmitted to all facilities within larger geographic area on       Patient informed that his/her managed care company has contracts with or will negotiate with certain facilities, including the following:        Yes   Patient/family informed of bed offers received.  Patient chooses bed at  [facility]     Physician recommends and patient chooses bed at      Patient to be transferred to  [facility] on 12/07/15.  Patient to be transferred to facility by  (Family)     Patient family notified on 12/07/15 of transfer.  Name of family member notified:   [facility]([place])     PHYSICIAN       Additional Comment:     _______________________________________________ Erik Lusherhristina E Lacresia Darwish, LCSW 12/07/2015, 11:13 AM

## 2015-12-07 NOTE — Progress Notes (Signed)
Occupational Therapy Treatment Patient Details Name: Erik Palmer MRN: [id number] DOB: August 25, 1934 Today's Date: 12/07/2015    History of present illness Pt. presents with a Right Hip and shoulder Fx. Right shoulder is immobilized, Pt. is S/P RTHA, anterior approach.   OT comments  Pt. Is a 81y.o. Male who was admitted with a right HipFx, and shoulder Fx. Pt. Presents with 8/10 pain. Pt. Is limited by pain. Pt. Continues to present with weakness, is NWBing, decreased activity tolerance, and decreased functional mobility. Pt. Continues to benefit from skilled OT services for ADL training, A/E training, and functional mobility, work simplification strategies, and compensatory strategies for RUE immobilization.   Follow Up Recommendations  SNF    Equipment Recommendations       Recommendations for Other Services      Precautions / Restrictions Precautions Precautions: Fall Restrictions Weight Bearing Restrictions: Yes RUE Weight Bearing: Non weight bearing RLE Weight Bearing: Weight bearing as tolerated Other Position/Activity Restrictions: Right shoulder Immobilizer                                             ADL Overall ADL's : Needs assistance/impaired Eating/Feeding: Set up   Grooming: Minimal assistance           Upper Body Dressing : Maximal assistance   Lower Body Dressing: Total assistance                        Vision                     Perception     Praxis      Cognition   Behavior During Therapy: WFL for tasks assessed/performed Overall Cognitive Status: Within Functional Limits for tasks assessed                       Extremity/Trunk Assessment               Exercises     Shoulder Instructions       General Comments      Pertinent Vitals/ Pain       Pain Assessment: 0-10 Pain Score: 8  Faces Pain Scale: Hurts even more Pain Location: Right Hip Pain Descriptors / Indicators:  Aching;Grimacing Pain Intervention(s): Limited activity within patient's tolerance;Monitored during session;RN gave pain meds during session;Repositioned  Home Living                                          Prior Functioning/Environment              Frequency  Min 1X/week        Progress Toward Goals  OT Goals(current goals can now be found in the care plan section)  Progress towards OT goals: Progressing toward goals  Acute Rehab OT Goals Patient Stated Goal: Pt. will be independen OT Goal Formulation: With family Potential to Achieve Goals: Good  Plan Discharge plan remains appropriate    Co-evaluation                 End of Session     Activity Tolerance Patient limited by pain   Patient Left in bed;with call bell/phone within reach;with bed alarm set   Nurse Communication  Time: 1025-1050 OT Time Calculation (min): 25 min  Charges: OT General Charges $OT Visit: 1 Procedure OT Treatments $Self Care/Home Management : 23-37 mins  Olegario MessierElaine Lille Karim, MS, OTR/L 12/07/2015, 10:59 AM

## 2015-12-07 NOTE — Progress Notes (Signed)
Physical Therapy Treatment Patient Details Name: Erik Palmer MRN: [id number] DOB: 05-01-1934 Today's Date: 12/07/2015    History of Present Illness Pt. presents with a Right Hip and shoulder Fx. Right shoulder is immobilized, Pt. is S/P RTHA, anterior approach.    PT Comments    Pt is making good progress towards goals this date. Pt appears more motivated to perform therapy and shows progression to assist more in mobility. Pt needs lots of cueing for sequencing and safe technique. Pt able to perform there-ex with decreased assist this date. Still unable to ambulate at this time as he is unable to fully load R LE to take steps. R UE immobilized and able to use HW on L UE.   Follow Up Recommendations  SNF     Equipment Recommendations       Recommendations for Other Services       Precautions / Restrictions Precautions Precautions: Fall Required Braces or Orthoses: Sling (R UE) Restrictions Weight Bearing Restrictions: Yes RUE Weight Bearing: Non weight bearing RLE Weight Bearing: Weight bearing as tolerated Other Position/Activity Restrictions: Right shoulder Immobilizer    Mobility  Bed Mobility Overal bed mobility: Needs Assistance;+2 for physical assistance Bed Mobility: Supine to Sit     Supine to sit: Mod assist Sit to supine: Max assist;+2 for physical assistance   General bed mobility comments: assist for sliding B LE off L side of bed. Pt then needs assist for trunk support and elevation to come to EOB. Pt needed frequent rest breaks during mobility secondary to pain. Decreased assist required this date. During sit->supine, pt needed +2 assist for controlled decent back to bed as well as scooting towards HOB. Trapeze bar used for mobility.  Transfers Overall transfer level: Needs assistance Equipment used: Hemi-walker Transfers: Sit to/from Stand Sit to Stand: Mod assist;From elevated surface         General transfer comment: Bed elevated for ease of  transfer. Pt able to stand with HW and demonstrate upright posture. Several attempts performed with pt able to gradually bear weight through R LE. Unable to fully load foot to perform pre gait activity.  Ambulation/Gait             General Gait Details: unsafe/unable to complete due to inability to accept weight/stabilize R LE in loading phases   Stairs            Wheelchair Mobility    Modified Rankin (Stroke Patients Only)       Balance                                    Cognition Arousal/Alertness: Awake/alert Behavior During Therapy: WFL for tasks assessed/performed Overall Cognitive Status: Within Functional Limits for tasks assessed                      Exercises Other Exercises Other Exercises: supine ther-ex on B LE including ankle pumps, quad sets, SLRs, SAQ, and hip abd/add. All ther-ex performed x 12 reps with cga for L LE and min assist on R LE. Cues given for correct technique.    General Comments        Pertinent Vitals/Pain Pain Assessment: Faces Pain Score: 8  Faces Pain Scale: Hurts whole lot Pain Location: R hip and L LE from urine Pain Descriptors / Indicators: Operative site guarding Pain Intervention(s): Limited activity within patient's tolerance;Repositioned    Home Living  Prior Function            PT Goals (current goals can now be found in the care plan section) Acute Rehab PT Goals Patient Stated Goal: to do this my way PT Goal Formulation: With patient Time For Goal Achievement: 12/20/15 Potential to Achieve Goals: Good Progress towards PT goals: Progressing toward goals    Frequency    BID      PT Plan Current plan remains appropriate    Co-evaluation             End of Session Equipment Utilized During Treatment: Gait belt Activity Tolerance: Patient tolerated treatment well Patient left: in bed;with call bell/phone within reach;with bed alarm set;with  family/visitor present     Time: [id number] PT Time Calculation (min) (ACUTE ONLY): 43 min  Charges:  $Therapeutic Exercise: 23-37 mins $Therapeutic Activity: 8-22 mins                    G Codes:      Erik Palmer 12/07/2015, 11:59 AM  Erik PalauStephanie Kip Palmer, PT, DPT [phone]

## 2015-12-07 NOTE — Discharge Summary (Signed)
[facility] - [place] [facility]   PATIENT NAME: Erik Palmer    MR#:  [id number]  DATE OF BIRTH:  27-Oct-1934  DATE OF ADMISSION:  12/04/2015 ADMITTING PHYSICIAN: [name], MD  DATE OF DISCHARGE: 12/07/15  PRIMARY CARE PHYSICIAN: Fidel Levy, MD    ADMISSION DIAGNOSIS:  Fall from ladder [Z30.XXXA] Closed fracture of right hip, initial encounter (HCC) [S72.001A] Shoulder fracture, right, closed, initial encounter [S42.91XA]  DISCHARGE DIAGNOSIS:  Right Hip fracture s/p surgery POD #2 Right humerus fracture s/p fall  SECONDARY DIAGNOSIS:   Past Medical History:  Diagnosis Date  . Angina pectoris syndrome (HCC)   . 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 Arthritis   . GERD (gastroesophageal reflux disease)   . Grief at loss of child 09/03/2014  . Hypercholesteremia   . Hypertension   . Prostate enlargement     HOSPITAL COURSE:   WalterHorneris a 81 y.o.malewith a known history of HTN, BPH, Depression here after falling from a 10 foot ladder. Patient landed on his right hip and shoulder. He had acute pain and on x-rays has a right hip fracture and right humerus fracture  * Right hip fracture POD#2 Lovenox as per orthopedics for 14 days. Ok from ortho standpoint for d/c . lovenox for 14 days Pain medications ordered. tolerating po diet  * Right humerus fracture Right shoulder immobilizer  * Hypertension Continue home medications  D/c to rehab today Spoke with CSW CONSULTS OBTAINED:  Treatment Team:  Kennedy Bucker, MD  DRUG ALLERGIES:   Allergies  Allergen Reactions  . Solu-Medrol [Methylprednisolone Acetate] Anxiety    DISCHARGE MEDICATIONS:   Current Discharge Medication List    START taking these medications   Details  docusate sodium (COLACE) 100 MG capsule Take 1 capsule (100 mg total) by mouth 2 (two) times daily. Qty: 10 capsule, Refills: 0    enoxaparin (LOVENOX) 40 MG/0.4ML  injection Inject 0.4 mLs (40 mg total) into the skin daily. Qty: 14 Syringe, Refills: 0    HYDROcodone-acetaminophen (NORCO/VICODIN) 5-325 MG tablet Take 1-2 tablets by mouth every 6 (six) hours as needed for moderate pain. Qty: 20 tablet, Refills: 0    polyethylene glycol (MIRALAX / GLYCOLAX) packet Take 17 g by mouth daily as needed for mild constipation. Qty: 14 each, Refills: 0      CONTINUE these medications which have CHANGED   Details  naproxen sodium (ANAPROX) 220 MG tablet Take 1 tablet (220 mg total) by mouth 4 (four) times daily as needed. Qty: 20 tablet, Refills: 0      CONTINUE these medications which have NOT CHANGED   Details  acetaminophen (TYLENOL) 650 MG CR tablet Take 650 mg by mouth daily. Reported on 05/08/2015    aspirin EC 81 MG tablet Take 81 mg by mouth every morning.    atorvastatin (LIPITOR) 40 MG tablet Take 40 mg by mouth daily.    fluticasone (FLONASE) 50 MCG/ACT nasal spray INHALE 2 SPRAYS INTO EACH NOSTRIL EVERY DAY Qty: 16 g, Refills: 11    hydrochlorothiazide (HYDRODIURIL) 25 MG tablet TAKE 1 TABLET BY MOUTH EVERY DAY Qty: 30 tablet, Refills: 6    metoprolol (LOPRESSOR) 50 MG tablet TAKE 1/2 TABLET BY MOUTH TWICE DAILY Qty: 30 tablet, Refills: 12    omeprazole (PRILOSEC) 20 MG capsule TAKE ONE CAPSULE BY MOUTH TWICE A DAY Qty: 60 capsule, Refills: 12    QUEtiapine (SEROQUEL) 50 MG tablet Take 1 tablet (50 mg total) by mouth at  bedtime. Qty: 90 tablet, Refills: 1    tamsulosin (FLOMAX) 0.4 MG CAPS capsule TAKE ONE CAPSULE BY MOUTH EVERY EVENING Qty: 30 capsule, Refills: 12    lisinopril (PRINIVIL,ZESTRIL) 20 MG tablet Take 1 tablet (20 mg total) by mouth daily. Qty: 30 tablet, Refills: 6   Associated Diagnoses: Essential hypertension        If you experience worsening of your admission symptoms, develop shortness of breath, life threatening emergency, suicidal or homicidal thoughts you must seek medical attention immediately by calling  911 or calling your MD immediately  if symptoms less severe.  You Must read complete instructions/literature along with all the possible adverse reactions/side effects for all the Medicines you take and that have been prescribed to you. Take any new Medicines after you have completely understood and accept all the possible adverse reactions/side effects.   Please note  You were cared for by a hospitalist during your hospital stay. If you have any questions about your discharge medications or the care you received while you were in the hospital after you are discharged, you can call the unit and asked to speak with the hospitalist on call if the hospitalist that took care of you is not available. Once you are discharged, your primary care physician will handle any further medical issues. Please note that NO REFILLS for any discharge medications will be authorized once you are discharged, as it is imperative that you return to your primary care physician (or establish a relationship with a primary care physician if you do not have one) for your aftercare needs so that they can reassess your need for medications and monitor your lab values. Today   SUBJECTIVE   C/o burning of skin in the perineal area. Says he has blisters  VITAL SIGNS:  Blood pressure (!) 127/56, pulse 93, temperature 98.3 F (36.8 C), temperature source Oral, resp. rate 18, height 5\' 9"  (1.753 m), weight 115.8 kg (255 lb 6.4 oz), SpO2 92 %.  I/O:   Intake/Output Summary (Last 24 hours) at 12/07/15 1057 Last data filed at 12/07/15 0900  Gross per 24 hour  Intake              840 ml  Output              621 ml  Net              219 ml    PHYSICAL EXAMINATION:  GENERAL:  81 y.o.-year-old patient lying in the bed with no acute distress.  EYES: Pupils equal, round, reactive to light and accommodation. No scleral icterus. Extraocular muscles intact.  HEENT: Head atraumatic, normocephalic. Oropharynx and nasopharynx clear.   NECK:  Supple, no jugular venous distention. No thyroid enlargement, no tenderness.  LUNGS: Normal breath sounds bilaterally, no wheezing, rales,rhonchi or crepitation. No use of accessory muscles of respiration.  CARDIOVASCULAR: S1, S2 normal. No murmurs, rubs, or gallops.  ABDOMEN: Soft, non-tender, non-distended. Bowel sounds present. No organomegaly or mass.  EXTREMITIES: No pedal edema, cyanosis, or clubbing.  NEUROLOGIC: Cranial nerves II through XII are intact. Muscle strength 5/5 in all extremities. Sensation intact. Gait not checked.  PSYCHIATRIC: The patient is alert and oriented x 3.  SKIN: No obvious rash, lesion, or ulcer.  GU: skin looks normal. No blister or erythema noted  DATA REVIEW:   CBC   Recent Labs Lab 12/06/15 0514  WBC 14.2*  HGB 11.2*  HCT 31.1*  PLT 167    Chemistries   Recent Labs  Lab 12/04/15 1530  12/06/15 0514  NA 135  < > 131*  K 3.2*  < > 3.3*  CL 101  < > 100*  CO2 27  < > 23  GLUCOSE 125*  < > 141*  BUN 14  < > 20  CREATININE 0.69  < > 0.85  CALCIUM 9.3  < > 7.8*  AST 37  --   --   ALT 28  --   --   ALKPHOS 71  --   --   BILITOT 0.9  --   --   < > = values in this interval not displayed.  Microbiology Results   Recent Results (from the past 240 hour(s))  MRSA PCR Screening     Status: None   Collection Time: 12/04/15  9:20 PM  Result Value Ref Range Status   MRSA by PCR NEGATIVE NEGATIVE Final    Comment:        The GeneXpert MRSA Assay (FDA approved for NASAL specimens only), is one component of a comprehensive MRSA colonization surveillance program. It is not intended to diagnose MRSA infection nor to guide or monitor treatment for MRSA infections.     RADIOLOGY:  Dg Hip Operative Unilat W Or W/o Pelvis Right  Result Date: 12/05/2015 CLINICAL DATA:  RIGHT femoral neck fracture, RIGHT hip surgery EXAM: OPERATIVE RIGHT HIP (WITH PELVIS IF PERFORMED) 1 VIEWS TECHNIQUE: Fluoroscopic spot image(s) were submitted for  interpretation post-operatively. COMPARISON:  Preoperative images 10 05/2015 FLUOROSCOPY TIME:  0 minutes 48 seconds Images obtained: 1 Reference air kerma:  18.9 mGy FINDINGS: Interval resection of RIGHT femoral head/neck. RIGHT hip prosthesis identified. Distal aspect of the femoral stem is not imaged. Bones demineralized. No fracture or dislocation seen. IMPRESSION: RIGHT hip replacement as above. Electronically Signed   By: Ulyses Southward M.D.   On: 12/05/2015 15:25   Dg Hip Unilat W Or W/o Pelvis 2-3 Views Right  Result Date: 12/05/2015 CLINICAL DATA:  Postop right hip replacement EXAM: DG HIP (WITH OR WITHOUT PELVIS) 2-3V RIGHT COMPARISON:  Right hip films of 12/04/2015 FINDINGS: Two views of the right hip show the right hip replacement components to be in good position. No complicating features are seen. IMPRESSION: Right total hip replacement components appear to be in good position. Electronically Signed   By: Dwyane Dee M.D.   On: 12/05/2015 16:00     Management plans discussed with the patient, family and they are in agreement.  CODE STATUS:     Code Status Orders        Start     Ordered   12/04/15 1748  Full code  Continuous     12/04/15 1748    Code Status History    Date Active Date Inactive Code Status Order ID Comments User Context   09/02/2014  9:36 PM 09/04/2014 12:58 PM Full Code [id number]  Wyatt Haste, MD ED      TOTAL TIME TAKING CARE OF THIS PATIENT: 40 minutes.    Sidney Silberman M.D on 12/07/2015 at 10:57 AM  Between 7am to 6pm - Pager - [phone] After 6pm go to [email or web address] - password [email or web address]  [facility]  Office  [phone]  CC: Primary care physician; Fidel Levy, MD

## 2015-12-07 NOTE — Progress Notes (Signed)
Patient c/o groin pain and blisters to the left groin area MD, RN, and CNA looked at this area and saw nothing.  Cleansed area with soap and water.  Patient has refused Lovenox the last two days, even after instruction about the reason for med.  Applied barrier cream.  Patient still c/o pain to that the left groin, talked with son and expressed a concern about possible blood clot. Notified Dr. Allena KatzPatel. New orders for an US of bilat lower legs. Informed patient and family.

## 2015-12-07 NOTE — Progress Notes (Signed)
   Subjective: 2 Days Post-Op Procedure(s) (LRB): TOTAL HIP ARTHROPLASTY ANTERIOR APPROACH (Right) URETERAL CATHETER OR STENT PLACEMENT (N/A) Patient reports pain as 8 on 0-10 scale.   Patient is well, and has had no acute complaints or problems Denies any CP, SOB, ABD pain. We will continue therapy today.  Plan is to go Rehab after hospital stay.  Objective: Vital signs in last 24 hours: Temp:  [98 F (36.7 C)-99.9 F (37.7 C)] 98.3 F (36.8 C) (10/06 0720) Pulse Rate:  [93-111] 93 (10/06 0720) Resp:  [18] 18 (10/06 0720) BP: (107-128)/(55-66) 127/56 (10/06 0720) SpO2:  [86 %-98 %] 92 % (10/06 0720) Weight:  [115.8 kg (255 lb 6.4 oz)] 115.8 kg (255 lb 6.4 oz) (10/06 0554)  Intake/Output from previous day: 10/05 0701 - 10/06 0700 In: 480 [P.O.:480] Out: 620 [Urine:620] Intake/Output this shift: Total I/O In: 360 [P.O.:360] Out: 1 [Urine:1]   Recent Labs  12/04/15 1530 12/05/15 0523 12/06/15 0514  HGB 13.8 13.7 11.2*    Recent Labs  12/05/15 0523 12/06/15 0514  WBC 14.2* 14.2*  RBC 4.22* 3.33*  HCT 39.9* 31.1*  PLT 241 167    Recent Labs  12/05/15 0523 12/06/15 0514  NA 135 131*  K 3.5 3.3*  CL 99* 100*  CO2 28 23  BUN 17 20  CREATININE 0.91 0.85  GLUCOSE 132* 141*  CALCIUM 8.8* 7.8*    Recent Labs  12/04/15 1530  INR 1.14    EXAM General - Patient is Alert, Appropriate and Oriented Extremity - Neurovascular intact Sensation intact distally Intact pulses distally Dorsiflexion/Plantar flexion intact No cellulitis present Compartment soft  Right upper extremity in shoulder immobilizer. NVI. 2 + radial pulse. Dressing - dressing C/D/I and no drainage Motor Function - intact, moving foot and toes well on exam.   Past Medical History:  Diagnosis Date  . Angina pectoris syndrome (HCC)   . 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 Arthritis   . GERD (gastroesophageal reflux disease)   . Grief at loss of child  09/03/2014  . Hypercholesteremia   . Hypertension   . Prostate enlargement     Assessment/Plan:   2 Days Post-Op Procedure(s) (LRB): TOTAL HIP ARTHROPLASTY ANTERIOR APPROACH (Right) URETERAL CATHETER OR STENT PLACEMENT (N/A) Active Problems:   Closed right hip fracture, initial encounter (HCC)  Estimated body mass index is 37.72 kg/m as calculated from the following:   Height as of this encounter: 5\' 9"  (1.753 m).   Weight as of this encounter: 115.8 kg (255 lb 6.4 oz). Advance diet Up with therapy  Discharge to SNF today pending BM. Continue with Shoulder immobilizer. He is able to come out of forearm cuff and work on gentle elbow ROM. He is NWB right upper extremity. WBAT right lower extremity. Follow up with [facility] in 2 weeks for staple removal and steri strip application Lovenox 40 mg SQ QD x 14 days  DVT Prophylaxis - Lovenox, Foot Pumps and TED hose Weight-Bearing as tolerated to right leg. Non weight bearing Right upper extremity   T. Cranston Neighborhris Gaines, PA-C [facility] 12/07/2015, 12:02 PM

## 2015-12-07 NOTE — Progress Notes (Signed)
Pt. Was offered a bath and refused to have one given to him.

## 2015-12-10 DIAGNOSIS — R112 Nausea with vomiting, unspecified: Secondary | ICD-10-CM | POA: Diagnosis not present

## 2015-12-10 DIAGNOSIS — I10 Essential (primary) hypertension: Secondary | ICD-10-CM | POA: Diagnosis not present

## 2015-12-10 DIAGNOSIS — M810 Age-related osteoporosis without current pathological fracture: Secondary | ICD-10-CM | POA: Diagnosis not present

## 2015-12-17 ENCOUNTER — Telehealth: Payer: Self-pay | Admitting: Family Medicine

## 2015-12-17 NOTE — Telephone Encounter (Signed)
Yes, just have them call for appt when they find out when he will be going home.-jh

## 2015-12-17 NOTE — Telephone Encounter (Signed)
[place] after hip surgery.  He was scheduled to come in on 10/19 for HFU appt.  Erik Palmer he has appt scheduled with Erik Palmer on 10/25 and Erik Palmer will have to transport him.  She wasn't aware of appt scheduled with Erik Palmer and she doesn't think she can get him transportation.  Can that be rescheduled after he get out of rehab?  Her call back numbers are [phone] or [phone].

## 2015-12-17 NOTE — Telephone Encounter (Signed)
Spouse notified.[place] 

## 2015-12-19 DIAGNOSIS — S42214A Unspecified nondisplaced fracture of surgical neck of right humerus, initial encounter for closed fracture: Secondary | ICD-10-CM | POA: Diagnosis not present

## 2015-12-20 ENCOUNTER — Inpatient Hospital Stay: Payer: Self-pay | Admitting: Family Medicine

## 2015-12-25 ENCOUNTER — Non-Acute Institutional Stay (SKILLED_NURSING_FACILITY): Payer: Medicare Other | Admitting: Gerontology

## 2015-12-25 DIAGNOSIS — S72001D Fracture of unspecified part of neck of right femur, subsequent encounter for closed fracture with routine healing: Secondary | ICD-10-CM

## 2015-12-26 DIAGNOSIS — S42301D Unspecified fracture of shaft of humerus, right arm, subsequent encounter for fracture with routine healing: Secondary | ICD-10-CM | POA: Diagnosis not present

## 2015-12-26 DIAGNOSIS — Z9181 History of falling: Secondary | ICD-10-CM | POA: Diagnosis not present

## 2015-12-26 DIAGNOSIS — I209 Angina pectoris, unspecified: Secondary | ICD-10-CM | POA: Diagnosis not present

## 2015-12-26 DIAGNOSIS — Z79891 Long term (current) use of opiate analgesic: Secondary | ICD-10-CM | POA: Diagnosis not present

## 2015-12-26 DIAGNOSIS — Z7982 Long term (current) use of aspirin: Secondary | ICD-10-CM | POA: Diagnosis not present

## 2015-12-26 DIAGNOSIS — Z96641 Presence of right artificial hip joint: Secondary | ICD-10-CM | POA: Diagnosis not present

## 2015-12-26 DIAGNOSIS — S72001D Fracture of unspecified part of neck of right femur, subsequent encounter for closed fracture with routine healing: Secondary | ICD-10-CM | POA: Diagnosis not present

## 2015-12-26 DIAGNOSIS — W19XXXD Unspecified fall, subsequent encounter: Secondary | ICD-10-CM | POA: Diagnosis not present

## 2015-12-26 DIAGNOSIS — I10 Essential (primary) hypertension: Secondary | ICD-10-CM | POA: Diagnosis not present

## 2015-12-27 ENCOUNTER — Encounter: Payer: Self-pay | Admitting: Emergency Medicine

## 2015-12-27 ENCOUNTER — Inpatient Hospital Stay
Admission: EM | Admit: 2015-12-27 | Discharge: 2015-12-31 | DRG: 644 | Disposition: A | Discharge status: Home / Self Care | Payer: Medicare Other | Attending: Internal Medicine | Admitting: Internal Medicine

## 2015-12-27 DIAGNOSIS — F064 Anxiety disorder due to known physiological condition: Secondary | ICD-10-CM | POA: Diagnosis present

## 2015-12-27 DIAGNOSIS — E222 Syndrome of inappropriate secretion of antidiuretic hormone: Secondary | ICD-10-CM | POA: Diagnosis not present

## 2015-12-27 DIAGNOSIS — Z7982 Long term (current) use of aspirin: Secondary | ICD-10-CM

## 2015-12-27 DIAGNOSIS — Z6836 Body mass index (BMI) 36.0-36.9, adult: Secondary | ICD-10-CM | POA: Diagnosis not present

## 2015-12-27 DIAGNOSIS — F5101 Primary insomnia: Secondary | ICD-10-CM | POA: Diagnosis not present

## 2015-12-27 DIAGNOSIS — R351 Nocturia: Secondary | ICD-10-CM | POA: Diagnosis not present

## 2015-12-27 DIAGNOSIS — K219 Gastro-esophageal reflux disease without esophagitis: Secondary | ICD-10-CM | POA: Diagnosis present

## 2015-12-27 DIAGNOSIS — E871 Hypo-osmolality and hyponatremia: Secondary | ICD-10-CM | POA: Diagnosis present

## 2015-12-27 DIAGNOSIS — N390 Urinary tract infection, site not specified: Secondary | ICD-10-CM | POA: Diagnosis present

## 2015-12-27 DIAGNOSIS — E440 Moderate protein-calorie malnutrition: Secondary | ICD-10-CM | POA: Diagnosis present

## 2015-12-27 DIAGNOSIS — E7800 Pure hypercholesterolemia, unspecified: Secondary | ICD-10-CM | POA: Diagnosis present

## 2015-12-27 DIAGNOSIS — I10 Essential (primary) hypertension: Secondary | ICD-10-CM | POA: Diagnosis present

## 2015-12-27 DIAGNOSIS — Z96641 Presence of right artificial hip joint: Secondary | ICD-10-CM | POA: Diagnosis present

## 2015-12-27 DIAGNOSIS — S72001D Fracture of unspecified part of neck of right femur, subsequent encounter for closed fracture with routine healing: Secondary | ICD-10-CM | POA: Insufficient documentation

## 2015-12-27 DIAGNOSIS — Z79899 Other long term (current) drug therapy: Secondary | ICD-10-CM

## 2015-12-27 DIAGNOSIS — Z7951 Long term (current) use of inhaled steroids: Secondary | ICD-10-CM | POA: Diagnosis not present

## 2015-12-27 DIAGNOSIS — Z888 Allergy status to other drugs, medicaments and biological substances status: Secondary | ICD-10-CM | POA: Diagnosis not present

## 2015-12-27 DIAGNOSIS — G4700 Insomnia, unspecified: Secondary | ICD-10-CM | POA: Diagnosis present

## 2015-12-27 DIAGNOSIS — E860 Dehydration: Secondary | ICD-10-CM | POA: Diagnosis present

## 2015-12-27 DIAGNOSIS — N400 Enlarged prostate without lower urinary tract symptoms: Secondary | ICD-10-CM | POA: Diagnosis present

## 2015-12-27 LAB — URINALYSIS COMPLETE WITH MICROSCOPIC (ARMC ONLY)
BILIRUBIN URINE: NEGATIVE
GLUCOSE, UA: NEGATIVE mg/dL
Hgb urine dipstick: NEGATIVE
Nitrite: NEGATIVE
PH: 6 (ref 5.0–8.0)
Protein, ur: NEGATIVE mg/dL
Specific Gravity, Urine: 1.013 (ref 1.005–1.030)

## 2015-12-27 LAB — COMPREHENSIVE METABOLIC PANEL
ALK PHOS: 123 U/L (ref 38–126)
ALT: 25 U/L (ref 17–63)
ANION GAP: 8 (ref 5–15)
AST: 27 U/L (ref 15–41)
Albumin: 4 g/dL (ref 3.5–5.0)
BILIRUBIN TOTAL: 0.9 mg/dL (ref 0.3–1.2)
BUN: 9 mg/dL (ref 6–20)
CALCIUM: 9.1 mg/dL (ref 8.9–10.3)
CO2: 23 mmol/L (ref 22–32)
Chloride: 87 mmol/L — ABNORMAL LOW (ref 101–111)
Creatinine, Ser: 0.59 mg/dL — ABNORMAL LOW (ref 0.61–1.24)
GFR calc Af Amer: >60 mL/min (ref >60)
GLUCOSE: 106 mg/dL — AB (ref 65–99)
POTASSIUM: 3.7 mmol/L (ref 3.5–5.1)
Sodium: 118 mmol/L — CL (ref 135–145)
TOTAL PROTEIN: 6.6 g/dL (ref 6.5–8.1)

## 2015-12-27 LAB — CBC
HEMATOCRIT: 31.8 % — AB (ref 40.0–52.0)
HEMOGLOBIN: 11.4 g/dL — AB (ref 13.0–18.0)
MCH: 32.5 pg (ref 26.0–34.0)
MCHC: 35.8 g/dL (ref 32.0–36.0)
MCV: 90.7 fL (ref 80.0–100.0)
Platelets: 479 10*3/uL — ABNORMAL HIGH (ref 150–440)
RBC: 3.5 MIL/uL — ABNORMAL LOW (ref 4.40–5.90)
RDW: 13.1 % (ref 11.5–14.5)
WBC: 7.6 10*3/uL (ref 3.8–10.6)

## 2015-12-27 LAB — CREATININE, URINE, RANDOM: Creatinine, Urine: 154 mg/dL

## 2015-12-27 LAB — SODIUM
SODIUM: 120 mmol/L — AB (ref 135–145)
Sodium: 120 mmol/L — ABNORMAL LOW (ref 135–145)

## 2015-12-27 LAB — SODIUM, URINE, RANDOM: SODIUM UR: 43 mmol/L

## 2015-12-27 LAB — TROPONIN I: Troponin I: <0.03 ng/mL (ref <0.03)

## 2015-12-27 MED ORDER — DOCUSATE SODIUM 100 MG PO CAPS
100.0000 mg | ORAL_CAPSULE | Freq: Two times a day (BID) | ORAL | Status: DC
  Administered 2015-12-27 – 2015-12-31 (×9): 100 mg via ORAL
  Filled 2015-12-27 (×9): qty 1

## 2015-12-27 MED ORDER — ENOXAPARIN SODIUM 40 MG/0.4ML ~~LOC~~ SOLN
40.0000 mg | SUBCUTANEOUS | Status: DC
  Administered 2015-12-27 – 2015-12-31 (×5): 40 mg via SUBCUTANEOUS
  Filled 2015-12-27 (×5): qty 0.4

## 2015-12-27 MED ORDER — CEFTRIAXONE SODIUM-DEXTROSE 1-3.74 GM-% IV SOLR
1.0000 g | INTRAVENOUS | Status: DC
  Administered 2015-12-27 – 2015-12-29 (×3): 1 g via INTRAVENOUS
  Filled 2015-12-27 (×3): qty 50

## 2015-12-27 MED ORDER — ACETAMINOPHEN 325 MG PO TABS
650.0000 mg | ORAL_TABLET | Freq: Four times a day (QID) | ORAL | Status: DC | PRN
Start: 2015-12-27 — End: 2015-12-31
  Administered 2015-12-29 – 2015-12-31 (×3): 650 mg via ORAL
  Filled 2015-12-27 (×2): qty 2

## 2015-12-27 MED ORDER — SODIUM CHLORIDE 0.9 % IV BOLUS (SEPSIS)
1000.0000 mL | Freq: Once | INTRAVENOUS | Status: AC
  Administered 2015-12-27: 1000 mL via INTRAVENOUS

## 2015-12-27 MED ORDER — NAPROXEN 250 MG PO TABS
250.0000 mg | ORAL_TABLET | Freq: Four times a day (QID) | ORAL | Status: DC | PRN
  Administered 2015-12-27: 17:00:00 250 mg via ORAL
  Filled 2015-12-27 (×3): qty 1

## 2015-12-27 MED ORDER — ALBUTEROL SULFATE (2.5 MG/3ML) 0.083% IN NEBU: 2.5000 mg | INHALATION_SOLUTION | RESPIRATORY_TRACT | Status: DC | PRN

## 2015-12-27 MED ORDER — POLYETHYLENE GLYCOL 3350 17 G PO PACK
17.0000 g | PACK | Freq: Every day | ORAL | Status: DC | PRN
  Administered 2015-12-31: 12:00:00 17 g via ORAL
  Filled 2015-12-27: qty 1

## 2015-12-27 MED ORDER — LORAZEPAM 0.5 MG PO TABS
0.5000 mg | ORAL_TABLET | Freq: Two times a day (BID) | ORAL | Status: DC | PRN
  Administered 2015-12-27 – 2015-12-29 (×3): 0.5 mg via ORAL
  Filled 2015-12-27 (×5): qty 1

## 2015-12-27 MED ORDER — BISACODYL 10 MG RE SUPP
10.0000 mg | Freq: Every day | RECTAL | Status: DC | PRN
  Administered 2015-12-27 – 2015-12-31 (×2): 10 mg via RECTAL
  Filled 2015-12-27 (×3): qty 1

## 2015-12-27 MED ORDER — ENSURE ENLIVE PO LIQD
237.0000 mL | Freq: Two times a day (BID) | ORAL | Status: DC
  Administered 2015-12-27 – 2015-12-31 (×9): 237 mL via ORAL

## 2015-12-27 MED ORDER — ONDANSETRON HCL 4 MG PO TABS
4.0000 mg | ORAL_TABLET | Freq: Four times a day (QID) | ORAL | Status: DC | PRN
  Administered 2015-12-27: 17:00:00 4 mg via ORAL
  Filled 2015-12-27: qty 1

## 2015-12-27 MED ORDER — QUETIAPINE FUMARATE 25 MG PO TABS
50.0000 mg | ORAL_TABLET | Freq: Every day | ORAL | Status: DC
  Administered 2015-12-27 – 2015-12-28 (×2): 50 mg via ORAL
  Filled 2015-12-27 (×2): qty 2

## 2015-12-27 MED ORDER — ACETAMINOPHEN 650 MG RE SUPP: 650.0000 mg | Freq: Four times a day (QID) | RECTAL | Status: DC | PRN

## 2015-12-27 MED ORDER — FLUTICASONE PROPIONATE 50 MCG/ACT NA SUSP
2.0000 | Freq: Every day | NASAL | Status: DC
  Administered 2015-12-27 – 2015-12-31 (×5): 2 via NASAL
  Filled 2015-12-27: qty 16

## 2015-12-27 MED ORDER — ASPIRIN EC 81 MG PO TBEC
81.0000 mg | DELAYED_RELEASE_TABLET | Freq: Every morning | ORAL | Status: DC
  Administered 2015-12-27 – 2015-12-31 (×5): 81 mg via ORAL
  Filled 2015-12-27 (×5): qty 1

## 2015-12-27 MED ORDER — KETOROLAC TROMETHAMINE 15 MG/ML IJ SOLN
15.0000 mg | Freq: Three times a day (TID) | INTRAMUSCULAR | Status: DC | PRN
  Administered 2015-12-27 – 2015-12-28 (×4): 15 mg via INTRAVENOUS
  Filled 2015-12-27 (×5): qty 1

## 2015-12-27 MED ORDER — POTASSIUM CHLORIDE IN NACL 20-0.9 MEQ/L-% IV SOLN
INTRAVENOUS | Status: DC
  Administered 2015-12-27 – 2015-12-29 (×4): via INTRAVENOUS
  Filled 2015-12-27 (×5): qty 1000

## 2015-12-27 MED ORDER — PANTOPRAZOLE SODIUM 40 MG PO TBEC
40.0000 mg | DELAYED_RELEASE_TABLET | Freq: Two times a day (BID) | ORAL | Status: DC
  Administered 2015-12-27 – 2015-12-31 (×9): 40 mg via ORAL
  Filled 2015-12-27 (×9): qty 1

## 2015-12-27 MED ORDER — DOCUSATE SODIUM 100 MG PO CAPS: 100.0000 mg | ORAL_CAPSULE | Freq: Two times a day (BID) | ORAL | Status: DC

## 2015-12-27 MED ORDER — ATORVASTATIN CALCIUM 20 MG PO TABS
40.0000 mg | ORAL_TABLET | Freq: Every day | ORAL | Status: DC
  Administered 2015-12-27 – 2015-12-30 (×4): 40 mg via ORAL
  Filled 2015-12-27 (×4): qty 2

## 2015-12-27 MED ORDER — TAMSULOSIN HCL 0.4 MG PO CAPS
0.4000 mg | ORAL_CAPSULE | Freq: Every evening | ORAL | Status: DC
  Administered 2015-12-27 – 2015-12-29 (×3): 0.4 mg via ORAL
  Filled 2015-12-27 (×3): qty 1

## 2015-12-27 MED ORDER — SODIUM CHLORIDE 0.9 % IV SOLN
Freq: Once | INTRAVENOUS | Status: AC
  Administered 2015-12-27: 1000 mL via INTRAVENOUS

## 2015-12-27 MED ORDER — ONDANSETRON HCL 4 MG/2ML IJ SOLN
4.0000 mg | Freq: Four times a day (QID) | INTRAMUSCULAR | Status: DC | PRN
  Administered 2015-12-29: 4 mg via INTRAVENOUS
  Filled 2015-12-27: qty 2

## 2015-12-27 MED ORDER — DEXTROSE 5 % IV SOLN: 1.0000 g | INTRAVENOUS | Status: DC

## 2015-12-27 MED ORDER — LORAZEPAM 2 MG/ML IJ SOLN
1.0000 mg | Freq: Once | INTRAMUSCULAR | Status: AC
  Administered 2015-12-27: 1 mg via INTRAVENOUS
  Filled 2015-12-27: qty 1

## 2015-12-27 MED ORDER — METOPROLOL TARTRATE 25 MG PO TABS
25.0000 mg | ORAL_TABLET | Freq: Two times a day (BID) | ORAL | Status: DC
  Administered 2015-12-27 – 2015-12-31 (×9): 25 mg via ORAL
  Filled 2015-12-27 (×9): qty 1

## 2015-12-27 MED ORDER — LISINOPRIL 20 MG PO TABS
20.0000 mg | ORAL_TABLET | Freq: Every day | ORAL | Status: DC
  Administered 2015-12-27 – 2015-12-31 (×4): 20 mg via ORAL
  Filled 2015-12-27 (×6): qty 1

## 2015-12-27 NOTE — ED Notes (Signed)
Spouse and daughter at bedside 

## 2015-12-27 NOTE — ED Provider Notes (Signed)
[facility] Emergency Department Provider Note   First MD Initiated Contact with Patient 12/27/15 [phone]     (approximate)  I have reviewed the triage vital signs and the nursing notes.   HISTORY  Chief Complaint Anxiety    HPI Erik Palmer is a 81 y.o. male with history of recent right hip and right shoulder fracture, anxiety presents via EMS to the emergency department with "feeling very anxious". Patient states stated that he was released from rehabilitation facility 2 days ago and has not had any trazodone since his discharge. Past Medical History:  Diagnosis Date  . Angina pectoris syndrome (HCC)   . 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 Arthritis   . GERD (gastroesophageal reflux disease)   . Grief at loss of child 09/03/2014  . Hypercholesteremia   . Hypertension   . Prostate enlargement     Patient Active Problem List   Diagnosis Date Noted  . Closed right hip fracture, initial encounter (HCC) 12/04/2015  . Hyperlipidemia 05/08/2015  . HBP (high blood pressure) 02/13/2015  . Morbid obesity (HCC) 12/22/2014  . Medicare annual wellness visit, subsequent 12/22/2014  . Acute anxiety 09/12/2014  . Grief at loss of child 09/03/2014  . 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 Hyponatremia 09/02/2014    Past Surgical History:  Procedure Laterality Date  . TOTAL HIP ARTHROPLASTY Right 12/05/2015   Procedure: TOTAL HIP ARTHROPLASTY ANTERIOR APPROACH;  Surgeon: Kennedy Bucker, MD;  Location: [facility] ORS;  Service: Orthopedics;  Laterality: Right;    Prior to Admission medications   Medication Sig Start Date End Date Taking? Authorizing Provider  acetaminophen (TYLENOL) 650 MG CR tablet Take 650 mg by mouth daily. Reported on 05/08/2015    Historical Provider, MD  aspirin EC 81 MG tablet Take 81 mg by mouth every morning.    Historical Provider, MD  atorvastatin (LIPITOR) 40 MG tablet Take 40 mg by mouth  daily.    Historical Provider, MD  docusate sodium (COLACE) 100 MG capsule Take 1 capsule (100 mg total) by mouth 2 (two) times daily. 12/07/15   Enedina Finner, MD  enoxaparin (LOVENOX) 40 MG/0.4ML injection Inject 0.4 mLs (40 mg total) into the skin daily. 12/08/15   Enedina Finner, MD  fluticasone (FLONASE) 50 MCG/ACT nasal spray INHALE 2 SPRAYS INTO EACH NOSTRIL EVERY DAY 09/19/15   Janeann Forehand., MD  hydrochlorothiazide (HYDRODIURIL) 25 MG tablet TAKE 1 TABLET BY MOUTH EVERY DAY 10/01/15   Janeann Forehand., MD  HYDROcodone-acetaminophen (NORCO/VICODIN) 5-325 MG tablet Take 1-2 tablets by mouth every 6 (six) hours as needed for moderate pain. 12/07/15   Enedina Finner, MD  lisinopril (PRINIVIL,ZESTRIL) 20 MG tablet Take 1 tablet (20 mg total) by mouth daily. Patient not taking: Reported on 12/04/2015 02/13/15   Janeann Forehand., MD  metoprolol (LOPRESSOR) 50 MG tablet TAKE 1/2 TABLET BY MOUTH TWICE DAILY 08/20/15   Janeann Forehand., MD  naproxen sodium (ANAPROX) 220 MG tablet Take 1 tablet (220 mg total) by mouth 4 (four) times daily as needed. 12/07/15   Enedina Finner, MD  omeprazole (PRILOSEC) 20 MG capsule TAKE ONE CAPSULE BY MOUTH TWICE A DAY 07/02/15   Janeann Forehand., MD  polyethylene glycol [facility] / Ethelene Hal) packet Take 17 g by mouth daily as needed for mild constipation. 12/07/15   Enedina Finner, MD  QUEtiapine (SEROQUEL) 50 MG tablet Take 1 tablet (50 mg total) by mouth  at bedtime. 09/13/15   Brandy Hale, MD  tamsulosin (FLOMAX) 0.4 MG CAPS capsule TAKE ONE CAPSULE BY MOUTH EVERY EVENING 07/31/15   Janeann Forehand., MD    Allergies Solu-medrol [methylprednisolone acetate]  Family History  Problem Relation Age of Onset  . Hypertension Mother   . Heart block Mother   . Stroke Mother   . Depression Mother   . Cancer Father   . Heart attack Sister   . COPD Sister   . Hypertension Brother   . Hypertension Brother   . Hypertension Brother   . Hypertension Brother   . Heart attack  Brother   . Diabetes Neg Hx     Social History Social History  Substance Use Topics  . Smoking status: Never Smoker  . Smokeless tobacco: Never Used  . Alcohol use No    Review of Systems Constitutional: No fever/chills Eyes: No visual changes. ENT: No sore throat. Cardiovascular: Denies chest pain. Respiratory: Denies shortness of breath. Gastrointestinal: No abdominal pain.  No nausea, no vomiting.  No diarrhea.  No constipation. Genitourinary: Negative for dysuria. Musculoskeletal: Negative for back pain. Skin: Negative for rash. Neurological: Negative for headaches, focal weakness or numbness. Psychiatric:Positive for anxiety  10-point ROS otherwise negative.  ____________________________________________   PHYSICAL EXAM:  VITAL SIGNS: ED Triage Vitals  Enc Vitals Group     BP 12/27/15 0430 136/60     Pulse Rate 12/27/15 0430 86     Resp 12/27/15 0530 18     Temp 12/27/15 0431 97.5 F (36.4 C)     Temp Source 12/27/15 0431 Oral     SpO2 12/27/15 0430 97 %     Weight 12/27/15 0432 225 lb (102.1 kg)     Height 12/27/15 0432 5\' 9"  (1.753 m)     Head Circumference --      Peak Flow --      Pain Score --      Pain Loc --      Pain Edu? --      Excl. in GC? --     Constitutional: Alert and oriented. Well appearing and in no acute distress. Eyes: Conjunctivae are normal. PERRL. EOMI. Head: Atraumatic. Nose: No congestion/rhinnorhea. Mouth/Throat: Mucous membranes are moist.  Oropharynx non-erythematous. Neck: No stridor.  No meningeal signs.  No cervical spine tenderness to palpation Cardiovascular: Normal rate, regular rhythm. Good peripheral circulation. Grossly normal heart sounds. Respiratory: Normal respiratory effort.  No retractions. Lungs CTAB. Gastrointestinal: Soft and nontender. No distention.  Musculoskeletal: No lower extremity tenderness nor edema. No gross deformities of extremities. Neurologic:  Normal speech and language. No gross focal  neurologic deficits are appreciated.  Skin:  Skin is warm, dry and intact. No rash noted. Psychiatric: Very anxious affect. Speech and behavior are normal.  ____________________________________________   LABS (all labs ordered are listed, but only abnormal results are displayed)  Labs Reviewed  CBC - Abnormal; Notable for the following:       Result Value   RBC 3.50 (*)    Hemoglobin 11.4 (*)    HCT 31.8 (*)    Platelets 479 (*)    All other components within normal limits  COMPREHENSIVE METABOLIC PANEL - Abnormal; Notable for the following:    Sodium 118 (*)    Chloride 87 (*)    Glucose, Bld 106 (*)    Creatinine, Ser 0.59 (*)    All other components within normal limits  URINALYSIS COMPLETEWITH MICROSCOPIC ([facility] ONLY)   ____________________________________________  EKG  ED ECG REPORT I, Frankclay N Deva Ron, the attending physician, personally viewed and interpreted this ECG.   Date: 12/27/2015  EKG Time: 6:08 AM  Rate: 80  Rhythm: Normal sinus rhythm  Axis: Normal  Intervals: Normal  ST&T Change: None  ____________________________________________   Procedures   Critical Care performed:CRITICAL CARE Performed by: Darci CurrentANDOLPH N Meriah Shands   Total critical care time: 30 minutes  Critical care time was exclusive of separately billable procedures and treating other patients.  Critical care was necessary to treat or prevent imminent or life-threatening deterioration.  Critical care was time spent personally by me on the following activities: development of treatment plan with patient and/or surrogate as well as nursing, discussions with consultants, evaluation of patient's response to treatment, examination of patient, obtaining history from patient or surrogate, ordering and performing treatments and interventions, ordering and review of laboratory studies, ordering and review of radiographic studies, pulse oximetry and re-evaluation of patient's  condition. ____________________________________________   INITIAL IMPRESSION / ASSESSMENT AND PLAN / ED COURSE  Pertinent labs & imaging results that were available during my care of the patient were reviewed by me and considered in my medical decision making (see chart for details).  Patient's wife and daughter presented to the emergency department and I obtained additional history from them. Per the patient's family he has had a very poor appetite since returning home from the rehabilitation facility. Family states that the patient has had episodes of confusion as well. I reviewed the patient's chart and noted a episode of hyponatremia July 2016. Lab work obtained which revealed a sodium of 118. Patient given IV normal saline bolus 2 L. Patient discussed with Dr. Tobi BastosPyreddy (hospitalist) for hospital admission for further evaluation and management   Clinical Course    ____________________________________________  FINAL CLINICAL IMPRESSION(S) / ED DIAGNOSES  Final diagnoses:  Hyponatremia     MEDICATIONS GIVEN DURING THIS VISIT:  Medications  LORazepam (ATIVAN) injection 1 mg (1 mg Intravenous Given 12/27/15 0449)     NEW OUTPATIENT MEDICATIONS STARTED DURING THIS VISIT:  New Prescriptions   No medications on file    Modified Medications   No medications on file    Discontinued Medications   No medications on file     Note:  This document was prepared using Dragon voice recognition software and may include unintentional dictation errors.    Darci Currentandolph N Willona Phariss, MD 12/27/15 [phone]

## 2015-12-27 NOTE — ED Notes (Signed)
Admitting in to see pt at this time.  

## 2015-12-27 NOTE — ED Triage Notes (Signed)
Pt presents to ED via EMS with c/o anxiety. States not able to sleep. Pt recently discharged [facility]hab after hip fracture. Pt states he was on trazodone but has not taking it since he was discharged.

## 2015-12-27 NOTE — ED Notes (Signed)
Pt informed that urine is needed for UA, states not able now but will try again.  

## 2015-12-27 NOTE — H&P (Signed)
[employer] [facility] [place] [facility]   PATIENT NAME: Erik Palmer    MR#:  [id number]  DATE OF BIRTH:  30-Oct-1934  DATE OF ADMISSION:  12/27/2015  PRIMARY CARE PHYSICIAN: Fidel LevyJames Hawkins Jr, MD   REQUESTING/REFERRING PHYSICIAN: Dr. Dolores FrameSung  CHIEF COMPLAINT:   Chief Complaint  Patient presents with  . Anxiety    HISTORY OF PRESENT ILLNESS:  Erik Palmer  is a 81 y.o. male with a known history of Hypertension, recent hip fracture, right humerus fracture, anxiety presents to the emergency room after noticing more anxiety and insomnia. He has had 28 pound weight loss last few weeks. He has had decreased appetite and poor oral intake. Dark urine. Some dysuria. No vomiting. Was started on tramadol and trazodone in the rehabilitation. Today in the emergency room patient was found to have sodium of 118 and is being admitted to the hospitalist service. His last known sodium was 131. Urinalysis showed too numerous to count white blood cells with rare bacteria.  PAST MEDICAL HISTORY:   Past Medical History:  Diagnosis Date  . Angina pectoris syndrome (HCC)   . 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 Arthritis   . GERD (gastroesophageal reflux disease)   . Grief at loss of child 09/03/2014  . Hypercholesteremia   . Hypertension   . Prostate enlargement     PAST SURGICAL HISTORY:   Past Surgical History:  Procedure Laterality Date  . TOTAL HIP ARTHROPLASTY Right 12/05/2015   Procedure: TOTAL HIP ARTHROPLASTY ANTERIOR APPROACH;  Surgeon: Kennedy BuckerMichael Menz, MD;  Location: [facility] ORS;  Service: Orthopedics;  Laterality: Right;    SOCIAL HISTORY:   Social History  Substance Use Topics  . Smoking status: Never Smoker  . Smokeless tobacco: Never Used  . Alcohol use No    FAMILY HISTORY:   Family History  Problem Relation Age of Onset  . Hypertension Mother   . Heart block Mother   . Stroke Mother   . Depression Mother   . Cancer Father   . Heart  attack Sister   . COPD Sister   . Hypertension Brother   . Hypertension Brother   . Hypertension Brother   . Hypertension Brother   . Heart attack Brother   . Diabetes Neg Hx     DRUG ALLERGIES:   Allergies  Allergen Reactions  . Solu-Medrol [Methylprednisolone Acetate] Anxiety    REVIEW OF SYSTEMS:   Review of Systems  Constitutional: Positive for malaise/fatigue and weight loss. Negative for chills and fever.  HENT: Negative for hearing loss and nosebleeds.   Eyes: Negative for blurred vision, double vision and pain.  Respiratory: Negative for cough, hemoptysis, sputum production, shortness of breath and wheezing.   Cardiovascular: Negative for chest pain, palpitations, orthopnea and leg swelling.  Gastrointestinal: Positive for nausea. Negative for abdominal pain, constipation, diarrhea and vomiting.  Genitourinary: Negative for dysuria and hematuria.  Musculoskeletal: Positive for back pain and joint pain. Negative for falls and myalgias.  Skin: Negative for rash.  Neurological: Positive for dizziness and weakness. Negative for tremors, sensory change, speech change, focal weakness, seizures and headaches.  Endo/Heme/Allergies: Does not bruise/bleed easily.  Psychiatric/Behavioral: Negative for depression and memory loss. The patient is not nervous/anxious.     MEDICATIONS AT HOME:   Prior to Admission medications   Medication Sig Start Date End Date Taking? Authorizing Provider  acetaminophen (TYLENOL) 650 MG CR tablet Take 650 mg by mouth daily. Reported on 05/08/2015   Yes Historical  Provider, MD  aspirin EC 81 MG tablet Take 81 mg by mouth every morning.   Yes Historical Provider, MD  atorvastatin (LIPITOR) 40 MG tablet Take 40 mg by mouth daily.   Yes Historical Provider, MD  cholecalciferol (VITAMIN D) 400 units TABS tablet Take 400 Units by mouth daily.   Yes Historical Provider, MD  docusate sodium (COLACE) 100 MG capsule Take 1 capsule (100 mg total) by mouth 2  (two) times daily. 12/07/15  Yes Enedina Finner, MD  fluticasone (FLONASE) 50 MCG/ACT nasal spray INHALE 2 SPRAYS INTO EACH NOSTRIL EVERY DAY 09/19/15  Yes Janeann Forehand., MD  hydrochlorothiazide (HYDRODIURIL) 25 MG tablet TAKE 1 TABLET BY MOUTH EVERY DAY 10/01/15  Yes Janeann Forehand., MD  lisinopril (PRINIVIL,ZESTRIL) 20 MG tablet Take 1 tablet (20 mg total) by mouth daily. 02/13/15  Yes Janeann Forehand., MD  metoprolol (LOPRESSOR) 50 MG tablet TAKE 1/2 TABLET BY MOUTH TWICE DAILY 08/20/15  Yes Janeann Forehand., MD  naproxen sodium (ANAPROX) 220 MG tablet Take 1 tablet (220 mg total) by mouth 4 (four) times daily as needed. 12/07/15  Yes Enedina Finner, MD  omeprazole (PRILOSEC) 20 MG capsule TAKE ONE CAPSULE BY MOUTH TWICE A DAY 07/02/15  Yes Janeann Forehand., MD  polyethylene glycol [facility] / Ethelene Hal) packet Take 17 g by mouth daily as needed for mild constipation. 12/07/15  Yes Enedina Finner, MD  QUEtiapine (SEROQUEL) 50 MG tablet Take 1 tablet (50 mg total) by mouth at bedtime. 09/13/15  Yes Brandy Hale, MD  senna (SENOKOT) 8.6 MG TABS tablet Take 2 tablets by mouth at bedtime as needed for mild constipation.   Yes Historical Provider, MD  tamsulosin (FLOMAX) 0.4 MG CAPS capsule TAKE ONE CAPSULE BY MOUTH EVERY EVENING 07/31/15  Yes Janeann Forehand., MD  traMADol (ULTRAM) 50 MG tablet Take 50-100 mg by mouth every 4 (four) hours as needed.   Yes Historical Provider, MD  traZODone (DESYREL) 50 MG tablet Take 50 mg by mouth at bedtime.   Yes Historical Provider, MD     VITAL SIGNS:  Blood pressure 132/72, pulse 77, temperature 97.5 F (36.4 C), temperature source Oral, resp. rate 18, height 5\' 9"  (1.753 m), weight 102.1 kg (225 lb), SpO2 98 %.  PHYSICAL EXAMINATION:  Physical Exam  GENERAL:  81 y.o.-year-old patient lying in the bed with no acute distress.  EYES: Pupils equal, round, reactive to light and accommodation. No scleral icterus. Extraocular muscles intact.  HEENT: Head  atraumatic, normocephalic. Oropharynx and nasopharynx clear. No oropharyngeal erythema, moist oral mucosa  NECK:  Supple, no jugular venous distention. No thyroid enlargement, no tenderness.  LUNGS: Normal breath sounds bilaterally, no wheezing, rales, rhonchi. No use of accessory muscles of respiration.  CARDIOVASCULAR: S1, S2 normal. No murmurs, rubs, or gallops.  ABDOMEN: Soft, nontender, nondistended. Bowel sounds present. No organomegaly or mass.  EXTREMITIES: No pedal edema, cyanosis, or clubbing. + 2 pedal & radial pulses b/l.   NEUROLOGIC: Cranial nerves II through XII are intact. No focal Motor or sensory deficits appreciated b/l PSYCHIATRIC: The patient is alert and oriented x 3. Good affect.  SKIN: No obvious rash, lesion, or ulcer.   Right shoulder sling  LABORATORY PANEL:   CBC  Recent Labs Lab 12/27/15 0436  WBC 7.6  HGB 11.4*  HCT 31.8*  PLT 479*   ------------------------------------------------------------------------------------------------------------------  Chemistries   Recent Labs Lab 12/27/15 0436  NA 118*  K 3.7  CL 87*  CO2 23  GLUCOSE 106*  BUN 9  CREATININE 0.59*  CALCIUM 9.1  AST 27  ALT 25  ALKPHOS 123  BILITOT 0.9   ------------------------------------------------------------------------------------------------------------------  Cardiac Enzymes  Recent Labs Lab 12/27/15 0436  TROPONINI <0.03   ------------------------------------------------------------------------------------------------------------------  RADIOLOGY:  No results found.   IMPRESSION AND PLAN:   * Severe hyponatremia Likely dehydration and decreased solute load. On HCTZ Start IVF Add ensure Check Urine Cr and Na, UA Monitor I/Os and daily weight Serial Na  * UTI Start ceftriaxone and send for urine cultures.  * HTN Continue metoprolol and Lisinopril Hold HCTZ  * Recent hip fracture Resolved  * DVT prophylaxis Lovenox  All the records are  reviewed and case discussed with ED provider. Management plans discussed with the patient, family and they are in agreement.  CODE STATUS: FULL CODE  TOTAL TIME TAKING CARE OF THIS PATIENT: 40 minutes.   [name] R M.D on 12/27/2015 at 9:00 AM  Between 7am to 6pm - Pager - [phone]  After 6pm go to [email or web address] - password [email or web address] [facility]  [facility] [place] [facility]  Office  [phone]  CC: Primary care physician; Fidel Levy, MD  Note: This dictation was prepared with Dragon dictation along with smaller phrase technology. Any transcriptional errors that result from this process are unintentional.

## 2015-12-27 NOTE — Evaluation (Signed)
Physical Therapy Evaluation Patient Details Name: Erik Palmer MRN: [id number] DOB: June 08, 1934 Today's Date: 12/27/2015   History of Present Illness  Pt is a 81 y/o M who presented to the emergency room due to anxiety and insomnia.  Discovered severe hyponatremia with workup.  Pt's PMH includes recent hip fx s/p R THA anterior approach, R humerus fx in sling, angina.      Clinical Impression  Pt admitted with above diagnosis. Pt currently with functional limitations due to the deficits listed below (see PT Problem List). Mr. Erik Palmer is easily agitated but is agreeable to therapy.  He ambulated 300 ft in hallway with hemi-walker but refused technique suggested for this PT for proper use of hemi-walker.  He was [facility] and is planning to return home at [facility] with 24/7 assist from his wife. Pt will benefit from skilled PT to increase their independence and safety with mobility to allow discharge to the venue listed below.      Follow Up Recommendations Home health PT;Supervision for mobility/OOB    Equipment Recommendations  Other (comment) (TBD by HHPT; likely will initiate cane training soon)    Recommendations for Other Services       Precautions / Restrictions Precautions Precautions: Fall Required Braces or Orthoses: Sling (RUE) Restrictions Weight Bearing Restrictions: Yes RUE Weight Bearing: Non weight bearing RLE Weight Bearing: Weight bearing as tolerated Other Position/Activity Restrictions: Pt reports he is still NWB RUE but has been told he can dangle      Mobility  Bed Mobility               General bed mobility comments: Pt sitting EOB upon PT arrival  Transfers Overall transfer level: Needs assistance Equipment used: Hemi-walker Transfers: Sit to/from Stand Sit to Stand: Supervision         General transfer comment: Supervision for safety  Ambulation/Gait Ambulation/Gait assistance: Supervision Ambulation Distance  (Feet): 300 Feet Assistive device: Hemi-walker Gait Pattern/deviations: Step-to pattern;Decreased stride length;Antalgic;Trunk flexed Gait velocity: decreased Gait velocity interpretation: Below normal speed for age/gender General Gait Details: Pt refuses to heed instructions given for proper use of hemi-walker and instead advances hemi-walker straight in front of his L foot with a step to gait pattern and flexed posture.    Stairs            Wheelchair Mobility    Modified Rankin (Stroke Patients Only)       Balance Overall balance assessment: Needs assistance Sitting-balance support: No upper extremity supported;Feet supported Sitting balance-Leahy Scale: Good     Standing balance support: No upper extremity supported;During functional activity Standing balance-Leahy Scale: Fair Standing balance comment: Pt able to stand statically without UE support but requires UE support for dynamic activities                             Pertinent Vitals/Pain Pain Assessment: 0-10 Pain Score: 4  Pain Location: abdomen Pain Descriptors / Indicators: Aching Pain Intervention(s): Limited activity within patient's tolerance;Monitored during session    Home Living Family/patient expects to be discharged to:: Private residence Living Arrangements: Spouse/significant other Available Help at Discharge: Family;Available 24 hours/day Type of Home: House Home Access: Ramped entrance (built while [facility] was at [facility])     Home Layout: One level Home Equipment: Bedside commode;Shower seat (hemi walker)      Prior Function Level of Independence: Needs assistance   Gait / Transfers Assistance Needed: Has been walking  down the hall at [facility] without issues using hemi walker  ADL's / Homemaking Assistance Needed: Pt is taking a shower while seated without assist.  Wife has been assisting with donning shirt.          Hand Dominance   Dominant Hand: Right     Extremity/Trunk Assessment   Upper Extremity Assessment: RUE deficits/detail RUE Deficits / Details: remains in sling and pt reports he still has orders from his MD for NWB         Lower Extremity Assessment: RLE deficits/detail RLE Deficits / Details: strength grossly 4-/5       Communication   Communication: HOH  Cognition Arousal/Alertness: Awake/alert Behavior During Therapy: WFL for tasks assessed/performed;Agitated Overall Cognitive Status: Within Functional Limits for tasks assessed                      General Comments      Exercises     Assessment/Plan    PT Assessment Patient needs continued PT services  PT Problem List Decreased strength;Decreased range of motion;Decreased activity tolerance;Decreased balance;Decreased mobility;Decreased knowledge of use of DME;Decreased safety awareness;Pain          PT Treatment Interventions DME instruction;Gait training;Functional mobility training;Therapeutic activities;Therapeutic exercise;Balance training;Neuromuscular re-education;Patient/family education    PT Goals (Current goals can be found in the Care Plan section)  Acute Rehab PT Goals Patient Stated Goal: to go home PT Goal Formulation: With patient Time For Goal Achievement: 01/03/16 Potential to Achieve Goals: Good    Frequency Min 2X/week   Barriers to discharge        Co-evaluation               End of Session Equipment Utilized During Treatment: Gait belt Activity Tolerance: Patient tolerated treatment well Patient left: in chair;with call bell/phone within reach;with family/visitor present Nurse Communication: Mobility status;Weight bearing status         Time: 1610-9604 PT Time Calculation (min) (ACUTE ONLY): 26 min   Charges:   PT Evaluation $PT Eval Low Complexity: 1 Procedure PT Treatments $Gait Training: 8-22 mins   PT G CodesEncarnacion Chu PT, DPT 12/27/2015, 2:59 PM

## 2015-12-27 NOTE — Progress Notes (Signed)
Location:      Place of Service:  SNF (31)  Provider: Lorenso QuarryShannon Midori Dado, NP-C  PCP: Fidel LevyJames Hawkins Jr, MD Patient Care Team: Janeann ForehandJames H Hawkins Jr., MD as PCP - [facility])  Extended Emergency Contact Information Primary Emergency Contact: [facility]Rebecca Address: [address] [place]          [place] [phone] [place] Home Phone: [phone] Relation: Spouse Secondary Emergency Contact: Roberson,Lisa Address: [address]          [place], [place] [id number] [place] Home Phone: [phone] Relation: Daughter  Code Status: full Goals of care:  Advanced Directive information Advanced Directives 12/27/2015  Does patient have an advance directive? No  Does patient want to make changes to advanced directive? -  Copy of advanced directive(s) in chart? -  Would patient like information on creating an advanced directive? No - patient declined information     Allergies  Allergen Reactions  . Solu-Medrol [Methylprednisolone Acetate] Anxiety    Chief Complaint  Patient presents with  . Discharge Note    HPI:  81 y.o. male seen today for discharge evaluation. Pt was admitted to the facility for rehab following admission to [facility] for right femur fracture and right humerus fracture s/p fall from a ladder with surgical intervention. He underwent physical and occupational therapies while at rehab. His stay was uneventful. He reports his pain is well controlled. Pain is 0/10 at best, 5/10 at worst. Bowels moving. Voiding appropriately. Denies n/v/d/f/c/cp/sob/ha/abd pain/dizziness. Pt feels he is ready for discharge.       Past Medical History:  Diagnosis Date  . Angina pectoris syndrome (HCC)   . 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 Arthritis   . GERD (gastroesophageal reflux disease)   . Grief at loss of child 09/03/2014  . Hypercholesteremia   . Hypertension   . Prostate enlargement     Past Surgical  History:  Procedure Laterality Date  . TOTAL HIP ARTHROPLASTY Right 12/05/2015   Procedure: TOTAL HIP ARTHROPLASTY ANTERIOR APPROACH;  Surgeon: Kennedy BuckerMichael Menz, MD;  Location: [facility] ORS;  Service: Orthopedics;  Laterality: Right;      reports that he has never smoked. He has never used smokeless tobacco. He reports that he does not drink alcohol or use drugs. Social History   Social History  . Marital status: Married    Spouse name: N/A  . Number of children: N/A  . Years of education: N/A   Occupational History  . Not on file.   Social History Main Topics  . Smoking status: Never Smoker  . Smokeless tobacco: Never Used  . Alcohol use No  . Drug use: No  . Sexual activity: Not Currently   Other Topics Concern  . Not on file   Social History Narrative  . No narrative on file   Functional Status Survey:    Allergies  Allergen Reactions  . Solu-Medrol [Methylprednisolone Acetate] Anxiety    Pertinent  Health Maintenance Due  Topic Date Due  . INFLUENZA VACCINE  01/08/2016 (Originally 10/02/2015)  . PNA vac Low Risk Adult (2 of 2 - PPSV23) 01/22/2016 (Originally 06/20/2015)    Medications:   Medication List    Notice   This visit is during an admission. Changes to the med list made in this visit will be reflected in the After Visit Summary of the admission.     Review of Systems  Constitutional: Negative for activity change, appetite change, chills, diaphoresis and  fever.  HENT: Negative.   Eyes: Negative.   Respiratory: Negative for apnea, cough, choking, chest tightness, shortness of breath and wheezing.   Cardiovascular: Negative for chest pain, palpitations and leg swelling.  Gastrointestinal: Negative for abdominal distention, abdominal pain, constipation, diarrhea and nausea.  Endocrine: Negative.   Genitourinary: Negative for difficulty urinating, dysuria, frequency and urgency.  Musculoskeletal: Positive for arthralgias (typical arthritis) and joint  swelling. Negative for back pain, gait problem and myalgias.  Skin: Positive for wound. Negative for color change, pallor and rash.  Neurological: Negative for dizziness, tremors, syncope, speech difficulty, weakness, numbness and headaches.  Psychiatric/Behavioral: Negative for agitation and behavioral problems.  All other systems reviewed and are negative.   Vitals:   12/25/15 0600  BP: 121/62  Pulse: 71  Resp: 20  Temp: 98.3 F (36.8 C)  SpO2: 99%  Weight: 225 lb (102.1 kg)   Body mass index is 33.23 kg/m. Physical Exam  Constitutional: He is oriented to person, place, and time. Vital signs are normal. He appears well-developed and well-nourished. He is active and cooperative. He does not appear ill. No distress.  HENT:  Head: Normocephalic and atraumatic.  Mouth/Throat: Uvula is midline, oropharynx is clear and moist and mucous membranes are normal. Mucous membranes are not pale, not dry and not cyanotic.  Eyes: Conjunctivae, EOM and lids are normal. Pupils are equal, round, and reactive to light.  Neck: Trachea normal, normal range of motion and full passive range of motion without pain. Neck supple. No JVD present. No tracheal deviation, no edema and no erythema present. No thyromegaly present.  Cardiovascular: Normal rate, regular rhythm, normal heart sounds and intact distal pulses.  Exam reveals no gallop, no distant heart sounds and no friction rub.   No murmur heard. Pulses:      Radial pulses are 1+ on the right side, and 1+ on the left side.       Dorsalis pedis pulses are 1+ on the right side, and 1+ on the left side.  Pulmonary/Chest: Effort normal and breath sounds normal. No accessory muscle usage. No respiratory distress. He has no decreased breath sounds. He has no wheezes. He has no rhonchi. He has no rales. He exhibits no tenderness.  Abdominal: Normal appearance and bowel sounds are normal. He exhibits no distension and no ascites. There is no tenderness.    Musculoskeletal: He exhibits no edema.       Right shoulder: He exhibits decreased range of motion, tenderness and swelling.       Right hip: He exhibits decreased range of motion and decreased strength.  Expected osteoarthritis, stiffness; calves soft, supple, negative homan's sign  Neurological: He is alert and oriented to person, place, and time. He has normal strength.  Skin: Skin is warm and dry. Laceration (Right Hip) noted. No rash noted. He is not diaphoretic. No cyanosis or erythema. No pallor. Nails show no clubbing.  Psychiatric: He has a normal mood and affect. His speech is normal and behavior is normal. Judgment and thought content normal. Cognition and memory are normal.  Nursing note and vitals reviewed.   Labs reviewed: Basic Metabolic Panel:  Recent Labs  16/10/96 0523 12/06/15 0514 12/27/15 0436 12/27/15 1203 12/27/15 1754  NA 135 131* 118* 120* 120*  K 3.5 3.3* 3.7  --   --   CL 99* 100* 87*  --   --   CO2 28 23 23   --   --   GLUCOSE 132* 141* 106*  --   --  BUN 17 20 9   --   --   CREATININE 0.91 0.85 0.59*  --   --   CALCIUM 8.8* 7.8* 9.1  --   --    Liver Function Tests:  Recent Labs  05/21/15 0901 12/04/15 1530 12/27/15 0436  AST 18 37 27  ALT 21 28 25   ALKPHOS 82 71 123  BILITOT 0.5 0.9 0.9  PROT 6.6 7.1 6.6  ALBUMIN 4.4 4.4 4.0   No results for input(s): LIPASE, AMYLASE in the last 8760 hours. No results for input(s): AMMONIA in the last 8760 hours. CBC:  Recent Labs  05/21/15 0901  12/04/15 1530 12/05/15 0523 12/06/15 0514 12/27/15 0436  WBC 7.8  < > 18.5* 14.2* 14.2* 7.6  NEUTROABS 4.8  --  17.1*  --   --   --   HGB  --   < > 13.8 13.7 11.2* 11.4*  HCT 39.6  < > 40.4 39.9* 31.1* 31.8*  MCV 92  < > 93.8 94.6 93.6 90.7  PLT 300  < > 267 241 167 479*  < > = values in this interval not displayed. Cardiac Enzymes:  Recent Labs  12/27/15 0436  TROPONINI <0.03   BNP: Invalid input(s): POCBNP CBG: No results for input(s):  GLUCAP in the last 8760 hours.  Procedures and Imaging Studies During Stay: Dg Chest 1 View  Result Date: 12/04/2015 CLINICAL DATA:  Fall off ladder today. EXAM: CHEST 1 VIEW COMPARISON:  Radiographs of September 02, 2014. FINDINGS: The heart size and mediastinal contours are within normal limits. Both lungs are clear. Atherosclerosis of thoracic aorta is noted. No pneumothorax or pleural effusion is noted. The visualized skeletal structures are unremarkable. IMPRESSION: Aortic atherosclerosis.  No acute cardiopulmonary abnormality seen. Electronically Signed   By: Lupita Raider, M.D.   On: 12/04/2015 15:11   Dg Shoulder Right  Result Date: 12/04/2015 CLINICAL DATA:  Right shoulder injury and pain after 7 foot fall from a ladder today. Initial encounter. EXAM: RIGHT SHOULDER - 2+ VIEW COMPARISON:  None. FINDINGS: The patient has a mildly impacted fracture of the surgical neck of the right humerus with mild anterior displacement. No involvement of the greater tuberosity is identified. No other fracture is seen. The humerus is located and the acromioclavicular joint is intact with moderate degenerative change seen. IMPRESSION: Mildly impacted and anteriorly displaced surgical neck fracture right humerus. Electronically Signed   By: Drusilla Kanner M.D.   On: 12/04/2015 15:09   Ct Head Wo Contrast  Result Date: 12/04/2015 CLINICAL DATA:  Fall from ladder, trauma, injury EXAM: CT HEAD WITHOUT CONTRAST CT CERVICAL SPINE WITHOUT CONTRAST TECHNIQUE: Multidetector CT imaging of the head and cervical spine was performed following the standard protocol without intravenous contrast. Multiplanar CT image reconstructions of the cervical spine were also generated. COMPARISON:  11/02/2012 CT head without contrast FINDINGS: CT HEAD FINDINGS Brain: Age related brain atrophy pattern without acute intracranial hemorrhage, mass lesion, infarction, midline shift, herniation, hydrocephalus, or extra-axial fluid collection.  Normal gray-white matter differentiation. No focal mass effect or edema. Cisterns are patent. Cerebellar atrophy as well. Atherosclerosis of the intracranial vessels noted at the skullbase. Vascular: No hyperdense vessel or unexpected calcification. Skull: Normal. Negative for fracture or focal lesion. Sinuses/Orbits: Minor scattered sinus mucosal thickening. No sinus air-fluid level or definite acute sinusitis. Mastoids remain clear. Other: Orbits are symmetric. CT CERVICAL SPINE FINDINGS Alignment: Straightened cervical spine alignment but remains anatomic. Facets are aligned. No subluxation dislocation. Skull base and vertebrae: No skull  base abnormality or malalignment. Multilevel degenerative spondylosis at all levels with disc space narrowing, sclerosis and osteophytes. Intact odontoid. Degenerative changes at C1-2 articulation as well. Soft tissues and spinal canal: Normal prevertebral soft tissues. No soft tissue asymmetry in the neck. No prevertebral fluid or swelling. No visualize canal hematoma. Carotid atherosclerosis evident. Upper chest: Clear lung apices. Atherosclerosis of the major branch vessels. Thyroid unremarkable. Other: None. IMPRESSION: Brain atrophy without acute intracranial process by noncontrast imaging. Stable exam. Degenerative cervical spondylosis without acute osseous finding or fracture by CT. Electronically Signed   By: Judie Petit.  Shick M.D.   On: 12/04/2015 14:23   Ct Cervical Spine Wo Contrast  Result Date: 12/04/2015 CLINICAL DATA:  Fall from ladder, trauma, injury EXAM: CT HEAD WITHOUT CONTRAST CT CERVICAL SPINE WITHOUT CONTRAST TECHNIQUE: Multidetector CT imaging of the head and cervical spine was performed following the standard protocol without intravenous contrast. Multiplanar CT image reconstructions of the cervical spine were also generated. COMPARISON:  11/02/2012 CT head without contrast FINDINGS: CT HEAD FINDINGS Brain: Age related brain atrophy pattern without acute  intracranial hemorrhage, mass lesion, infarction, midline shift, herniation, hydrocephalus, or extra-axial fluid collection. Normal gray-white matter differentiation. No focal mass effect or edema. Cisterns are patent. Cerebellar atrophy as well. Atherosclerosis of the intracranial vessels noted at the skullbase. Vascular: No hyperdense vessel or unexpected calcification. Skull: Normal. Negative for fracture or focal lesion. Sinuses/Orbits: Minor scattered sinus mucosal thickening. No sinus air-fluid level or definite acute sinusitis. Mastoids remain clear. Other: Orbits are symmetric. CT CERVICAL SPINE FINDINGS Alignment: Straightened cervical spine alignment but remains anatomic. Facets are aligned. No subluxation dislocation. Skull base and vertebrae: No skull base abnormality or malalignment. Multilevel degenerative spondylosis at all levels with disc space narrowing, sclerosis and osteophytes. Intact odontoid. Degenerative changes at C1-2 articulation as well. Soft tissues and spinal canal: Normal prevertebral soft tissues. No soft tissue asymmetry in the neck. No prevertebral fluid or swelling. No visualize canal hematoma. Carotid atherosclerosis evident. Upper chest: Clear lung apices. Atherosclerosis of the major branch vessels. Thyroid unremarkable. Other: None. IMPRESSION: Brain atrophy without acute intracranial process by noncontrast imaging. Stable exam. Degenerative cervical spondylosis without acute osseous finding or fracture by CT. Electronically Signed   By: Judie Petit.  Shick M.D.   On: 12/04/2015 14:23   US Venous Img Lower Bilateral  Result Date: 12/07/2015 CLINICAL DATA:  Leg pain. EXAM: BILATERAL LOWER EXTREMITY VENOUS DOPPLER ULTRASOUND TECHNIQUE: Gray-scale sonography with graded compression, as well as color Doppler and duplex ultrasound were performed to evaluate the lower extremity deep venous systems from the level of the common femoral vein and including the common femoral, femoral, profunda  femoral, popliteal and calf veins including the posterior tibial, peroneal and gastrocnemius veins when visible. The superficial great saphenous vein was also interrogated. Spectral Doppler was utilized to evaluate flow at rest and with distal augmentation maneuvers in the common femoral, femoral and popliteal veins. COMPARISON:  No recent prior. FINDINGS: RIGHT LOWER EXTREMITY Common Femoral Vein: No evidence of thrombus. Normal compressibility, respiratory phasicity and response to augmentation. Saphenofemoral Junction: No evidence of thrombus. Normal compressibility and flow on color Doppler imaging. Profunda Femoral Vein: No evidence of thrombus. Normal compressibility and flow on color Doppler imaging. Femoral Vein: No evidence of thrombus. Normal compressibility, respiratory phasicity and response to augmentation. Popliteal Vein: No evidence of thrombus. Normal compressibility, respiratory phasicity and response to augmentation. Calf Veins: No evidence of thrombus. Normal compressibility and flow on color Doppler imaging. Superficial Great Saphenous Vein: A single peroneal  vein identified . No evidence of thrombus. Normal compressibility and flow on color Doppler imaging. Venous Reflux:  None. Other Findings:  None. LEFT LOWER EXTREMITY Common Femoral Vein: No evidence of thrombus. Normal compressibility, respiratory phasicity and response to augmentation. Saphenofemoral Junction: No evidence of thrombus. Normal compressibility and flow on color Doppler imaging. Profunda Femoral Vein: No evidence of thrombus. Normal compressibility and flow on color Doppler imaging. Femoral Vein: No evidence of thrombus. Normal compressibility, respiratory phasicity and response to augmentation. Popliteal Vein: No evidence of thrombus. Normal compressibility, respiratory phasicity and response to augmentation. Calf Veins: A single peroneal vein identified. No evidence of thrombus. Normal compressibility and flow on color  Doppler imaging. Superficial Great Saphenous Vein: No evidence of thrombus. Normal compressibility and flow on color Doppler imaging. Venous Reflux:  None. Other Findings: This exam was limited due to difficulty in positioning the patient given the patient's clinical condition. IMPRESSION: No evidence of deep venous thrombosis. Electronically Signed   By: Maisie Fus  Register   On: 12/07/2015 14:00   Dg Hip Operative Unilat W Or W/o Pelvis Right  Result Date: 12/05/2015 CLINICAL DATA:  RIGHT femoral neck fracture, RIGHT hip surgery EXAM: OPERATIVE RIGHT HIP (WITH PELVIS IF PERFORMED) 1 VIEWS TECHNIQUE: Fluoroscopic spot image(s) were submitted for interpretation post-operatively. COMPARISON:  Preoperative images 10 05/2015 FLUOROSCOPY TIME:  0 minutes 48 seconds Images obtained: 1 Reference air kerma:  18.9 mGy FINDINGS: Interval resection of RIGHT femoral head/neck. RIGHT hip prosthesis identified. Distal aspect of the femoral stem is not imaged. Bones demineralized. No fracture or dislocation seen. IMPRESSION: RIGHT hip replacement as above. Electronically Signed   By: Ulyses Southward M.D.   On: 12/05/2015 15:25   Dg Hip Unilat W Or W/o Pelvis 2-3 Views Right  Result Date: 12/05/2015 CLINICAL DATA:  Postop right hip replacement EXAM: DG HIP (WITH OR WITHOUT PELVIS) 2-3V RIGHT COMPARISON:  Right hip films of 12/04/2015 FINDINGS: Two views of the right hip show the right hip replacement components to be in good position. No complicating features are seen. IMPRESSION: Right total hip replacement components appear to be in good position. Electronically Signed   By: Dwyane Dee M.D.   On: 12/05/2015 16:00   Dg Hip Unilat  With Pelvis 2-3 Views Right  Result Date: 12/04/2015 CLINICAL DATA:  Fall from ladder, severe right hip pain EXAM: DG HIP (WITH OR WITHOUT PELVIS) 2-3V RIGHT COMPARISON:  12/04/2015 FINDINGS: There is an acute angulated and displaced right hip proximal/subcapital femoral neck fracture. Bones are  osteopenic. No associated subluxation or dislocation. Bony pelvis and left hip appear intact. IMPRESSION: Acute right hip subcapital femoral neck fracture. Osteopenia Electronically Signed   By: Judie Petit.  Shick M.D.   On: 12/04/2015 15:10    Assessment/Plan:   1. Fracture of unspecified part of neck of right femur, subsequent encounter for closed fracture with routine healing  Continue pt/ot services  Continue exercises per therapy recommendations  Tramadol 50 mg 1-2 tablets po Q 4 hours prn pain.  Follow up with pcp and orthopedist asap  Use hemi-walker for ambulation   Patient is being discharged with the following home health services:  Home health pt/ot/sn  Patient is being discharged with the following durable medical equipment:  Hemi-walker  Patient has been advised to f/u with their PCP in 1-2 weeks to bring them up to date on their rehab stay.  Social services at facility was responsible for arranging this appointment.  Pt was provided with a 30 day supply of prescriptions  for medications and refills must be obtained from their PCP.  For controlled substances, a more limited supply may be provided adequate until PCP appointment only.  Future labs/tests needed:  Per pcp  Family/ staff Communication:   Total Time:  Documentation:  Face to Face:  Family/Phone:  Brynda Rim, NP-C Geriatrics [facility] [address], [place] [address] Cell Phone (Mon-Fri 8am-5pm):  [phone] On Call:  [phone] & follow prompts after 5pm & weekends Office Phone:  [phone] Office Fax:  [phone]

## 2015-12-28 DIAGNOSIS — G4700 Insomnia, unspecified: Secondary | ICD-10-CM

## 2015-12-28 DIAGNOSIS — F5101 Primary insomnia: Secondary | ICD-10-CM

## 2015-12-28 DIAGNOSIS — F064 Anxiety disorder due to known physiological condition: Secondary | ICD-10-CM

## 2015-12-28 LAB — CBC
HCT: 27.3 % — ABNORMAL LOW (ref 40.0–52.0)
HEMOGLOBIN: 9.8 g/dL — AB (ref 13.0–18.0)
MCH: 32.7 pg (ref 26.0–34.0)
MCHC: 35.9 g/dL (ref 32.0–36.0)
MCV: 91 fL (ref 80.0–100.0)
Platelets: 372 10*3/uL (ref 150–440)
RBC: 3 MIL/uL — AB (ref 4.40–5.90)
RDW: 13 % (ref 11.5–14.5)
WBC: 7.1 10*3/uL (ref 3.8–10.6)

## 2015-12-28 LAB — BASIC METABOLIC PANEL
ANION GAP: 6 (ref 5–15)
BUN: 7 mg/dL (ref 6–20)
CHLORIDE: 95 mmol/L — AB (ref 101–111)
CO2: 23 mmol/L (ref 22–32)
CREATININE: 0.6 mg/dL — AB (ref 0.61–1.24)
Calcium: 8.3 mg/dL — ABNORMAL LOW (ref 8.9–10.3)
GFR calc non Af Amer: >60 mL/min (ref >60)
GLUCOSE: 97 mg/dL (ref 65–99)
Potassium: 3.9 mmol/L (ref 3.5–5.1)
Sodium: 124 mmol/L — ABNORMAL LOW (ref 135–145)

## 2015-12-28 LAB — SODIUM
SODIUM: 122 mmol/L — AB (ref 135–145)
Sodium: 123 mmol/L — ABNORMAL LOW (ref 135–145)
Sodium: 118 mmol/L — CL (ref 135–145)
Sodium: 119 mmol/L — CL (ref 135–145)

## 2015-12-28 MED ORDER — MIRTAZAPINE 15 MG PO TABS
15.0000 mg | ORAL_TABLET | Freq: Every day | ORAL | Status: DC
  Administered 2015-12-28: 15 mg via ORAL
  Filled 2015-12-28: qty 1

## 2015-12-28 NOTE — Care Management (Signed)
Admitted to [facility] with the diagnosis of hyponatremia. Lives with wife, Lurena JoinerRebecca [phone]). Last seen Dr. Juanetta GoslingHawkins in August 017. [facility] week following hip replacement. [facility] since last Tuesday.  Larey SeatFell off ladder October 2nd on Right side. Decreased po intake for a while. Transfer seat, recliner, and hemi walker in the home. Wants shower chair. Discussed at insurance doesn't usually pay for equipment that is used in the bathroom. Wife helps with some basic activities of daily living (dressing & baths).  Pres[facility] [place]. Wife will transport. Physical therapy evaluation completed. Recommending home with home health/therapy. Would like to be continued to be followed by [facility]. Gwenette GreetBrenda S Pheonix Wisby RN MSN CCM Care Management [phone]

## 2015-12-28 NOTE — Care Management Important Message (Signed)
Important Message  Patient Details  Name: Crista ElliotWalter D Menger MRN: [id number] Date of Birth: [birth year]   Medicare Important Message Given:  Yes    Gwenette GreetBrenda S Mattheus Rauls, RN 12/28/2015, 8:14 AM

## 2015-12-28 NOTE — Consult Note (Signed)
[facility] Face-to-Face Psychiatry Consult   Reason for Consult:  Consult for 81 year old man with a history of depression. Consult because of anxiety depression and difficulty sleeping Referring Physician:  [place] Patient Identification: Erik Palmer MRN:  [id number] Principal Diagnosis: <principal problem not specified> Diagnosis:   Patient Active Problem List   Diagnosis Date Noted  . Insomnia [G47.00] 12/28/2015  . Fracture of unspecified part of neck of right femur, subsequent encounter for closed fracture with routine healing [S72.001D] 12/27/2015  . Closed right hip fracture, initial encounter ([place]) [S72.001A] 12/04/2015  . Hyperlipidemia [E78.5] 05/08/2015  . HBP (high blood pressure) [I10] 02/13/2015  . Morbid obesity ([place]) [E66.01] 12/22/2014  . Medicare annual wellness visit, subsequent [Z00.00] 12/22/2014  . Acute anxiety [F41.9] 09/12/2014  . Grief at loss of child [F43.21, Z63.4] 09/03/2014  . Anxiety disorder due to general medical condition [F06.4] 09/03/2014  . Hyponatremia [E87.1] 09/02/2014    Total Time spent with patient: 1 hour  Subjective:   Erik Palmer is a 81 y.o. male patient admitted with "I'm just not feeling well".  HPI:  81 year old man currently in the hospital for hyponatremia status post some orthopedic surgery earlier in the month. Patient is complaining of a mixture of depression and anxiety and insomnia. The insomnia is the most pressing complaint. He says he hasn't been able to sleep well at all ever since first coming in the hospital for his orthopedic surgery. He says he only sleeps 2 or 3 hours a night despite still being on the Seroquel he was taking at home. Patient says his mood is bad but he is not describing anhedonia and does not have any hopelessness. Denies any suicidal ideation. Denies any psychosis. He appears to be nervous and anxious. He knows this has been a long-standing issue with him. He tends to focus on various somatic problems and  then get overly worried about them. Apparently he was prescribed trazodone while he was at [facility] and even though it has long been discontinued he is blaming his lack of sexual desire on it. Wife reports that she doesn't think the lack of sexual desire is anything new.  Social history: Married. Lives at home with his wife. Patient has quite a few members of his family all in the room actively involved during the interview and appears to like it that way.  Medical history: Had some orthopedic fractures and had surgery earlier in the month. Has been at [facility] recovering.  Substance abuse history: No history of past substance abuse  Past Psychiatric History: Patient has no history of psychiatric hospitalization. No history of suicide attempts or violence. He has been treated by Dr. Gretel Acre as an outpatient for depression and anxiety with a modest dose of Seroquel. Patient can't recall if he's ever been on any other antidepressant medicine.  Risk to Self: Is patient at risk for suicide?: No Risk to Others:   Prior Inpatient Therapy:   Prior Outpatient Therapy:    Past Medical History:  Past Medical History:  Diagnosis Date  . Angina pectoris syndrome ([place])   . 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 Arthritis   . GERD (gastroesophageal reflux disease)   . Grief at loss of child 09/03/2014  . Hypercholesteremia   . Hypertension   . Prostate enlargement     Past Surgical History:  Procedure Laterality Date  . TOTAL HIP ARTHROPLASTY Right 12/05/2015   Procedure: TOTAL HIP ARTHROPLASTY ANTERIOR APPROACH;  Surgeon: Hessie Knows, MD;  Location:  [facility] ORS;  Service: Orthopedics;  Laterality: Right;   Family History:  Family History  Problem Relation Age of Onset  . Hypertension Mother   . Heart block Mother   . Stroke Mother   . Depression Mother   . Cancer Father   . Heart attack Sister   . COPD Sister   . Hypertension Brother   . Hypertension Brother   .  Hypertension Brother   . Hypertension Brother   . Heart attack Brother   . Diabetes Neg Hx    Family Psychiatric  History: No identified family history Social History:  History  Alcohol Use No     History  Drug Use No    Social History   Social History  . Marital status: Married    Spouse name: N/A  . Number of children: N/A  . Years of education: N/A   Social History Main Topics  . Smoking status: Never Smoker  . Smokeless tobacco: Never Used  . Alcohol use No  . Drug use: No  . Sexual activity: Not Currently   Other Topics Concern  . None   Social History Narrative  . None   Additional Social History:    Allergies:   Allergies  Allergen Reactions  . Solu-Medrol [Methylprednisolone Acetate] Anxiety    Labs:  Results for orders placed or performed during the hospital encounter of 12/27/15 (from the past 48 hour(s))  CBC     Status: Abnormal   Collection Time: 12/27/15  4:36 AM  Result Value Ref Range   WBC 7.6 3.8 - 10.6 K/uL   RBC 3.50 (L) 4.40 - 5.90 MIL/uL   Hemoglobin 11.4 (L) 13.0 - 18.0 g/dL   HCT 50.4 (L) 03.0 - 60.6 %   MCV 90.7 80.0 - 100.0 fL   MCH 32.5 26.0 - 34.0 pg   MCHC 35.8 32.0 - 36.0 g/dL   RDW 71.5 19.5 - 11.1 %   Platelets 479 (H) 150 - 440 K/uL  Comprehensive metabolic panel     Status: Abnormal   Collection Time: 12/27/15  4:36 AM  Result Value Ref Range   Sodium 118 (LL) 135 - 145 mmol/L    Comment: CRITICAL RESULT CALLED TO, READ BACK BY AND VERIFIED WITH  [facility] YUAL AT 0559 12/27/15 SDR    Potassium 3.7 3.5 - 5.1 mmol/L   Chloride 87 (L) 101 - 111 mmol/L   CO2 23 22 - 32 mmol/L   Glucose, Bld 106 (H) 65 - 99 mg/dL   BUN 9 6 - 20 mg/dL   Creatinine, Ser 3.56 (L) 0.61 - 1.24 mg/dL   Calcium 9.1 8.9 - 52.7 mg/dL   Total Protein 6.6 6.5 - 8.1 g/dL   Albumin 4.0 3.5 - 5.0 g/dL   AST 27 15 - 41 U/L   ALT 25 17 - 63 U/L   Alkaline Phosphatase 123 38 - 126 U/L   Total Bilirubin 0.9 0.3 - 1.2 mg/dL   GFR calc non Af Amer >60  >60 mL/min   GFR calc Af Amer >60 >60 mL/min    Comment: (NOTE) The eGFR has been calculated using the CKD EPI equation. This calculation has not been validated in all clinical situations. eGFR's persistently <60 mL/min signify possible Chronic Kidney Disease.    Anion gap 8 5 - 15  Troponin I     Status: None   Collection Time: 12/27/15  4:36 AM  Result Value Ref Range   Troponin I <0.03 <0.03 ng/mL  Urinalysis  complete, with microscopic ([facility] only)     Status: Abnormal   Collection Time: 12/27/15  7:53 AM  Result Value Ref Range   Color, Urine YELLOW (A) YELLOW   APPearance CLEAR (A) CLEAR   Glucose, UA NEGATIVE NEGATIVE mg/dL   Bilirubin Urine NEGATIVE NEGATIVE   Ketones, ur TRACE (A) NEGATIVE mg/dL   Specific Gravity, Urine 1.013 1.005 - 1.030   Hgb urine dipstick NEGATIVE NEGATIVE   pH 6.0 5.0 - 8.0   Protein, ur NEGATIVE NEGATIVE mg/dL   Nitrite NEGATIVE NEGATIVE   Leukocytes, UA 2+ (A) NEGATIVE   RBC / HPF 0-5 0 - 5 RBC/hpf   WBC, UA TOO NUMEROUS TO COUNT 0 - 5 WBC/hpf   Bacteria, UA RARE (A) NONE SEEN   Squamous Epithelial / LPF 0-5 (A) NONE SEEN   Mucous PRESENT   Sodium, urine, random     Status: None   Collection Time: 12/27/15  7:53 AM  Result Value Ref Range   Sodium, Ur 43 mmol/L  Creatinine, urine, random     Status: None   Collection Time: 12/27/15  7:53 AM  Result Value Ref Range   Creatinine, Urine 154 mg/dL  Sodium     Status: Abnormal   Collection Time: 12/27/15 12:03 PM  Result Value Ref Range   Sodium 120 (L) 135 - 145 mmol/L  Sodium     Status: Abnormal   Collection Time: 12/27/15  5:54 PM  Result Value Ref Range   Sodium 120 (L) 135 - 145 mmol/L  Sodium     Status: Abnormal   Collection Time: 12/27/15 11:51 PM  Result Value Ref Range   Sodium 122 (L) 135 - 145 mmol/L  Basic metabolic panel     Status: Abnormal   Collection Time: 12/28/15  6:10 AM  Result Value Ref Range   Sodium 124 (L) 135 - 145 mmol/L   Potassium 3.9 3.5 - 5.1 mmol/L    Chloride 95 (L) 101 - 111 mmol/L   CO2 23 22 - 32 mmol/L   Glucose, Bld 97 65 - 99 mg/dL   BUN 7 6 - 20 mg/dL   Creatinine, Ser 0.60 (L) 0.61 - 1.24 mg/dL   Calcium 8.3 (L) 8.9 - 10.3 mg/dL   GFR calc non Af Amer >60 >60 mL/min   GFR calc Af Amer >60 >60 mL/min    Comment: (NOTE) The eGFR has been calculated using the CKD EPI equation. This calculation has not been validated in all clinical situations. eGFR's persistently <60 mL/min signify possible Chronic Kidney Disease.    Anion gap 6 5 - 15  CBC     Status: Abnormal   Collection Time: 12/28/15  6:10 AM  Result Value Ref Range   WBC 7.1 3.8 - 10.6 K/uL   RBC 3.00 (L) 4.40 - 5.90 MIL/uL   Hemoglobin 9.8 (L) 13.0 - 18.0 g/dL   HCT 27.3 (L) 40.0 - 52.0 %   MCV 91.0 80.0 - 100.0 fL   MCH 32.7 26.0 - 34.0 pg   MCHC 35.9 32.0 - 36.0 g/dL   RDW 13.0 11.5 - 14.5 %   Platelets 372 150 - 440 K/uL  Sodium     Status: Abnormal   Collection Time: 12/28/15  6:10 AM  Result Value Ref Range   Sodium 123 (L) 135 - 145 mmol/L    Current Facility-Administered Medications  Medication Dose Route Frequency Provider Last Rate Last Dose  . 0.9 % NaCl with KCl 20 mEq/ L  infusion   Intravenous Continuous Hillary Bow, MD 75 mL/hr at 12/28/15 1328    . acetaminophen (TYLENOL) tablet 650 mg  650 mg Oral Q6H PRN Hillary Bow, MD       Or  . acetaminophen (TYLENOL) suppository 650 mg  650 mg Rectal Q6H PRN Srikar [name], MD      . albuterol (PROVENTIL) (2.5 MG/3ML) 0.083% nebulizer solution 2.5 mg  2.5 mg Nebulization Q2H PRN Srikar [name], MD      . aspirin EC tablet 81 mg  81 mg Oral q morning - 10a Hillary Bow, MD   81 mg at 12/28/15 0909  . atorvastatin (LIPITOR) tablet 40 mg  40 mg Oral q1800 Hillary Bow, MD   40 mg at 12/27/15 1721  . bisacodyl (DULCOLAX) suppository 10 mg  10 mg Rectal Daily PRN Hillary Bow, MD   10 mg at 12/27/15 1333  . cefTRIAXone (ROCEPHIN) IVPB 1 g  1 g Intravenous Q24H Hillary Bow, MD   1 g at 12/28/15 0903   . docusate sodium (COLACE) capsule 100 mg  100 mg Oral BID Hillary Bow, MD   100 mg at 12/28/15 0909  . enoxaparin (LOVENOX) injection 40 mg  40 mg Subcutaneous Q24H Hillary Bow, MD   40 mg at 12/28/15 0909  . feeding supplement (ENSURE ENLIVE) (ENSURE ENLIVE) liquid 237 mL  237 mL Oral BID BM Srikar [name], MD   237 mL at 12/28/15 1400  . fluticasone (FLONASE) 50 MCG/ACT nasal spray 2 spray  2 spray Each Nare Daily Hillary Bow, MD   2 spray at 12/28/15 0909  . ketorolac (TORADOL) 15 MG/ML injection 15 mg  15 mg Intravenous Q8H PRN Hillary Bow, MD   15 mg at 12/28/15 1328  . lisinopril (PRINIVIL,ZESTRIL) tablet 20 mg  20 mg Oral Daily Hillary Bow, MD   20 mg at 12/28/15 0909  . LORazepam (ATIVAN) tablet 0.5 mg  0.5 mg Oral Q12H PRN Hillary Bow, MD   0.5 mg at 12/28/15 1609  . metoprolol tartrate (LOPRESSOR) tablet 25 mg  25 mg Oral BID Hillary Bow, MD   25 mg at 12/28/15 0909  . mirtazapine (REMERON) tablet 15 mg  15 mg Oral QHS Gonzella Lex, MD      . naproxen (NAPROSYN) tablet 250 mg  250 mg Oral QID PRN Hillary Bow, MD   250 mg at 12/27/15 1720  . ondansetron (ZOFRAN) tablet 4 mg  4 mg Oral Q6H PRN Hillary Bow, MD   4 mg at 12/27/15 1720   Or  . ondansetron (ZOFRAN) injection 4 mg  4 mg Intravenous Q6H PRN Srikar [name], MD      . pantoprazole (PROTONIX) EC tablet 40 mg  40 mg Oral BID Hillary Bow, MD   40 mg at 12/28/15 0909  . polyethylene glycol (MIRALAX / GLYCOLAX) packet 17 g  17 g Oral Daily PRN Srikar [name], MD      . QUEtiapine (SEROQUEL) tablet 50 mg  50 mg Oral QHS Hillary Bow, MD   50 mg at 12/27/15 2059  . tamsulosin (FLOMAX) capsule 0.4 mg  0.4 mg Oral QPM Srikar [name], MD   0.4 mg at 12/27/15 1721    Musculoskeletal: Strength & Muscle Tone: within normal limits Gait & Station: normal Patient leans: N/A  Psychiatric Specialty Exam: Physical Exam  Nursing note and vitals reviewed. Constitutional: He appears well-developed and well-nourished.   HENT:  Head: Normocephalic and atraumatic.  Eyes: Conjunctivae are normal. Pupils are equal, round, and reactive  to light.  Neck: Normal range of motion.  Cardiovascular: Regular rhythm and normal heart sounds.   Respiratory: Effort normal. No respiratory distress.  GI: Soft.  Musculoskeletal: Normal range of motion.  Neurological: He is alert.  Skin: Skin is warm and dry.  Psychiatric: His speech is normal and behavior is normal. Judgment and thought content normal. His mood appears anxious. Cognition and memory are normal.    Review of Systems  Constitutional: Negative.   HENT: Negative.   Eyes: Negative.   Respiratory: Negative.   Cardiovascular: Negative.   Gastrointestinal: Negative.   Genitourinary: Positive for dysuria.  Musculoskeletal: Negative.   Skin: Negative.   Neurological: Negative.   Psychiatric/Behavioral: Positive for depression. Negative for hallucinations, memory loss, substance abuse and suicidal ideas. The patient is nervous/anxious and has insomnia.     Blood pressure (!) 108/58, pulse 74, temperature 98 F (36.7 C), temperature source Oral, resp. rate 16, height '5\' 9"'$  (1.753 m), weight 109.4 kg (241 lb 3.2 oz), SpO2 100 %.Body mass index is 35.62 kg/m.  General Appearance: Casual  Eye Contact:  Good  Speech:  Slow  Volume:  Decreased  Mood:  Anxious  Affect:  Congruent  Thought Process:  Goal Directed  Orientation:  Full (Time, Place, and Person)  Thought Content:  Logical  Suicidal Thoughts:  No  Homicidal Thoughts:  No  Memory:  Immediate;   Good Recent;   Fair Remote;   Fair  Judgement:  Fair  Insight:  Fair  Psychomotor Activity:  Normal  Concentration:  Concentration: Fair  Recall:  [employer] of Knowledge:  Fair  Language:  Fair  Akathisia:  No  Handed:  Right  AIMS (if indicated):     Assets:  Communication Skills Desire for Improvement Financial Resources/Insurance Housing Intimacy Leisure Time Physical  Health Resilience Social Support  ADL's:  Intact  Cognition:  WNL  Sleep:        Treatment Plan Summary: Medication management and Plan 81 year old man with some insomnia and decreased appetite and anxiety and mild bleed depressed mood. No evidence of psychosis no dangerousness. I reassured him that a brief spell of taking trazodone is not going to be related to any long-lasting symptoms. Gave some supportive counseling about his anxiety and depression with reassurance that he should feel better for too long. I am suggesting adding 15 mg of mirtazapine at night to assist with sleep and appetite. Patient is agreeable. Orders done. We'll follow-up as needed.  Disposition: Patient does not meet criteria for psychiatric inpatient admission.  Alethia Berthold, MD 12/28/2015 5:30 PM

## 2015-12-28 NOTE — Progress Notes (Signed)
Physical Therapy Treatment Patient Details Name: Erik Palmer MRN: [id number] DOB: 01/15/1935 Today's Date: 12/28/2015    History of Present Illness Pt is a 81 y/o M who presented to the emergency room due to anxiety and insomnia.  Discovered severe hyponatremia with workup.  Pt's PMH includes recent hip fx s/p R THA anterior approach, R humerus fx in sling, angina.      PT Comments    [name] is making good progress and was open to instructions on proper use of hemi walker.  With improved technique he is able to achieve an upright posture and forward gaze. Pt will benefit from continued skilled PT services to increase functional independence and safety.   Follow Up Recommendations  Home health PT;Supervision for mobility/OOB     Equipment Recommendations  Other (comment) (TBD by HHPT; likely will initiate cane training soon)    Recommendations for Other Services       Precautions / Restrictions Precautions Precautions: Fall Required Braces or Orthoses: Sling (RUE) Restrictions Weight Bearing Restrictions: Yes RUE Weight Bearing: Non weight bearing RLE Weight Bearing: Weight bearing as tolerated Other Position/Activity Restrictions: Pt reports he is still NWB RUE but has been told he can dangle    Mobility  Bed Mobility               General bed mobility comments: Pt sitting in recliner chair upon PT arrival  Transfers Overall transfer level: Needs assistance Equipment used: Hemi-walker Transfers: Sit to/from Stand Sit to Stand: Supervision         General transfer comment: Supervision for safety  Ambulation/Gait Ambulation/Gait assistance: Supervision Ambulation Distance (Feet): 300 Feet Assistive device: Hemi-walker Gait Pattern/deviations: Step-to pattern;Step-through pattern;Decreased stride length Gait velocity: decreased Gait velocity interpretation: Below normal speed for age/gender General Gait Details: Pt open to instruction on proper use  of hemi walker today and as a result demonstrates improved upright posture and transitions to a step through gait pattern.     Stairs            Wheelchair Mobility    Modified Rankin (Stroke Patients Only)       Balance Overall balance assessment: Needs assistance Sitting-balance support: No upper extremity supported;Feet supported Sitting balance-Leahy Scale: Good     Standing balance support: No upper extremity supported;During functional activity Standing balance-Leahy Scale: Fair Standing balance comment: Pt able to stand statically without UE support but requires UE support for dynamic activities                    Cognition Arousal/Alertness: Awake/alert Behavior During Therapy: WFL for tasks assessed/performed Overall Cognitive Status: Within Functional Limits for tasks assessed                      Exercises General Exercises - Lower Extremity Ankle Circles/Pumps: AROM;Both;10 reps;Seated Long Arc Quad: AROM;Both;10 reps;Seated Hip Flexion/Marching: Both;10 reps;Seated    General Comments        Pertinent Vitals/Pain Pain Assessment: 0-10 Pain Score: 2  Pain Location: generalized Pain Descriptors / Indicators: Discomfort Pain Intervention(s): Limited activity within patient's tolerance;Monitored during session    Home Living                      Prior Function            PT Goals (current goals can now be found in the care plan section) Acute Rehab PT Goals Patient Stated Goal: to go home PT Goal  Formulation: With patient Time For Goal Achievement: 01/03/16 Potential to Achieve Goals: Good Progress towards PT goals: Progressing toward goals    Frequency    Min 2X/week      PT Plan Current plan remains appropriate    Co-evaluation             End of Session Equipment Utilized During Treatment: Gait belt Activity Tolerance: Patient tolerated treatment well Patient left: in chair;with call bell/phone  within reach;with chair alarm set     Time: [id number] PT Time Calculation (min) (ACUTE ONLY): 30 min  Charges:  $Gait Training: 8-22 mins $Therapeutic Exercise: 8-22 mins                    G Codes:       Encarnacion ChuAshley Abashian PT, DPT 12/28/2015, 12:49 PM

## 2015-12-28 NOTE — Progress Notes (Signed)
[facility] [place] [facility]   PATIENT NAME: Xaidyn Kepner    MR#:  [id number]  DATE OF BIRTH:  12/07/1934  SUBJECTIVE:  CHIEF COMPLAINT:   Chief Complaint  Patient presents with  . Anxiety   Feels better. Still restless. Poor appetite. Trying to drink ensure 2 times a day.  REVIEW OF SYSTEMS:    Review of Systems  Constitutional: Positive for malaise/fatigue and weight loss. Negative for chills and fever.  HENT: Negative for sore throat.   Eyes: Negative for blurred vision, double vision and pain.  Respiratory: Negative for cough, hemoptysis, shortness of breath and wheezing.   Cardiovascular: Negative for chest pain, palpitations, orthopnea and leg swelling.  Gastrointestinal: Negative for abdominal pain, constipation, diarrhea, heartburn, nausea and vomiting.  Genitourinary: Negative for dysuria and hematuria.  Musculoskeletal: Positive for back pain and joint pain.  Skin: Negative for rash.  Neurological: Positive for weakness. Negative for sensory change, speech change, focal weakness and headaches.  Endo/Heme/Allergies: Does not bruise/bleed easily.  Psychiatric/Behavioral: Positive for depression. The patient is nervous/anxious.     DRUG ALLERGIES:   Allergies  Allergen Reactions  . Solu-Medrol [Methylprednisolone Acetate] Anxiety    VITALS:  Blood pressure 132/62, pulse 87, temperature 98.6 F (37 C), temperature source Oral, resp. rate 16, height 5\' 9"  (1.753 m), weight 109.4 kg (241 lb 3.2 oz), SpO2 99 %.  PHYSICAL EXAMINATION:   Physical Exam  GENERAL:  81 y.o.-year-old patient lying in the bed with no acute distress.  EYES: Pupils equal, round, reactive to light and accommodation. No scleral icterus. Extraocular muscles intact.  HEENT: Head atraumatic, normocephalic. Oropharynx and nasopharynx clear.  NECK:  Supple, no jugular venous distention. No thyroid enlargement, no tenderness.  LUNGS: Normal breath sounds bilaterally, no  wheezing, rales, rhonchi. No use of accessory muscles of respiration.  CARDIOVASCULAR: S1, S2 normal. No murmurs, rubs, or gallops.  ABDOMEN: Soft, nontender, nondistended. Bowel sounds present. No organomegaly or mass.  EXTREMITIES: No cyanosis, clubbing or edema b/l.    NEUROLOGIC: Cranial nerves II through XII are intact. No focal Motor or sensory deficits b/l.   PSYCHIATRIC: The patient is alert and oriented x 3.  SKIN: No obvious rash, lesion, or ulcer.   Right arm sling  LABORATORY PANEL:   CBC  Recent Labs Lab 12/28/15 0610  WBC 7.1  HGB 9.8*  HCT 27.3*  PLT 372   ------------------------------------------------------------------------------------------------------------------ Chemistries   Recent Labs Lab 12/27/15 0436  12/28/15 0610  NA 118*  < > 124*  123*  K 3.7  --  3.9  CL 87*  --  95*  CO2 23  --  23  GLUCOSE 106*  --  97  BUN 9  --  7  CREATININE 0.59*  --  0.60*  CALCIUM 9.1  --  8.3*  AST 27  --   --   ALT 25  --   --   ALKPHOS 123  --   --   BILITOT 0.9  --   --   < > = values in this interval not displayed. ------------------------------------------------------------------------------------------------------------------  Cardiac Enzymes  Recent Labs Lab 12/27/15 0436  TROPONINI <0.03   ------------------------------------------------------------------------------------------------------------------  RADIOLOGY:  No results found.   ASSESSMENT AND PLAN:   * Severe hyponatremia Likely dehydration and decreased solute load. Hydrochlorothiazide stopped Continue IV fluids today. Encouraged to eat better. Monitor I/Os and daily weight Serial Na.  * Depression/anxiety Discussed with patient's family. Concerned about his anxiety, weight loss. Patient was on  Lexapro in the past which was stopped due to low sodium and 2016. [facility]. Will need outpatient follow-up.  * UTI On ceftriaxone. Cultures pending.  *  HTN Continue metoprolol and Lisinopril Hold HCTZ  * Recent hip fracture Resolved  * DVT prophylaxis Lovenox  All the records are reviewed and case discussed with Care Management/Social Workerr. Management plans discussed with the patient, family and they are in agreement.  CODE STATUS: FULL CODE  DVT Prophylaxis: SCDs  TOTAL TIME TAKING CARE OF THIS PATIENT: 35 minutes.   POSSIBLE D/C IN 1-2 DAYS, DEPENDING ON CLINICAL CONDITION.  Milagros LollSudini, Yoanna Jurczyk R M.D on 12/28/2015 at 12:55 PM  Between 7am to 6pm - Pager - [phone]  After 6pm go to [email or web address] - password [email or web address] [facility]  [facility]  Office  [phone]  CC: Primary care physician; Fidel LevyJames Hawkins Jr, MD  Note: This dictation was prepared with Dragon dictation along with smaller phrase technology. Any transcriptional errors that result from this process are unintentional.

## 2015-12-28 NOTE — Progress Notes (Signed)
Initial Nutrition Assessment  DOCUMENTATION CODES:   Non-severe (moderate) malnutrition in context of acute illness/injury  INTERVENTION:  -Agree with adding Ensure Enlive po BID, each supplement provides 350 kcal and 20 grams of protein -Encouraged intake of small frequent, high calorie high protein foods during acute illness.      NUTRITION DIAGNOSIS:   Malnutrition related to acute illness, poor appetite as evidenced by percent weight loss, energy intake < or equal to 50% for > or equal to 5 days.    GOAL:   Patient will meet greater than or equal to 90% of their needs    MONITOR:   PO intake, Supplement acceptance, Weight trends  REASON FOR ASSESSMENT:   Malnutrition Screening Tool    ASSESSMENT:     81 y.o male admitted with hyponatremia.  Pt s/p hip fracture on 10/3 after fall and right humerus fracture. [facility]  Recently came home the first part of this week.  Wife at bedside and reports pt appetite has been decreased since 10/3 after fall, surgery, etc.  Reports wt loss of 28 pounds initially but has gained some of that back.   Reports UBW of 253 pounds in 10/3, now 241 (5% wt loss in the last month)  Ate good breakfast this am (oatmeal, egg, juice milk, toast, ensure).    Medications reviewed: colace, NS with KCl  Labs reviewed: Na 124    Diet Order:  Diet regular Room service appropriate? Yes; Fluid consistency: Thin  Skin:  Reviewed, no issues  Last BM:  10/26  Height:   Ht Readings from Last 1 Encounters:  12/27/15 5\' 9"  (1.753 m)    Weight:   Wt Readings from Last 1 Encounters:  12/28/15 241 lb 3.2 oz (109.4 kg)    Ideal Body Weight:     BMI:  Body mass index is 35.62 kg/m.  Estimated Nutritional Needs:   Kcal:  2200-2500 kcals/d  Protein:  109-130 g/d  Fluid:  >/=2.2 L/d  EDUCATION NEEDS:   Education needs addressed  Palin Tristan B. Freida BusmanAllen, RD, LDN [phone] (pager) Weekend/On-Call pager  [phone])

## 2015-12-28 NOTE — Progress Notes (Signed)
While rounding, CH made initial visit to room 107. Pt was sitting in chair and wife was bedside. Pt said he felt better than when he first was admitted. Pt spoke of a replaced hip, rehab, and low sodium. (Low sodium is why he is here) Pt and wife requested prayer for healing which was provided. CH is available for follow up as needed.    12/28/15 1100  Clinical Encounter Type  Visited With Patient;Patient and family together  Visit Type Initial;Spiritual support  Referral From Nurse  Spiritual Encounters  Spiritual Needs Prayer

## 2015-12-29 LAB — SODIUM
SODIUM: 123 mmol/L — AB (ref 135–145)
SODIUM: 123 mmol/L — AB (ref 135–145)

## 2015-12-29 LAB — OSMOLALITY, URINE: Osmolality, Ur: 260 mOsm/kg — ABNORMAL LOW (ref 300–900)

## 2015-12-29 LAB — URIC ACID: URIC ACID, SERUM: 3.3 mg/dL — AB (ref 4.4–7.6)

## 2015-12-29 LAB — OSMOLALITY: Osmolality: 258 mOsm/kg — ABNORMAL LOW (ref 275–295)

## 2015-12-29 LAB — SODIUM, URINE, RANDOM: Sodium, Ur: 50 mmol/L

## 2015-12-29 LAB — CORTISOL: Cortisol, Plasma: 11.7 ug/dL

## 2015-12-29 LAB — TSH: TSH: 1.872 u[IU]/mL (ref 0.350–4.500)

## 2015-12-29 MED ORDER — QUETIAPINE FUMARATE 25 MG PO TABS
50.0000 mg | ORAL_TABLET | Freq: Every day | ORAL | Status: DC
  Administered 2015-12-29 – 2015-12-30 (×2): 50 mg via ORAL
  Filled 2015-12-29 (×2): qty 2

## 2015-12-29 MED ORDER — LORAZEPAM 1 MG PO TABS
1.0000 mg | ORAL_TABLET | Freq: Every day | ORAL | Status: DC
  Administered 2015-12-29 – 2015-12-30 (×2): 1 mg via ORAL
  Filled 2015-12-29 (×2): qty 1

## 2015-12-29 MED ORDER — MIRTAZAPINE 15 MG PO TABS
15.0000 mg | ORAL_TABLET | Freq: Every day | ORAL | Status: DC
  Administered 2015-12-29 – 2015-12-30 (×2): 15 mg via ORAL
  Filled 2015-12-29 (×2): qty 1

## 2015-12-29 NOTE — Progress Notes (Signed)
[facility] - [place] [facility]   PATIENT NAME: Erik Palmer    MR#:  [id number]  DATE OF BIRTH:  [birth year]  SUBJECTIVE:  CHIEF COMPLAINT:   Chief Complaint  Patient presents with  . Anxiety   Started on remeron yesterday. Did not sleep well Feels well otherwise. Wife at bedside.  REVIEW OF SYSTEMS:    Review of Systems  Constitutional: Positive for malaise/fatigue and weight loss. Negative for chills and fever.  HENT: Negative for sore throat.   Eyes: Negative for blurred vision, double vision and pain.  Respiratory: Negative for cough, hemoptysis, shortness of breath and wheezing.   Cardiovascular: Negative for chest pain, palpitations, orthopnea and leg swelling.  Gastrointestinal: Negative for abdominal pain, constipation, diarrhea, heartburn, nausea and vomiting.  Genitourinary: Negative for dysuria and hematuria.  Musculoskeletal: Positive for back pain and joint pain.  Skin: Negative for rash.  Neurological: Positive for weakness. Negative for sensory change, speech change, focal weakness and headaches.  Endo/Heme/Allergies: Does not bruise/bleed easily.  Psychiatric/Behavioral: Positive for depression. The patient is nervous/anxious.     DRUG ALLERGIES:   Allergies  Allergen Reactions  . Solu-Medrol [Methylprednisolone Acetate] Anxiety    VITALS:  Blood pressure (!) 112/51, pulse 92, temperature 98 F (36.7 C), temperature source Oral, resp. rate 16, height 5\' 9"  (1.753 m), weight 111.6 kg (246 lb 1.6 oz), SpO2 98 %.  PHYSICAL EXAMINATION:   Physical Exam  GENERAL:  81 y.o.-year-old patient lying in the bed with no acute distress.  EYES: Pupils equal, round, reactive to light and accommodation. No scleral icterus. Extraocular muscles intact.  HEENT: Head atraumatic, normocephalic. Oropharynx and nasopharynx clear.  NECK:  Supple, no jugular venous distention. No thyroid enlargement, no tenderness.  LUNGS: Normal breath sounds  bilaterally, no wheezing, rales, rhonchi. No use of accessory muscles of respiration.  CARDIOVASCULAR: S1, S2 normal. No murmurs, rubs, or gallops.  ABDOMEN: Soft, nontender, nondistended. Bowel sounds present. No organomegaly or mass.  EXTREMITIES: No cyanosis, clubbing or edema b/l.    NEUROLOGIC: Cranial nerves II through XII are intact. No focal Motor or sensory deficits b/l.   PSYCHIATRIC: The patient is alert and oriented x 3.  SKIN: No obvious rash, lesion, or ulcer.   Right arm sling  LABORATORY PANEL:   CBC  Recent Labs Lab 12/28/15 0610  WBC 7.1  HGB 9.8*  HCT 27.3*  PLT 372   ------------------------------------------------------------------------------------------------------------------ Chemistries   Recent Labs Lab 12/27/15 0436  12/28/15 0610  12/29/15 0553  NA 118*  < > 124*  123*  < > 123*  K 3.7  --  3.9  --   --   CL 87*  --  95*  --   --   CO2 23  --  23  --   --   GLUCOSE 106*  --  97  --   --   BUN 9  --  7  --   --   CREATININE 0.59*  --  0.60*  --   --   CALCIUM 9.1  --  8.3*  --   --   AST 27  --   --   --   --   ALT 25  --   --   --   --   ALKPHOS 123  --   --   --   --   BILITOT 0.9  --   --   --   --   < > = values in this  interval not displayed. ------------------------------------------------------------------------------------------------------------------  Cardiac Enzymes  Recent Labs Lab 12/27/15 0436  TROPONINI <0.03   ------------------------------------------------------------------------------------------------------------------  RADIOLOGY:  No results found.   ASSESSMENT AND PLAN:   * Severe hyponatremia - worsening Likely dehydration and decreased solute load. Hydrochlorothiazide stopped Stop normal saline Encouraged to eat better. Monitor I/Os and daily weight Serial Na.  Discussed with Dr. Cherylann RatelLateef of nephrology due to worsening sodium.  * Depression/anxiety Discussed with patient's family. Concerned about  his anxiety, weight loss. Patient was on Lexapro in the past which was stopped due to low sodium and 2016. Consulted psychiatry. Appreciate input. Started on Remeron.  * UTI On ceftriaxone.   * HTN Continue metoprolol and Lisinopril Hold HCTZ  * Recent hip fracture Resolved  * DVT prophylaxis Lovenox  All the records are reviewed and case discussed with Care Management/Social Workerr. Management plans discussed with the patient, family and they are in agreement.  CODE STATUS: FULL CODE  DVT Prophylaxis: SCDs  TOTAL TIME TAKING CARE OF THIS PATIENT: 35 minutes.   POSSIBLE D/C IN 1-2 DAYS, DEPENDING ON CLINICAL CONDITION.  Milagros LollSudini, [name] M.D on 12/29/2015 at 11:24 AM  Between 7am to 6pm - Pager - [phone]  After 6pm go to [email or web address] - password [email or web address] [facility]  [facility]  Office  [phone]  CC: Primary care physician; Fidel LevyJames Hawkins Jr, MD  Note: This dictation was prepared with Dragon dictation along with smaller phrase technology. Any transcriptional errors that result from this process are unintentional.

## 2015-12-29 NOTE — Progress Notes (Signed)
MD aware of sodium results  

## 2015-12-29 NOTE — Consult Note (Signed)
[facility] CONSULT NOTE    Date: 12/29/2015                  Patient Name:  Erik Palmer  MRN: [id number]  DOB: September 15, 1934  Age / Sex: 81 y.o., male         PCP: Fidel Levy, MD                 Service Requesting Consult: Dr. Elpidio Anis                 Reason for Consult: hyponatremia            History of Present Illness: Patient is a 81 y.o. male with a PMHx of Hypertension, recent right hip fracture, right humerus fracture, anxiety, who was admitted to [facility] on 12/27/2015 for evaluation of worsening anxiety. Apparently the patient has lost 28 pounds within the past several weeks. He was reporting diminished appetite and poor by mouth intake. He was recently started on tramadol as well as trazodone at rehabilitation. He transitioned from the rehabilitation center back to home. The patient's wife states that he has been having some alterations in mental status at home. Upon routine evaluation the patient was found to have a low serum sodium of 118. He was started on 0.9 normal saline and serum sodium is currently 123. Urinalysis also revealed too numerous to count WBCs.  Patient was also noted to be on hydrochlorothiazide at home and this was stopped.  On 12/06/2015 serum sodium was 131.   Medications: Outpatient medications: Prescriptions Prior to Admission  Medication Sig Dispense Refill Last Dose  . acetaminophen (TYLENOL) 650 MG CR tablet Take 650 mg by mouth daily. Reported on 05/08/2015   12/26/2015 at 0800  . aspirin EC 81 MG tablet Take 81 mg by mouth every morning.   12/26/2015 at 0800  . atorvastatin (LIPITOR) 40 MG tablet Take 40 mg by mouth daily.   12/26/2015 at 0800  . cholecalciferol (VITAMIN D) 400 units TABS tablet Take 400 Units by mouth daily.   12/25/2015 at unknown  . docusate sodium (COLACE) 100 MG capsule Take 1 capsule (100 mg total) by mouth 2 (two) times daily. 10 capsule 0 12/26/2015 at 2100  . fluticasone (FLONASE) 50 MCG/ACT nasal  spray INHALE 2 SPRAYS INTO EACH NOSTRIL EVERY DAY 16 g 11 Past Week at Unknown time  . hydrochlorothiazide (HYDRODIURIL) 25 MG tablet TAKE 1 TABLET BY MOUTH EVERY DAY 30 tablet 6 12/26/2015 at 0800  . lisinopril (PRINIVIL,ZESTRIL) 20 MG tablet Take 1 tablet (20 mg total) by mouth daily. 30 tablet 6 12/26/2015 at 0800  . metoprolol (LOPRESSOR) 50 MG tablet TAKE 1/2 TABLET BY MOUTH TWICE DAILY 30 tablet 12 12/26/2015 at 2100  . naproxen sodium (ANAPROX) 220 MG tablet Take 1 tablet (220 mg total) by mouth 4 (four) times daily as needed. 20 tablet 0 prn at prn  . omeprazole (PRILOSEC) 20 MG capsule TAKE ONE CAPSULE BY MOUTH TWICE A DAY 60 capsule 12 12/25/2015 at 0800  . polyethylene glycol (MIRALAX / GLYCOLAX) packet Take 17 g by mouth daily as needed for mild constipation. 14 each 0 prn at prn  . QUEtiapine (SEROQUEL) 50 MG tablet Take 1 tablet (50 mg total) by mouth at bedtime. 90 tablet 1 12/26/2015 at 2100  . senna (SENOKOT) 8.6 MG TABS tablet Take 2 tablets by mouth at bedtime as needed for mild constipation.   12/26/2015 at 2100  . tamsulosin (FLOMAX) 0.4 MG  CAPS capsule TAKE ONE CAPSULE BY MOUTH EVERY EVENING 30 capsule 12 12/26/2015 at 2100  . traMADol (ULTRAM) 50 MG tablet Take 50-100 mg by mouth every 4 (four) hours as needed.   12/25/2015 at unknown  . traZODone (DESYREL) 50 MG tablet Take 50 mg by mouth at bedtime.   12/25/2015 at unknown    Current medications: Current Facility-Administered Medications  Medication Dose Route Frequency Provider Last Rate Last Dose  . acetaminophen (TYLENOL) tablet 650 mg  650 mg Oral Q6H PRN Milagros LollSrikar [name], MD       Or  . acetaminophen (TYLENOL) suppository 650 mg  650 mg Rectal Q6H PRN Srikar [name], MD      . albuterol (PROVENTIL) (2.5 MG/3ML) 0.083% nebulizer solution 2.5 mg  2.5 mg Nebulization Q2H PRN Srikar [name], MD      . aspirin EC tablet 81 mg  81 mg Oral q morning - 10a Milagros LollSrikar [name], MD   81 mg at 12/29/15 0906  . atorvastatin (LIPITOR)  tablet 40 mg  40 mg Oral q1800 Milagros LollSrikar [name], MD   40 mg at 12/28/15 1735  . bisacodyl (DULCOLAX) suppository 10 mg  10 mg Rectal Daily PRN Milagros LollSrikar [name], MD   10 mg at 12/27/15 1333  . cefTRIAXone (ROCEPHIN) IVPB 1 g  1 g Intravenous Q24H Milagros LollSrikar [name], MD   1 g at 12/29/15 0911  . docusate sodium (COLACE) capsule 100 mg  100 mg Oral BID Milagros LollSrikar [name], MD   100 mg at 12/29/15 0906  . enoxaparin (LOVENOX) injection 40 mg  40 mg Subcutaneous Q24H Milagros LollSrikar [name], MD   40 mg at 12/29/15 0909  . feeding supplement (ENSURE ENLIVE) (ENSURE ENLIVE) liquid 237 mL  237 mL Oral BID BM Srikar [name], MD   237 mL at 12/29/15 1000  . fluticasone (FLONASE) 50 MCG/ACT nasal spray 2 spray  2 spray Each Nare Daily Milagros LollSrikar [name], MD   2 spray at 12/29/15 0906  . ketorolac (TORADOL) 15 MG/ML injection 15 mg  15 mg Intravenous Q8H PRN Milagros LollSrikar [name], MD   15 mg at 12/28/15 2309  . lisinopril (PRINIVIL,ZESTRIL) tablet 20 mg  20 mg Oral Daily Milagros LollSrikar [name], MD   20 mg at 12/28/15 0909  . LORazepam (ATIVAN) tablet 0.5 mg  0.5 mg Oral Q12H PRN Milagros LollSrikar [name], MD   0.5 mg at 12/29/15 0903  . metoprolol tartrate (LOPRESSOR) tablet 25 mg  25 mg Oral BID Milagros LollSrikar [name], MD   25 mg at 12/29/15 [id number]  . mirtazapine (REMERON) tablet 15 mg  15 mg Oral QHS Audery AmelJohn T Clapacs, MD   15 mg at 12/28/15 2259  . naproxen (NAPROSYN) tablet 250 mg  250 mg Oral QID PRN Milagros LollSrikar [name], MD   250 mg at 12/27/15 1720  . ondansetron (ZOFRAN) tablet 4 mg  4 mg Oral Q6H PRN Milagros LollSrikar [name], MD   4 mg at 12/27/15 1720   Or  . ondansetron (ZOFRAN) injection 4 mg  4 mg Intravenous Q6H PRN Milagros LollSrikar [name], MD   4 mg at 12/29/15 0902  . pantoprazole (PROTONIX) EC tablet 40 mg  40 mg Oral BID Milagros LollSrikar [name], MD   40 mg at 12/29/15 0906  . polyethylene glycol (MIRALAX / GLYCOLAX) packet 17 g  17 g Oral Daily PRN Srikar [name], MD      . QUEtiapine (SEROQUEL) tablet 50 mg  50 mg Oral QHS Milagros LollSrikar [name], MD   50 mg at 12/28/15 2259  . tamsulosin (FLOMAX) capsule 0.4 mg   0.4 mg  Oral QPM Milagros LollSrikar [name], MD   0.4 mg at 12/28/15 1735      Allergies: Allergies  Allergen Reactions  . Solu-Medrol [Methylprednisolone Acetate] Anxiety      Past Medical History: Past Medical History:  Diagnosis Date  . Angina pectoris syndrome (HCC)   . 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 Arthritis   . GERD (gastroesophageal reflux disease)   . Grief at loss of child 09/03/2014  . Hypercholesteremia   . Hypertension   . Prostate enlargement      Past Surgical History: Past Surgical History:  Procedure Laterality Date  . TOTAL HIP ARTHROPLASTY Right 12/05/2015   Procedure: TOTAL HIP ARTHROPLASTY ANTERIOR APPROACH;  Surgeon: Kennedy BuckerMichael Menz, MD;  Location: [facility] ORS;  Service: Orthopedics;  Laterality: Right;     Family History: Family History  Problem Relation Age of Onset  . Hypertension Mother   . Heart block Mother   . Stroke Mother   . Depression Mother   . Cancer Father   . Heart attack Sister   . COPD Sister   . Hypertension Brother   . Hypertension Brother   . Hypertension Brother   . Hypertension Brother   . Heart attack Brother   . Diabetes Neg Hx      Social History: Social History   Social History  . Marital status: Married    Spouse name: N/A  . Number of children: N/A  . Years of education: N/A   Occupational History  . Not on file.   Social History Main Topics  . Smoking status: Never Smoker  . Smokeless tobacco: Never Used  . Alcohol use No  . Drug use: No  . Sexual activity: Not Currently   Other Topics Concern  . Not on file   Social History Narrative  . No narrative on file     Review of Systems: Review of Systems  Constitutional: Positive for weight loss. Negative for chills and fever.  HENT: Negative for hearing loss and tinnitus.   Eyes: Negative for blurred vision and double vision.  Respiratory: Negative for cough and hemoptysis.   Cardiovascular: Negative for chest pain, palpitations  and orthopnea.  Gastrointestinal: Negative for abdominal pain, heartburn, nausea and vomiting.  Genitourinary: Negative for dysuria, frequency and urgency.  Musculoskeletal: Positive for joint pain.  Skin: Positive for itching. Negative for rash.  Neurological: Negative for dizziness, focal weakness and headaches.  Endo/Heme/Allergies: Negative for polydipsia. Does not bruise/bleed easily.  Psychiatric/Behavioral: The patient is nervous/anxious.      Vital Signs: Blood pressure (!) 112/51, pulse 92, temperature 98 F (36.7 C), temperature source Oral, resp. rate 16, height 5\' 9"  (1.753 m), weight 111.6 kg (246 lb 1.6 oz), SpO2 98 %.  Weight trends: Filed Weights   12/27/15 0432 12/28/15 0500 12/29/15 0500  Weight: 102.1 kg (225 lb) 109.4 kg (241 lb 3.2 oz) 111.6 kg (246 lb 1.6 oz)    Physical Exam: General: NAD,   Head: Normocephalic, atraumatic.  Eyes: Anicteric, EOMI  Nose: Mucous membranes moist, not inflammed, nonerythematous.  Throat: Oropharynx nonerythematous, no exudate appreciated.   Neck: Supple, trachea midline.  Lungs:  Normal respiratory effort. Clear to auscultation BL without crackles or wheezes.  Heart: RRR. S1 and S2 normal without gallop, murmur, or rubs.  Abdomen:  BS normoactive. Soft, Nondistended, non-tender.  No masses or organomegaly.  Extremities: No pretibial edema.  Neurologic: A&O X3, Motor strength is 5/5 in the all 4 extremities  Skin: No visible rashes, scars.  Lab results: Basic Metabolic Panel:  Recent Labs Lab 12/27/15 0436  12/28/15 0610 12/28/15 1855 12/28/15 2327 12/29/15 0553  NA 118*  < > 124*  123* 118* 119* 123*  K 3.7  --  3.9  --   --   --   CL 87*  --  95*  --   --   --   CO2 23  --  23  --   --   --   GLUCOSE 106*  --  97  --   --   --   BUN 9  --  7  --   --   --   CREATININE 0.59*  --  0.60*  --   --   --   CALCIUM 9.1  --  8.3*  --   --   --   < > = values in this interval not displayed.  Liver Function  Tests:  Recent Labs Lab 12/27/15 0436  AST 27  ALT 25  ALKPHOS 123  BILITOT 0.9  PROT 6.6  ALBUMIN 4.0   No results for input(s): LIPASE, AMYLASE in the last 168 hours. No results for input(s): AMMONIA in the last 168 hours.  CBC:  Recent Labs Lab 12/27/15 0436 12/28/15 0610  WBC 7.6 7.1  HGB 11.4* 9.8*  HCT 31.8* 27.3*  MCV 90.7 91.0  PLT 479* 372    Cardiac Enzymes:  Recent Labs Lab 12/27/15 0436  TROPONINI <0.03    BNP: Invalid input(s): POCBNP  CBG: No results for input(s): GLUCAP in the last 168 hours.  Microbiology: Results for orders placed or performed during the hospital encounter of 12/04/15  MRSA PCR Screening     Status: None   Collection Time: 12/04/15  9:20 PM  Result Value Ref Range Status   MRSA by PCR NEGATIVE NEGATIVE Final    Comment:        The GeneXpert MRSA Assay (FDA approved for NASAL specimens only), is one component of a comprehensive MRSA colonization surveillance program. It is not intended to diagnose MRSA infection nor to guide or monitor treatment for MRSA infections.     Coagulation Studies: No results for input(s): LABPROT, INR in the last 72 hours.  Urinalysis:  Recent Labs  12/27/15 0753  COLORURINE YELLOW*  LABSPEC 1.013  PHURINE 6.0  GLUCOSEU NEGATIVE  HGBUR NEGATIVE  BILIRUBINUR NEGATIVE  KETONESUR TRACE*  PROTEINUR NEGATIVE  NITRITE NEGATIVE  LEUKOCYTESUR 2+*      Imaging:  No results found.   Assessment & Plan: Pt is a 81 y.o. male with a PMHx of Hypertension, recent right hip fracture, right humerus fracture, anxiety, who was admitted to [facility] on 12/27/2015 for evaluation of worsening anxiety.   1.  Hyponatremia, pt was on HCTZ as outpt, also has risk factors for SIADH including recent pain. 2.  Hypertension. 3.  Urinary tract infection.  Plan:  The patient had recent right hip fracture as well as right humeral fracture. He was subsequently discharged to rehabilitation and then  discharged to home. Patient presented now with a serum sodium of 118. Serum sodium has fluctuated over the course of this hospitalization. When he came in serum sodium was 118. He was hydrated and serum sodium did come up to 124. However late yesterday serum sodium dropped back down to 118 but is 123 this a.m. Suspect that there may be some element of SIADH. We will proceed with additional workup including serum osmolality, urine osmolality, uric acid, TSH.  Continue to hold  fluids for now. We may consider use of tolvaptan but we will hold off for now.

## 2015-12-29 NOTE — Progress Notes (Signed)
Physical Therapy Treatment Patient Details Name: Crista ElliotWalter D Pair MRN: [id number] DOB: June 17, 1934 Today's Date: 12/29/2015    History of Present Illness Pt is a 81 y/o M who presented to the emergency room due to anxiety and insomnia.  Discovered severe hyponatremia with workup.  Pt's PMH includes recent hip fx s/p R THA anterior approach, R humerus fx in sling, angina.      PT Comments    To edge of bed with rail and increased time.  [facility].  Unsteady at times but no lob's.  Pt remained out of bed in chair at end of session.  Follow Up Recommendations  Home health PT;Supervision for mobility/OOB     Equipment Recommendations  Other (comment) (hemiwalker if he does not have one already)    Recommendations for Other Services       Precautions / Restrictions Precautions Precautions: Fall Required Braces or Orthoses: Sling Restrictions Weight Bearing Restrictions: Yes RUE Weight Bearing: Non weight bearing RLE Weight Bearing: Weight bearing as tolerated    Mobility  Bed Mobility Overal bed mobility: Modified Independent Bed Mobility: Supine to Sit     Supine to sit: Min guard;HOB elevated (rail)        Transfers Overall transfer level: Needs assistance Equipment used: Hemi-walker Transfers: Sit to/from Stand Sit to Stand: Supervision         General transfer comment: Supervision for safety  Ambulation/Gait Ambulation/Gait assistance: Supervision Ambulation Distance (Feet): 300 Feet Assistive device: Hemi-walker Gait Pattern/deviations: Step-to pattern Gait velocity: decreased Gait velocity interpretation: Below normal speed for age/gender     Stairs            Wheelchair Mobility    Modified Rankin (Stroke Patients Only)       Balance Overall balance assessment: Needs assistance Sitting-balance support: Feet supported Sitting balance-Leahy Scale: Good     Standing balance  support: No upper extremity supported Standing balance-Leahy Scale: Fair                      Cognition Arousal/Alertness: Awake/alert Behavior During Therapy: WFL for tasks assessed/performed Overall Cognitive Status: Within Functional Limits for tasks assessed                      Exercises      General Comments        Pertinent Vitals/Pain Pain Assessment: 0-10 Pain Score: 2  Pain Location: back Pain Descriptors / Indicators: Aching Pain Intervention(s): Limited activity within patient's tolerance    Home Living                      Prior Function            PT Goals (current goals can now be found in the care plan section) Acute Rehab PT Goals Patient Stated Goal: to go home Progress towards PT goals: Progressing toward goals    Frequency    Min 2X/week      PT Plan Current plan remains appropriate    Co-evaluation             End of Session Equipment Utilized During Treatment: Gait belt Activity Tolerance: Patient tolerated treatment well Patient left: in chair;with call bell/phone within reach;with chair alarm set     Time: 1140-1156 PT Time Calculation (min) (ACUTE ONLY): 16 min  Charges:  $Gait Training: 8-22 mins  G Codes:      Danielle DessSarah [place] Luppino 12/29/2015, 12:03 PM

## 2015-12-30 LAB — SODIUM: Sodium: 129 mmol/L — ABNORMAL LOW (ref 135–145)

## 2015-12-30 MED ORDER — TAMSULOSIN HCL 0.4 MG PO CAPS
0.4000 mg | ORAL_CAPSULE | Freq: Every day | ORAL | Status: DC
  Administered 2015-12-30 – 2015-12-31 (×2): 0.4 mg via ORAL
  Filled 2015-12-30 (×2): qty 1

## 2015-12-30 NOTE — Progress Notes (Signed)
[facility]  ROUNDING NOTE   Subjective:  Serum sodium improved to 129. Urine output was good yesterday at 4 L. Serum uric acid level is low at 3.3 and serum osmolality was low. Urine osmolality was a bit lower than expected.   Objective:  Vital signs in last 24 hours:  Temp:  [97.9 F (36.6 C)-98.6 F (37 C)] 97.9 F (36.6 C) (10/29 0411) Pulse Rate:  [65-82] 82 (10/29 1100) Resp:  [16-19] 19 (10/29 0411) BP: (92-151)/(46-70) 127/54 (10/29 1100) SpO2:  [97 %-100 %] 97 % (10/29 0411) Weight:  [112 kg (247 lb)] 112 kg (247 lb) (10/29 0500)  Weight change: 0.408 kg (14.4 oz) Filed Weights   12/28/15 0500 12/29/15 0500 12/30/15 0500  Weight: 109.4 kg (241 lb 3.2 oz) 111.6 kg (246 lb 1.6 oz) 112 kg (247 lb)    Intake/Output: I/O last 3 completed shifts: In: 1936.3 [P.O.:360; I.V.:1576.3] Out: 5900 [Urine:5900]   Intake/Output this shift:  Total I/O In: 240 [P.O.:240] Out: 1000 [Urine:1000]  Physical Exam: General: No acute distress  Head: Normocephalic, atraumatic. Moist oral mucosal membranes  Eyes: Anicteric  Neck: Supple, trachea midline  Lungs:  Clear to auscultation, normal effort  Heart: S1S2 no rubs  Abdomen:  Soft, nontender,   Extremities:  peripheral edema.  Neurologic: Nonfocal, moving all four extremities  Skin: No lesions       Basic Metabolic Panel:  Recent Labs Lab 12/27/15 0436  12/28/15 0610 12/28/15 1855 12/28/15 2327 12/29/15 0553 12/29/15 1829 12/30/15 0610  NA 118*  < > 124*  123* 118* 119* 123* 123* 129*  K 3.7  --  3.9  --   --   --   --   --   CL 87*  --  95*  --   --   --   --   --   CO2 23  --  23  --   --   --   --   --   GLUCOSE 106*  --  97  --   --   --   --   --   BUN 9  --  7  --   --   --   --   --   CREATININE 0.59*  --  0.60*  --   --   --   --   --   CALCIUM 9.1  --  8.3*  --   --   --   --   --   < > = values in this interval not displayed.  Liver Function Tests:  Recent Labs Lab 12/27/15 0436   AST 27  ALT 25  ALKPHOS 123  BILITOT 0.9  PROT 6.6  ALBUMIN 4.0   No results for input(s): LIPASE, AMYLASE in the last 168 hours. No results for input(s): AMMONIA in the last 168 hours.  CBC:  Recent Labs Lab 12/27/15 0436 12/28/15 0610  WBC 7.6 7.1  HGB 11.4* 9.8*  HCT 31.8* 27.3*  MCV 90.7 91.0  PLT 479* 372    Cardiac Enzymes:  Recent Labs Lab 12/27/15 0436  TROPONINI <0.03    BNP: Invalid input(s): POCBNP  CBG: No results for input(s): GLUCAP in the last 168 hours.  Microbiology: Results for orders placed or performed during the hospital encounter of 12/04/15  MRSA PCR Screening     Status: None   Collection Time: 12/04/15  9:20 PM  Result Value Ref Range Status   MRSA by PCR NEGATIVE NEGATIVE Final  Comment:        The GeneXpert MRSA Assay (FDA approved for NASAL specimens only), is one component of a comprehensive MRSA colonization surveillance program. It is not intended to diagnose MRSA infection nor to guide or monitor treatment for MRSA infections.     Coagulation Studies: No results for input(s): LABPROT, INR in the last 72 hours.  Urinalysis: No results for input(s): COLORURINE, LABSPEC, PHURINE, GLUCOSEU, HGBUR, BILIRUBINUR, KETONESUR, PROTEINUR, UROBILINOGEN, NITRITE, LEUKOCYTESUR in the last 72 hours.  Invalid input(s): APPERANCEUR    Imaging: No results found.   Medications:     . aspirin EC  81 mg Oral q morning - 10a  . atorvastatin  40 mg Oral q1800  . docusate sodium  100 mg Oral BID  . enoxaparin (LOVENOX) injection  40 mg Subcutaneous Q24H  . feeding supplement (ENSURE ENLIVE)  237 mL Oral BID BM  . fluticasone  2 spray Each Nare Daily  . lisinopril  20 mg Oral Daily  . LORazepam  1 mg Oral QHS  . metoprolol  25 mg Oral BID  . mirtazapine  15 mg Oral QHS  . pantoprazole  40 mg Oral BID  . QUEtiapine  50 mg Oral QHS  . tamsulosin  0.4 mg Oral Daily   acetaminophen **OR** acetaminophen, albuterol, bisacodyl,  LORazepam, naproxen, ondansetron **OR** ondansetron (ZOFRAN) IV, polyethylene glycol  Assessment/ Plan:  81 y.o. male with a PMHx of Hypertension, recent right hip fracture, right humerus fracture, anxiety, who was admitted to [facility]27/2015 for evaluation of worsening anxiety.   1.  Hyponatremia, pt was on HCTZ as outpt, also has risk factors for SIADH including recent pain. 2.  Hypertension. 3.  Urinary tract infection.  Plan:  Serum sodium appears to be improving. Serum osmolality and uric acid were low. Urine osmolality was not as low as expected however patient was actively diuresing yesterday. However patient appears to be behaving as SIADH. At this point in time recommend fluid restriction of 1200 mL per day. This was explained in depth to the patient. He verbalizes understanding. Would recommend outpatient follow-up at least once for the underlying hyponatremia.  LOS: 3 Bryton Romagnoli 10/29/201712:59 PM

## 2015-12-30 NOTE — Progress Notes (Signed)
[employer] [facility] - [place] [facility]   PATIENT NAME: Erik Palmer    MR#:  [id number]  DATE OF BIRTH:  1934/05/18  SUBJECTIVE:  CHIEF COMPLAINT:   Chief Complaint  Patient presents with  . Anxiety   Patient has nocturia. Once his Flomax change to the morning. Feels better overall. Wife at bedside.  REVIEW OF SYSTEMS:    Review of Systems  Constitutional: Positive for malaise/fatigue and weight loss. Negative for chills and fever.  HENT: Negative for sore throat.   Eyes: Negative for blurred vision, double vision and pain.  Respiratory: Negative for cough, hemoptysis, shortness of breath and wheezing.   Cardiovascular: Negative for chest pain, palpitations, orthopnea and leg swelling.  Gastrointestinal: Negative for abdominal pain, constipation, diarrhea, heartburn, nausea and vomiting.  Genitourinary: Negative for dysuria and hematuria.  Musculoskeletal: Positive for back pain and joint pain.  Skin: Negative for rash.  Neurological: Positive for weakness. Negative for sensory change, speech change, focal weakness and headaches.  Endo/Heme/Allergies: Does not bruise/bleed easily.  Psychiatric/Behavioral: Positive for depression. The patient is nervous/anxious.     DRUG ALLERGIES:   Allergies  Allergen Reactions  . Solu-Medrol [Methylprednisolone Acetate] Anxiety    VITALS:  Blood pressure (!) 151/70, pulse 81, temperature 97.9 F (36.6 C), temperature source Oral, resp. rate 19, height 5\' 9"  (1.753 m), weight 112 kg (247 lb), SpO2 97 %.  PHYSICAL EXAMINATION:   Physical Exam  GENERAL:  81 y.o.-year-old patient lying in the bed with no acute distress.  EYES: Pupils equal, round, reactive to light and accommodation. No scleral icterus. Extraocular muscles intact.  HEENT: Head atraumatic, normocephalic. Oropharynx and nasopharynx clear.  NECK:  Supple, no jugular venous distention. No thyroid enlargement, no tenderness.  LUNGS: Normal breath sounds  bilaterally, no wheezing, rales, rhonchi. No use of accessory muscles of respiration.  CARDIOVASCULAR: S1, S2 normal. No murmurs, rubs, or gallops.  ABDOMEN: Soft, nontender, nondistended. Bowel sounds present. No organomegaly or mass.  EXTREMITIES: No cyanosis, clubbing or edema b/l.    NEUROLOGIC: Cranial nerves II through XII are intact. No focal Motor or sensory deficits b/l.   PSYCHIATRIC: The patient is alert and oriented x 3.  SKIN: No obvious rash, lesion, or ulcer.   Right arm sling  LABORATORY PANEL:   CBC  Recent Labs Lab 12/28/15 0610  WBC 7.1  HGB 9.8*  HCT 27.3*  PLT 372   ------------------------------------------------------------------------------------------------------------------ Chemistries   Recent Labs Lab 12/27/15 0436  12/28/15 0610  12/30/15 0610  NA 118*  < > 124*  123*  < > 129*  K 3.7  --  3.9  --   --   CL 87*  --  95*  --   --   CO2 23  --  23  --   --   GLUCOSE 106*  --  97  --   --   BUN 9  --  7  --   --   CREATININE 0.59*  --  0.60*  --   --   CALCIUM 9.1  --  8.3*  --   --   AST 27  --   --   --   --   ALT 25  --   --   --   --   ALKPHOS 123  --   --   --   --   BILITOT 0.9  --   --   --   --   < > = values in this  interval not displayed. ------------------------------------------------------------------------------------------------------------------  Cardiac Enzymes  Recent Labs Lab 12/27/15 0436  TROPONINI <0.03   ------------------------------------------------------------------------------------------------------------------  RADIOLOGY:  No results found.   ASSESSMENT AND PLAN:   * Severe hyponatremia -Improving Likely dehydration and decreased solute load. Hydrochlorothiazide stopped Stopped normal saline Encouraged to eat better. Monitor I/Os and daily weight Serial Na. Appreciate nephrology input.  Repeat labs in the morning. Sodium 1:30 or higher can discharge.  * Depression/anxiety Seen by  psychiatry. Appreciate input. Started on Remeron.  * UTI On ceftriaxone.  Cultures not sent from the emergency room. Afebrile with normal WBC. We'll stop IV antibiotics today after his third dose.  * HTN Continue metoprolol and Lisinopril Hold HCTZ  * Recent hip fracture Resolved  * Insomnia This has been a recurrent issue when patient is in the hospital or nursing home as per family. Added scheduled Ativan at night only during hospital stay.  * DVT prophylaxis Lovenox  All the records are reviewed and case discussed with Care Management/Social Workerr. Management plans discussed with the patient, family and they are in agreement.  CODE STATUS: FULL CODE  DVT Prophylaxis: SCDs  TOTAL TIME TAKING CARE OF THIS PATIENT: 35 minutes.   POSSIBLE D/C IN 1-2 DAYS, DEPENDING ON CLINICAL CONDITION.  Milagros LollSudini, Aaryn Sermon R M.D on 12/30/2015 at 10:21 AM  Between 7am to 6pm - Pager - [phone]  After 6pm go to [email or web address] - password [email or web address] [facility]  [facility]  Office  [phone]  CC: Primary care physician; Fidel LevyJames Hawkins Jr, MD  Note: This dictation was prepared with Dragon dictation along with smaller phrase technology. Any transcriptional errors that result from this process are unintentional.

## 2015-12-30 NOTE — Care Management Important Message (Signed)
Important Message  Patient Details  Name: Erik Palmer MRN: [id number] Date of Birth: 1934-03-10   Medicare Important Message Given:  Yes    Gayle Martinez A, RN 12/30/2015, 2:54 PM

## 2015-12-31 ENCOUNTER — Other Ambulatory Visit: Payer: Self-pay | Admitting: Family Medicine

## 2015-12-31 DIAGNOSIS — I10 Essential (primary) hypertension: Secondary | ICD-10-CM

## 2015-12-31 LAB — CBC
HCT: 28.8 % — ABNORMAL LOW (ref 40.0–52.0)
Hemoglobin: 10.1 g/dL — ABNORMAL LOW (ref 13.0–18.0)
MCH: 32.4 pg (ref 26.0–34.0)
MCHC: 35 g/dL (ref 32.0–36.0)
MCV: 92.5 fL (ref 80.0–100.0)
PLATELETS: 317 10*3/uL (ref 150–440)
RBC: 3.11 MIL/uL — ABNORMAL LOW (ref 4.40–5.90)
RDW: 13.5 % (ref 11.5–14.5)
WBC: 5.8 10*3/uL (ref 3.8–10.6)

## 2015-12-31 LAB — BASIC METABOLIC PANEL
Anion gap: 5 (ref 5–15)
BUN: 12 mg/dL (ref 6–20)
CALCIUM: 8.8 mg/dL — AB (ref 8.9–10.3)
CO2: 26 mmol/L (ref 22–32)
Chloride: 99 mmol/L — ABNORMAL LOW (ref 101–111)
Creatinine, Ser: 0.74 mg/dL (ref 0.61–1.24)
GFR calc Af Amer: >60 mL/min (ref >60)
GLUCOSE: 94 mg/dL (ref 65–99)
Potassium: 4 mmol/L (ref 3.5–5.1)
SODIUM: 130 mmol/L — AB (ref 135–145)

## 2015-12-31 MED ORDER — MIRTAZAPINE 15 MG PO TABS: 15.0000 mg | ORAL_TABLET | Freq: Every day | ORAL | 0 refills | Status: DC

## 2015-12-31 NOTE — Care Management (Signed)
Discharge to home today per Dr. Ok AnisSusini. Home Health, face-to-face, face sheet and history & physical faced to [facility]. Family will transport Gwenette GreetBrenda S Fujiko Picazo RN MSN [facility] Care Management [phone]

## 2015-12-31 NOTE — Progress Notes (Signed)
Received MD order to discharge patient to home, reviewed home meds, discharge instructions follow up appointment and prescriptions with patient and wife and both verbalized understanding, discharged to home in wheelchair with auxillary staff

## 2015-12-31 NOTE — Consult Note (Signed)
   [facility]   12/31/2015  Erik MurphyWalter D Palmer [birth year] [id number]   Patient screened for potential [facility] Care Management services. Patient was eligible for [facility] Services. Went by patient's room to see patient and speak with him about his eligibility but he had already discharged to home.  For questions please contact:   Isay Perleberg RN, BSN [facility]  Hospital Liaison  [phone]) Business Mobile [phone]) Toll free office

## 2016-01-01 NOTE — Discharge Summary (Signed)
[employer] [facility] - [place] [facility]   PATIENT NAME: Erik Palmer    MR#:  [id number]  DATE OF BIRTH:  Sep 06, 1934  DATE OF ADMISSION:  12/27/2015 ADMITTING PHYSICIAN: Milagros LollSrikar Zanobia Griebel, MD  DATE OF DISCHARGE: 12/31/2015  2:30 PM  PRIMARY CARE PHYSICIAN: Fidel LevyJames Hawkins Jr, MD   ADMISSION DIAGNOSIS:  Hyponatremia [E87.1]  DISCHARGE DIAGNOSIS:  Active Problems:   Hyponatremia   Anxiety disorder due to general medical condition   Insomnia   Malnutrition of moderate degree   SECONDARY DIAGNOSIS:   Past Medical History:  Diagnosis Date  . Angina pectoris syndrome (HCC)   . 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 Arthritis   . GERD (gastroesophageal reflux disease)   . Grief at loss of child 09/03/2014  . Hypercholesteremia   . Hypertension   . Prostate enlargement      ADMITTING HISTORY  Erik RaoWalter Cochrane  is a 81 y.o. male with a known history of Hypertension, recent hip fracture, right humerus fracture, anxiety presents to the emergency room after noticing more anxiety and insomnia. He has had 28 pound weight loss last few weeks. He has had decreased appetite and poor oral intake. Dark urine. Some dysuria. No vomiting. Was started on tramadol and trazodone in the rehabilitation. Today in the emergency room patient was found to have sodium of 118 and is being admitted to the hospitalist service. His last known sodium was 131. Urinalysis showed too numerous to count white blood cells with rare bacteria.  HOSPITAL COURSE:   * Severe hyponatremia -Resolved This is multifactorial due to being on hydrochlorothiazide, dehydrated and some competent of SIADH. Approved with IV fluids. Later this was stopped and patient placed on a fluid restriction of 1200 ML. With this sodium improved from 118 to 130. He will follow-up with Dr. Cherylann RatelLateef of nephrology in 1 week in his office. Hydrochlorothiazide stopped at discharge.  * Depression/anxiety Seen by  psychiatry. Appreciate input. Started on Remeron. Continue Seroquel  * UTI On ceftriaxone.  Had 4 days of IV antibiotics in the hospital. Stop.  * HTN Continue metoprolol and Lisinopril Hold HCTZ  * Recent hip fracture Resolved  * Insomnia Improving with Remeron  * DVT prophylaxis Lovenox  Patient discharged home in stable condition with fluid restriction and home health physical therapy.  CONSULTS OBTAINED:  Treatment Team:  Audery AmelJohn T Clapacs, MD  DRUG ALLERGIES:   Allergies  Allergen Reactions  . Solu-Medrol [Methylprednisolone Acetate] Anxiety    DISCHARGE MEDICATIONS:   Discharge Medication List as of 12/31/2015  1:56 PM    START taking these medications   Details  mirtazapine (REMERON) 15 MG tablet Take 1 tablet (15 mg total) by mouth at bedtime., Starting Mon 12/31/2015, Normal      CONTINUE these medications which have NOT CHANGED   Details  acetaminophen (TYLENOL) 650 MG CR tablet Take 650 mg by mouth daily. Reported on 05/08/2015, Historical Med    aspirin EC 81 MG tablet Take 81 mg by mouth every morning., Historical Med    atorvastatin (LIPITOR) 40 MG tablet Take 40 mg by mouth daily., Historical Med    cholecalciferol (VITAMIN D) 400 units TABS tablet Take 400 Units by mouth daily., Historical Med    docusate sodium (COLACE) 100 MG capsule Take 1 capsule (100 mg total) by mouth 2 (two) times daily., Starting Fri 12/07/2015, Normal    fluticasone (FLONASE) 50 MCG/ACT nasal spray INHALE 2 SPRAYS INTO EACH NOSTRIL EVERY DAY, Normal  metoprolol (LOPRESSOR) 50 MG tablet TAKE 1/2 TABLET BY MOUTH TWICE DAILY, Normal    naproxen sodium (ANAPROX) 220 MG tablet Take 1 tablet (220 mg total) by mouth 4 (four) times daily as needed., Starting Fri 12/07/2015, Normal    omeprazole (PRILOSEC) 20 MG capsule TAKE ONE CAPSULE BY MOUTH TWICE A DAY, Normal    polyethylene glycol (MIRALAX / GLYCOLAX) packet Take 17 g by mouth daily as needed for mild constipation.,  Starting Fri 12/07/2015, Normal    QUEtiapine (SEROQUEL) 50 MG tablet Take 1 tablet (50 mg total) by mouth at bedtime., Starting Thu 09/13/2015, Normal    senna (SENOKOT) 8.6 MG TABS tablet Take 2 tablets by mouth at bedtime as needed for mild constipation., Historical Med    tamsulosin (FLOMAX) 0.4 MG CAPS capsule TAKE ONE CAPSULE BY MOUTH EVERY EVENING, Normal    traMADol (ULTRAM) 50 MG tablet Take 50-100 mg by mouth every 4 (four) hours as needed., Historical Med    lisinopril (PRINIVIL,ZESTRIL) 20 MG tablet Take 1 tablet (20 mg total) by mouth daily., Starting Tue 02/13/2015, Normal      STOP taking these medications     hydrochlorothiazide (HYDRODIURIL) 25 MG tablet      traZODone (DESYREL) 50 MG tablet         Today   VITAL SIGNS:  Blood pressure 136/64, pulse 72, temperature 98.9 F (37.2 C), temperature source Oral, resp. rate (!) 23, height 5\' 9"  (1.753 m), weight 111.2 kg (245 lb 3.2 oz), SpO2 98 %.  I/O:   Intake/Output Summary (Last 24 hours) at 01/01/16 1336 Last data filed at 12/31/15 1403  Gross per 24 hour  Intake              240 ml  Output                0 ml  Net              240 ml    PHYSICAL EXAMINATION:  Physical Exam  GENERAL:  81 y.o.-year-old patient lying in the bed with no acute distress.  LUNGS: Normal breath sounds bilaterally, no wheezing, rales,rhonchi or crepitation. No use of accessory muscles of respiration.  CARDIOVASCULAR: S1, S2 normal. No murmurs, rubs, or gallops.  ABDOMEN: Soft, non-tender, non-distended. Bowel sounds present. No organomegaly or mass.  NEUROLOGIC: Moves all 4 extremities. PSYCHIATRIC: The patient is alert and oriented x 3.  SKIN: No obvious rash, lesion, or ulcer.   DATA REVIEW:   CBC  Recent Labs Lab 12/31/15 0439  WBC 5.8  HGB 10.1*  HCT 28.8*  PLT 317    Chemistries   Recent Labs Lab 12/27/15 0436  12/31/15 0439  NA 118*  < > 130*  K 3.7  < > 4.0  CL 87*  < > 99*  CO2 23  < > 26  GLUCOSE  106*  < > 94  BUN 9  < > 12  CREATININE 0.59*  < > 0.74  CALCIUM 9.1  < > 8.8*  AST 27  --   --   ALT 25  --   --   ALKPHOS 123  --   --   BILITOT 0.9  --   --   < > = values in this interval not displayed.  Cardiac Enzymes  Recent Labs Lab 12/27/15 0436  TROPONINI <0.03    Microbiology Results  Results for orders placed or performed during the hospital encounter of 12/04/15  MRSA PCR Screening  Status: None   Collection Time: 12/04/15  9:20 PM  Result Value Ref Range Status   MRSA by PCR NEGATIVE NEGATIVE Final    Comment:        The GeneXpert MRSA Assay (FDA approved for NASAL specimens only), is one component of a comprehensive MRSA colonization surveillance program. It is not intended to diagnose MRSA infection nor to guide or monitor treatment for MRSA infections.     RADIOLOGY:  No results found.  Follow up with PCP in 1 week.  Management plans discussed with the patient, family and they are in agreement.  CODE STATUS:  Code Status History    Date Active Date Inactive Code Status Order ID Comments User Context   12/27/2015  7:49 AM 12/31/2015  6:27 PM Full Code [id number]  [name], MD ED   12/04/2015  5:48 PM 12/07/2015  8:15 PM Full Code [id number]  [name], MD ED   09/02/2014  9:36 PM 09/04/2014 12:58 PM Full Code [id number]  Wyatt Haste, MD ED      TOTAL TIME TAKING CARE OF THIS PATIENT ON DAY OF DISCHARGE: more than 30 minutes.   [name] R M.D on 01/01/2016 at 1:36 PM  Between 7am to 6pm - Pager - [phone]  After 6pm go to [email or web address] - password [email or web address] [facility]  [facility] [place] [facility]  Office  [phone]  CC: Primary care physician; Fidel Levy, MD  Note: This dictation was prepared with Dragon dictation along with smaller phrase technology. Any transcriptional errors that result from this process are unintentional.

## 2016-01-02 DIAGNOSIS — S42301D Unspecified fracture of shaft of humerus, right arm, subsequent encounter for fracture with routine healing: Secondary | ICD-10-CM | POA: Diagnosis not present

## 2016-01-02 DIAGNOSIS — Z7982 Long term (current) use of aspirin: Secondary | ICD-10-CM | POA: Diagnosis not present

## 2016-01-02 DIAGNOSIS — S72001D Fracture of unspecified part of neck of right femur, subsequent encounter for closed fracture with routine healing: Secondary | ICD-10-CM | POA: Diagnosis not present

## 2016-01-02 DIAGNOSIS — W19XXXD Unspecified fall, subsequent encounter: Secondary | ICD-10-CM | POA: Diagnosis not present

## 2016-01-02 DIAGNOSIS — Z9181 History of falling: Secondary | ICD-10-CM | POA: Diagnosis not present

## 2016-01-02 DIAGNOSIS — I10 Essential (primary) hypertension: Secondary | ICD-10-CM | POA: Diagnosis not present

## 2016-01-02 DIAGNOSIS — Z96641 Presence of right artificial hip joint: Secondary | ICD-10-CM | POA: Diagnosis not present

## 2016-01-02 DIAGNOSIS — Z79891 Long term (current) use of opiate analgesic: Secondary | ICD-10-CM | POA: Diagnosis not present

## 2016-01-02 DIAGNOSIS — I209 Angina pectoris, unspecified: Secondary | ICD-10-CM | POA: Diagnosis not present

## 2016-01-04 DIAGNOSIS — S72001D Fracture of unspecified part of neck of right femur, subsequent encounter for closed fracture with routine healing: Secondary | ICD-10-CM | POA: Diagnosis not present

## 2016-01-04 DIAGNOSIS — I209 Angina pectoris, unspecified: Secondary | ICD-10-CM | POA: Diagnosis not present

## 2016-01-04 DIAGNOSIS — Z9181 History of falling: Secondary | ICD-10-CM | POA: Diagnosis not present

## 2016-01-04 DIAGNOSIS — S42301D Unspecified fracture of shaft of humerus, right arm, subsequent encounter for fracture with routine healing: Secondary | ICD-10-CM | POA: Diagnosis not present

## 2016-01-04 DIAGNOSIS — I10 Essential (primary) hypertension: Secondary | ICD-10-CM | POA: Diagnosis not present

## 2016-01-04 DIAGNOSIS — Z7982 Long term (current) use of aspirin: Secondary | ICD-10-CM | POA: Diagnosis not present

## 2016-01-04 DIAGNOSIS — Z79891 Long term (current) use of opiate analgesic: Secondary | ICD-10-CM | POA: Diagnosis not present

## 2016-01-04 DIAGNOSIS — W19XXXD Unspecified fall, subsequent encounter: Secondary | ICD-10-CM | POA: Diagnosis not present

## 2016-01-04 DIAGNOSIS — Z96641 Presence of right artificial hip joint: Secondary | ICD-10-CM | POA: Diagnosis not present

## 2016-01-08 ENCOUNTER — Encounter: Payer: Self-pay | Admitting: Family Medicine

## 2016-01-08 ENCOUNTER — Ambulatory Visit (INDEPENDENT_AMBULATORY_CARE_PROVIDER_SITE_OTHER): Payer: Medicare Other | Admitting: Family Medicine

## 2016-01-08 VITALS — BP 164/78 | HR 77 | Temp 98.2°F | Resp 16 | Ht 69.0 in | Wt 230.0 lb

## 2016-01-08 DIAGNOSIS — W19XXXD Unspecified fall, subsequent encounter: Secondary | ICD-10-CM | POA: Diagnosis not present

## 2016-01-08 DIAGNOSIS — Z9181 History of falling: Secondary | ICD-10-CM | POA: Diagnosis not present

## 2016-01-08 DIAGNOSIS — I10 Essential (primary) hypertension: Secondary | ICD-10-CM | POA: Diagnosis not present

## 2016-01-08 DIAGNOSIS — E785 Hyperlipidemia, unspecified: Secondary | ICD-10-CM

## 2016-01-08 DIAGNOSIS — S72001D Fracture of unspecified part of neck of right femur, subsequent encounter for closed fracture with routine healing: Secondary | ICD-10-CM | POA: Diagnosis not present

## 2016-01-08 DIAGNOSIS — Z96641 Presence of right artificial hip joint: Secondary | ICD-10-CM | POA: Diagnosis not present

## 2016-01-08 DIAGNOSIS — E871 Hypo-osmolality and hyponatremia: Secondary | ICD-10-CM | POA: Diagnosis not present

## 2016-01-08 DIAGNOSIS — S72001A Fracture of unspecified part of neck of right femur, initial encounter for closed fracture: Secondary | ICD-10-CM | POA: Diagnosis not present

## 2016-01-08 DIAGNOSIS — E222 Syndrome of inappropriate secretion of antidiuretic hormone: Secondary | ICD-10-CM

## 2016-01-08 DIAGNOSIS — Z7982 Long term (current) use of aspirin: Secondary | ICD-10-CM | POA: Diagnosis not present

## 2016-01-08 DIAGNOSIS — I209 Angina pectoris, unspecified: Secondary | ICD-10-CM | POA: Diagnosis not present

## 2016-01-08 DIAGNOSIS — F064 Anxiety disorder due to known physiological condition: Secondary | ICD-10-CM | POA: Diagnosis not present

## 2016-01-08 DIAGNOSIS — S42301D Unspecified fracture of shaft of humerus, right arm, subsequent encounter for fracture with routine healing: Secondary | ICD-10-CM | POA: Diagnosis not present

## 2016-01-08 DIAGNOSIS — F5104 Psychophysiologic insomnia: Secondary | ICD-10-CM

## 2016-01-08 DIAGNOSIS — Z79891 Long term (current) use of opiate analgesic: Secondary | ICD-10-CM | POA: Diagnosis not present

## 2016-01-08 LAB — BASIC METABOLIC PANEL WITH GFR
BUN: 10 mg/dL (ref 7–25)
CALCIUM: 8.9 mg/dL (ref 8.6–10.3)
CO2: 24 mmol/L (ref 20–31)
CREATININE: 0.64 mg/dL — AB (ref 0.70–1.18)
Chloride: 102 mmol/L (ref 98–110)
GFR, Est Non African American: >89 mL/min (ref >=60)
GLUCOSE: 100 mg/dL — AB (ref 65–99)
POTASSIUM: 4.2 mmol/L (ref 3.5–5.3)
Sodium: 136 mmol/L (ref 135–146)

## 2016-01-08 NOTE — Progress Notes (Signed)
Name: Erik Palmer   MRN: [id number]    DOB: 05-24-1934   Date:01/08/2016       Progress Note  Subjective  Chief Complaint  Chief Complaint  Patient presents with  . Hospitalization Follow-up    SIADH (hyponatremia)    HPI Here for hospital f/u.  He has SIADH with extremely low sodium.  He has been off diuretics now and sees Dr. Cherylann Ratel. Nephrology next seek..  Dr. Netty Starring fixed fractured  R hip.  He fractured R arm also.  No problem-specific Assessment & Plan notes found for this encounter.   Past Medical History:  Diagnosis Date  . Angina pectoris syndrome (HCC)   . 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 Arthritis   . GERD (gastroesophageal reflux disease)   . Grief at loss of child 09/03/2014  . Hypercholesteremia   . Hypertension   . Prostate enlargement     Past Surgical History:  Procedure Laterality Date  . TOTAL HIP ARTHROPLASTY Right 12/05/2015   Procedure: TOTAL HIP ARTHROPLASTY ANTERIOR APPROACH;  Surgeon: Kennedy Bucker, MD;  Location: [facility] ORS;  Service: Orthopedics;  Laterality: Right;    Family History  Problem Relation Age of Onset  . Hypertension Mother   . Heart block Mother   . Stroke Mother   . Depression Mother   . Cancer Father   . Heart attack Sister   . COPD Sister   . Hypertension Brother   . Hypertension Brother   . Hypertension Brother   . Hypertension Brother   . Heart attack Brother   . Diabetes Neg Hx     Social History   Social History  . Marital status: Married    Spouse name: N/A  . Number of children: N/A  . Years of education: N/A   Occupational History  . Not on file.   Social History Main Topics  . Smoking status: Never Smoker  . Smokeless tobacco: Never Used  . Alcohol use No  . Drug use: No  . Sexual activity: Not Currently   Other Topics Concern  . Not on file   Social History Narrative  . No narrative on file     Current Outpatient Prescriptions:  .  acetaminophen (TYLENOL) 650 MG  CR tablet, Take 650 mg by mouth daily. Reported on 05/08/2015, Disp: , Rfl:  .  aspirin EC 81 MG tablet, Take 81 mg by mouth every morning., Disp: , Rfl:  .  atorvastatin (LIPITOR) 40 MG tablet, Take 40 mg by mouth daily., Disp: , Rfl:  .  cholecalciferol (VITAMIN D) 400 units TABS tablet, Take 400 Units by mouth daily., Disp: , Rfl:  .  docusate sodium (COLACE) 100 MG capsule, Take 1 capsule (100 mg total) by mouth 2 (two) times daily., Disp: 10 capsule, Rfl: 0 .  fluticasone (FLONASE) 50 MCG/ACT nasal spray, INHALE 2 SPRAYS INTO EACH NOSTRIL EVERY DAY, Disp: 16 g, Rfl: 11 .  lisinopril (PRINIVIL,ZESTRIL) 20 MG tablet, TAKE 1 TABLET (20 MG TOTAL) BY MOUTH DAILY., Disp: 30 tablet, Rfl: 6 .  metoprolol (LOPRESSOR) 50 MG tablet, TAKE 1/2 TABLET BY MOUTH TWICE DAILY, Disp: 30 tablet, Rfl: 12 .  naproxen sodium (ANAPROX) 220 MG tablet, Take 1 tablet (220 mg total) by mouth 4 (four) times daily as needed., Disp: 20 tablet, Rfl: 0 .  omeprazole (PRILOSEC) 20 MG capsule, TAKE ONE CAPSULE BY MOUTH TWICE A DAY, Disp: 60 capsule, Rfl: 12 .  polyethylene glycol (MIRALAX / GLYCOLAX) packet, Take 17  g by mouth daily as needed for mild constipation., Disp: 14 each, Rfl: 0 .  QUEtiapine (SEROQUEL) 50 MG tablet, Take 1 tablet (50 mg total) by mouth at bedtime., Disp: 90 tablet, Rfl: 1 .  tamsulosin (FLOMAX) 0.4 MG CAPS capsule, TAKE ONE CAPSULE BY MOUTH EVERY EVENING (Patient taking differently: TAKE ONE CAPSULE BY MOUTH EVERY MORNING), Disp: 30 capsule, Rfl: 12  Allergies  Allergen Reactions  . Solu-Medrol [Methylprednisolone Acetate] Anxiety     Review of Systems  Constitutional: Negative for chills, fever, malaise/fatigue and weight loss.  HENT: Negative for hearing loss.   Eyes: Negative for blurred vision and double vision.  Respiratory: Negative for cough, shortness of breath and wheezing.   Cardiovascular: Positive for leg swelling (every night). Negative for chest pain, palpitations and PND.   Gastrointestinal: Negative for abdominal pain, blood in stool, diarrhea, heartburn and nausea.  Genitourinary: Negative for dysuria, frequency and urgency.  Musculoskeletal: Negative for joint pain and myalgias.  Skin: Negative for rash.  Neurological: Negative for dizziness, tingling, tremors, weakness and headaches.      Objective  Vitals:   01/08/16 0856  BP: (!) 164/78  Pulse: 77  Resp: 16  Temp: 98.2 F (36.8 C)  TempSrc: Oral  Weight: 104.3 kg (230 lb)  Height: '5\' 9"'$  (1.753 m)    Physical Exam  Constitutional: He is oriented to person, place, and time and well-developed, well-nourished, and in no distress. No distress.  HENT:  Head: Normocephalic and atraumatic.  Eyes: Conjunctivae and EOM are normal. Pupils are equal, round, and reactive to light. No scleral icterus.  Neck: Normal range of motion. Neck supple. Carotid bruit is not present. No thyromegaly present.  Cardiovascular: Normal rate and regular rhythm.  Exam reveals no gallop and no friction rub.   No murmur heard. Pulmonary/Chest: Effort normal and breath sounds normal. No respiratory distress. He has no wheezes. He has no rales.  Abdominal: Soft. Bowel sounds are normal. He exhibits no distension, no abdominal bruit and no mass. There is no tenderness.  Musculoskeletal: He exhibits no edema.  Lymphadenopathy:    He has no cervical adenopathy.  Neurological: He is alert and oriented to person, place, and time.  Vitals reviewed.      Recent Results (from the past 2160 hour(s))  Comprehensive metabolic panel     Status: Abnormal   Collection Time: 12/04/15  3:30 PM  Result Value Ref Range   Sodium 135 135 - 145 mmol/L   Potassium 3.2 (L) 3.5 - 5.1 mmol/L   Chloride 101 101 - 111 mmol/L   CO2 27 22 - 32 mmol/L   Glucose, Bld 125 (H) 65 - 99 mg/dL   BUN 14 6 - 20 mg/dL   Creatinine, Ser 0.69 0.61 - 1.24 mg/dL   Calcium 9.3 8.9 - 10.3 mg/dL   Total Protein 7.1 6.5 - 8.1 g/dL   Albumin 4.4 3.5 -  5.0 g/dL   AST 37 15 - 41 U/L   ALT 28 17 - 63 U/L   Alkaline Phosphatase 71 38 - 126 U/L   Total Bilirubin 0.9 0.3 - 1.2 mg/dL   GFR calc non Af Amer >60 >60 mL/min   GFR calc Af Amer >60 >60 mL/min    Comment: (NOTE) The eGFR has been calculated using the CKD EPI equation. This calculation has not been validated in all clinical situations. eGFR's persistently <60 mL/min signify possible Chronic Kidney Disease.    Anion gap 7 5 - 15  CBC with  Differential     Status: Abnormal   Collection Time: 12/04/15  3:30 PM  Result Value Ref Range   WBC 18.5 (H) 3.8 - 10.6 K/uL   RBC 4.30 (L) 4.40 - 5.90 MIL/uL   Hemoglobin 13.8 13.0 - 18.0 g/dL   HCT 40.4 40.0 - 52.0 %   MCV 93.8 80.0 - 100.0 fL   MCH 32.0 26.0 - 34.0 pg   MCHC 34.2 32.0 - 36.0 g/dL   RDW 13.1 11.5 - 14.5 %   Platelets 267 150 - 440 K/uL   Neutrophils Relative % 93 %   Neutro Abs 17.1 (H) 1.4 - 6.5 K/uL   Lymphocytes Relative 4 %   Lymphs Abs 0.8 (L) 1.0 - 3.6 K/uL   Monocytes Relative 3 %   Monocytes Absolute 0.6 0.2 - 1.0 K/uL   Eosinophils Relative 0 %   Eosinophils Absolute 0.0 0 - 0.7 K/uL   Basophils Relative 0 %   Basophils Absolute 0.0 0 - 0.1 K/uL  Protime-INR     Status: None   Collection Time: 12/04/15  3:30 PM  Result Value Ref Range   Prothrombin Time 14.7 11.4 - 15.2 seconds   INR 1.14   MRSA PCR Screening     Status: None   Collection Time: 12/04/15  9:20 PM  Result Value Ref Range   MRSA by PCR NEGATIVE NEGATIVE    Comment:        The GeneXpert MRSA Assay (FDA approved for NASAL specimens only), is one component of a comprehensive MRSA colonization surveillance program. It is not intended to diagnose MRSA infection nor to guide or monitor treatment for MRSA infections.   Basic metabolic panel     Status: Abnormal   Collection Time: 12/05/15  5:23 AM  Result Value Ref Range   Sodium 135 135 - 145 mmol/L   Potassium 3.5 3.5 - 5.1 mmol/L   Chloride 99 (L) 101 - 111 mmol/L   CO2 28 22 -  32 mmol/L   Glucose, Bld 132 (H) 65 - 99 mg/dL   BUN 17 6 - 20 mg/dL   Creatinine, Ser 0.91 0.61 - 1.24 mg/dL   Calcium 8.8 (L) 8.9 - 10.3 mg/dL   GFR calc non Af Amer >60 >60 mL/min   GFR calc Af Amer >60 >60 mL/min    Comment: (NOTE) The eGFR has been calculated using the CKD EPI equation. This calculation has not been validated in all clinical situations. eGFR's persistently <60 mL/min signify possible Chronic Kidney Disease.    Anion gap 8 5 - 15  CBC     Status: Abnormal   Collection Time: 12/05/15  5:23 AM  Result Value Ref Range   WBC 14.2 (H) 3.8 - 10.6 K/uL   RBC 4.22 (L) 4.40 - 5.90 MIL/uL   Hemoglobin 13.7 13.0 - 18.0 g/dL   HCT 39.9 (L) 40.0 - 52.0 %   MCV 94.6 80.0 - 100.0 fL   MCH 32.4 26.0 - 34.0 pg   MCHC 34.2 32.0 - 36.0 g/dL   RDW 13.3 11.5 - 14.5 %   Platelets 241 150 - 440 K/uL  Surgical pathology     Status: None   Collection Time: 12/05/15  2:41 PM  Result Value Ref Range   SURGICAL PATHOLOGY      Surgical Pathology CASE: [id number] PATIENT: Hoover Browns Surgical Pathology Report     SPECIMEN SUBMITTED: A. Femoral head, right  CLINICAL HISTORY: None provided  PRE-OPERATIVE DIAGNOSIS: Right femoral neck fracture  POST-OPERATIVE DIAGNOSIS: Same as pre-op     DIAGNOSIS: A. FEMORAL HEAD, RIGHT; TOTAL HIP ARTHROPLASTY: - CHANGES CONSISTENT WITH FRACTURE. - NORMOCELLULAR BONE MARROW WITH NORMAL-APPEARING TRILINEAGE HEMATOPOIESIS.   GROSS DESCRIPTION: A. Labeled: femoral head right Size of specimen:      Head - 4.6 x 4.5 Cm      Neck - shaggy, 4.5 x 4.0 cm      Additional tissue: bony tissue, 3.6 x 3.7 x 1.1 cm Articular surface: focally granular tan to brown Cut surface: red-brown Other findings: none noted  Block summary: 1 - representative section(s), post decalcification 2 - representative section(s), post decalcification  Final Diagnosis performed by Bryan Lemma, MD.  Electronically signed 12/07/2015 6:09:13PM    The  electronic si gnature indicates that the named Attending Pathologist has evaluated the specimen  Technical component performed at [facility], [address] [place], [place] [address] Lab: [phone] Dir: Darrick Penna. Evette Doffing, MD  Professional component performed at [facility], [facility], [place], [place], [place] [address] Lab: [phone] Dir: Dellia Nims. Rubinas, MD    Basic metabolic panel     Status: Abnormal   Collection Time: 12/06/15  5:14 AM  Result Value Ref Range   Sodium 131 (L) 135 - 145 mmol/L   Potassium 3.3 (L) 3.5 - 5.1 mmol/L   Chloride 100 (L) 101 - 111 mmol/L   CO2 23 22 - 32 mmol/L   Glucose, Bld 141 (H) 65 - 99 mg/dL   BUN 20 6 - 20 mg/dL   Creatinine, Ser 0.85 0.61 - 1.24 mg/dL   Calcium 7.8 (L) 8.9 - 10.3 mg/dL   GFR calc non Af Amer >60 >60 mL/min   GFR calc Af Amer >60 >60 mL/min    Comment: (NOTE) The eGFR has been calculated using the CKD EPI equation. This calculation has not been validated in all clinical situations. eGFR's persistently <60 mL/min signify possible Chronic Kidney Disease.    Anion gap 8 5 - 15  CBC     Status: Abnormal   Collection Time: 12/06/15  5:14 AM  Result Value Ref Range   WBC 14.2 (H) 3.8 - 10.6 K/uL   RBC 3.33 (L) 4.40 - 5.90 MIL/uL   Hemoglobin 11.2 (L) 13.0 - 18.0 g/dL   HCT 31.1 (L) 40.0 - 52.0 %   MCV 93.6 80.0 - 100.0 fL   MCH 33.8 26.0 - 34.0 pg   MCHC 36.1 (H) 32.0 - 36.0 g/dL   RDW 13.5 11.5 - 14.5 %   Platelets 167 150 - 440 K/uL  Urinalysis complete, with microscopic ([facility] only)     Status: Abnormal   Collection Time: 12/07/15 11:20 AM  Result Value Ref Range   Color, Urine YELLOW (A) YELLOW   APPearance CLEAR (A) CLEAR   Glucose, UA NEGATIVE NEGATIVE mg/dL   Bilirubin Urine NEGATIVE NEGATIVE   Ketones, ur TRACE (A) NEGATIVE mg/dL   Specific Gravity, Urine 1.023 1.005 - 1.030   Hgb urine dipstick 1+ (A) NEGATIVE   pH 5.0 5.0 - 8.0   Protein, ur 30 (A) NEGATIVE mg/dL   Nitrite  NEGATIVE NEGATIVE   Leukocytes, UA NEGATIVE NEGATIVE   RBC / HPF 0-5 0 - 5 RBC/hpf   WBC, UA 0-5 0 - 5 WBC/hpf   Bacteria, UA NONE SEEN NONE SEEN   Squamous Epithelial / LPF 0-5 (A) NONE SEEN   Mucous PRESENT   CBC     Status: Abnormal   Collection Time: 12/27/15  4:36 AM  Result Value Ref Range   WBC 7.6 3.8 - 10.6 K/uL   RBC 3.50 (L) 4.40 - 5.90 MIL/uL   Hemoglobin 11.4 (L) 13.0 - 18.0 g/dL   HCT 31.8 (L) 40.0 - 52.0 %   MCV 90.7 80.0 - 100.0 fL   MCH 32.5 26.0 - 34.0 pg   MCHC 35.8 32.0 - 36.0 g/dL   RDW 13.1 11.5 - 14.5 %   Platelets 479 (H) 150 - 440 K/uL  Comprehensive metabolic panel     Status: Abnormal   Collection Time: 12/27/15  4:36 AM  Result Value Ref Range   Sodium 118 (LL) 135 - 145 mmol/L    Comment: CRITICAL RESULT CALLED TO, READ BACK BY AND VERIFIED WITH  [facility] YUAL AT 0559 12/27/15 SDR    Potassium 3.7 3.5 - 5.1 mmol/L   Chloride 87 (L) 101 - 111 mmol/L   CO2 23 22 - 32 mmol/L   Glucose, Bld 106 (H) 65 - 99 mg/dL   BUN 9 6 - 20 mg/dL   Creatinine, Ser 0.59 (L) 0.61 - 1.24 mg/dL   Calcium 9.1 8.9 - 10.3 mg/dL   Total Protein 6.6 6.5 - 8.1 g/dL   Albumin 4.0 3.5 - 5.0 g/dL   AST 27 15 - 41 U/L   ALT 25 17 - 63 U/L   Alkaline Phosphatase 123 38 - 126 U/L   Total Bilirubin 0.9 0.3 - 1.2 mg/dL   GFR calc non Af Amer >60 >60 mL/min   GFR calc Af Amer >60 >60 mL/min    Comment: (NOTE) The eGFR has been calculated using the CKD EPI equation. This calculation has not been validated in all clinical situations. eGFR's persistently <60 mL/min signify possible Chronic Kidney Disease.    Anion gap 8 5 - 15  Troponin I     Status: None   Collection Time: 12/27/15  4:36 AM  Result Value Ref Range   Troponin I <0.03 <0.03 ng/mL  Urinalysis complete, with microscopic ([facility] only)     Status: Abnormal   Collection Time: 12/27/15  7:53 AM  Result Value Ref Range   Color, Urine YELLOW (A) YELLOW   APPearance CLEAR (A) CLEAR   Glucose, UA NEGATIVE NEGATIVE mg/dL    Bilirubin Urine NEGATIVE NEGATIVE   Ketones, ur TRACE (A) NEGATIVE mg/dL   Specific Gravity, Urine 1.013 1.005 - 1.030   Hgb urine dipstick NEGATIVE NEGATIVE   pH 6.0 5.0 - 8.0   Protein, ur NEGATIVE NEGATIVE mg/dL   Nitrite NEGATIVE NEGATIVE   Leukocytes, UA 2+ (A) NEGATIVE   RBC / HPF 0-5 0 - 5 RBC/hpf   WBC, UA TOO NUMEROUS TO COUNT 0 - 5 WBC/hpf   Bacteria, UA RARE (A) NONE SEEN   Squamous Epithelial / LPF 0-5 (A) NONE SEEN   Mucous PRESENT   Sodium, urine, random     Status: None   Collection Time: 12/27/15  7:53 AM  Result Value Ref Range   Sodium, Ur 43 mmol/L  Creatinine, urine, random     Status: None   Collection Time: 12/27/15  7:53 AM  Result Value Ref Range   Creatinine, Urine 154 mg/dL  Sodium     Status: Abnormal   Collection Time: 12/27/15 12:03 PM  Result Value Ref Range   Sodium 120 (L) 135 - 145 mmol/L  Sodium     Status: Abnormal   Collection Time: 12/27/15  5:54 PM  Result Value Ref Range   Sodium 120 (L) 135 -  145 mmol/L  Sodium     Status: Abnormal   Collection Time: 12/27/15 11:51 PM  Result Value Ref Range   Sodium 122 (L) 135 - 145 mmol/L  Basic metabolic panel     Status: Abnormal   Collection Time: 12/28/15  6:10 AM  Result Value Ref Range   Sodium 124 (L) 135 - 145 mmol/L   Potassium 3.9 3.5 - 5.1 mmol/L   Chloride 95 (L) 101 - 111 mmol/L   CO2 23 22 - 32 mmol/L   Glucose, Bld 97 65 - 99 mg/dL   BUN 7 6 - 20 mg/dL   Creatinine, Ser 0.60 (L) 0.61 - 1.24 mg/dL   Calcium 8.3 (L) 8.9 - 10.3 mg/dL   GFR calc non Af Amer >60 >60 mL/min   GFR calc Af Amer >60 >60 mL/min    Comment: (NOTE) The eGFR has been calculated using the CKD EPI equation. This calculation has not been validated in all clinical situations. eGFR's persistently <60 mL/min signify possible Chronic Kidney Disease.    Anion gap 6 5 - 15  CBC     Status: Abnormal   Collection Time: 12/28/15  6:10 AM  Result Value Ref Range   WBC 7.1 3.8 - 10.6 K/uL   RBC 3.00 (L) 4.40 -  5.90 MIL/uL   Hemoglobin 9.8 (L) 13.0 - 18.0 g/dL   HCT 27.3 (L) 40.0 - 52.0 %   MCV 91.0 80.0 - 100.0 fL   MCH 32.7 26.0 - 34.0 pg   MCHC 35.9 32.0 - 36.0 g/dL   RDW 13.0 11.5 - 14.5 %   Platelets 372 150 - 440 K/uL  Sodium     Status: Abnormal   Collection Time: 12/28/15  6:10 AM  Result Value Ref Range   Sodium 123 (L) 135 - 145 mmol/L  Sodium     Status: Abnormal   Collection Time: 12/28/15  6:55 PM  Result Value Ref Range   Sodium 118 (LL) 135 - 145 mmol/L    Comment: CRITICAL RESULT CALLED TO, READ BACK BY AND VERIFIED WITH MELISSA COBB AT 1937 12/28/2015 BY TFK.   Sodium     Status: Abnormal   Collection Time: 12/28/15 11:27 PM  Result Value Ref Range   Sodium 119 (LL) 135 - 145 mmol/L    Comment: CRITICAL RESULT CALLED TO, READ BACK BY AND VERIFIED WITH MELISSA COBB AT 2359 ON 12/28/15 RWW   Sodium     Status: Abnormal   Collection Time: 12/29/15  5:53 AM  Result Value Ref Range   Sodium 123 (L) 135 - 145 mmol/L  Osmolality, urine     Status: Abnormal   Collection Time: 12/29/15  1:15 PM  Result Value Ref Range   Osmolality, Ur 260 (L) 300 - 900 mOsm/kg  Sodium, urine, random     Status: None   Collection Time: 12/29/15  1:15 PM  Result Value Ref Range   Sodium, Ur 50 mmol/L  Osmolality     Status: Abnormal   Collection Time: 12/29/15  2:42 PM  Result Value Ref Range   Osmolality 258 (L) 275 - 295 mOsm/kg  Cortisol     Status: None   Collection Time: 12/29/15  2:42 PM  Result Value Ref Range   Cortisol, Plasma 11.7 ug/dL    Comment: (NOTE) AM    6.7 - 22.6 ug/dL PM   <10.0       ug/dL Performed at [facility]   TSH  Status: None   Collection Time: 12/29/15  2:42 PM  Result Value Ref Range   TSH 1.872 0.350 - 4.500 uIU/mL    Comment: Performed by a 3rd Generation assay with a functional sensitivity of <=0.01 uIU/mL.  Uric acid     Status: Abnormal   Collection Time: 12/29/15  2:42 PM  Result Value Ref Range   Uric Acid, Serum 3.3 (L) 4.4 -  7.6 mg/dL  Sodium     Status: Abnormal   Collection Time: 12/29/15  6:29 PM  Result Value Ref Range   Sodium 123 (L) 135 - 145 mmol/L  Sodium     Status: Abnormal   Collection Time: 12/30/15  6:10 AM  Result Value Ref Range   Sodium 129 (L) 135 - 145 mmol/L  CBC     Status: Abnormal   Collection Time: 12/31/15  4:39 AM  Result Value Ref Range   WBC 5.8 3.8 - 10.6 K/uL   RBC 3.11 (L) 4.40 - 5.90 MIL/uL   Hemoglobin 10.1 (L) 13.0 - 18.0 g/dL   HCT 28.8 (L) 40.0 - 52.0 %   MCV 92.5 80.0 - 100.0 fL   MCH 32.4 26.0 - 34.0 pg   MCHC 35.0 32.0 - 36.0 g/dL   RDW 13.5 11.5 - 14.5 %   Platelets 317 150 - 440 K/uL  Basic metabolic panel     Status: Abnormal   Collection Time: 12/31/15  4:39 AM  Result Value Ref Range   Sodium 130 (L) 135 - 145 mmol/L   Potassium 4.0 3.5 - 5.1 mmol/L   Chloride 99 (L) 101 - 111 mmol/L   CO2 26 22 - 32 mmol/L   Glucose, Bld 94 65 - 99 mg/dL   BUN 12 6 - 20 mg/dL   Creatinine, Ser 0.74 0.61 - 1.24 mg/dL   Calcium 8.8 (L) 8.9 - 10.3 mg/dL   GFR calc non Af Amer >60 >60 mL/min   GFR calc Af Amer >60 >60 mL/min    Comment: (NOTE) The eGFR has been calculated using the CKD EPI equation. This calculation has not been validated in all clinical situations. eGFR's persistently <60 mL/min signify possible Chronic Kidney Disease.    Anion gap 5 5 - 15     Assessment & Plan  Problem List Items Addressed This Visit      Cardiovascular and Mediastinum   HBP (high blood pressure) - Primary     Endocrine   SIADH (syndrome of inappropriate ADH production) (HCC)   Relevant Orders   BASIC METABOLIC PANEL WITH GFR     Musculoskeletal and Integument   Closed right hip fracture, initial encounter ([place])     Other   Hyponatremia   Anxiety disorder due to general medical condition   Hyperlipidemia   Insomnia      No orders of the defined types were placed in this encounter.  1. SIADH (syndrome of inappropriate ADH production) (HCC)  - BASIC METABOLIC  PANEL WITH GFR  2. Essential hypertension   3. Closed right hip fracture, initial encounter ([place])   4. Anxiety disorder due to general medical condition   5. Hyperlipidemia, unspecified hyperlipidemia type   6. Psychophysiological insomnia   7. Hyponatremia   Cont all current meds

## 2016-01-09 DIAGNOSIS — Z96641 Presence of right artificial hip joint: Secondary | ICD-10-CM | POA: Diagnosis not present

## 2016-01-09 DIAGNOSIS — I209 Angina pectoris, unspecified: Secondary | ICD-10-CM | POA: Diagnosis not present

## 2016-01-09 DIAGNOSIS — Z7982 Long term (current) use of aspirin: Secondary | ICD-10-CM | POA: Diagnosis not present

## 2016-01-09 DIAGNOSIS — I10 Essential (primary) hypertension: Secondary | ICD-10-CM | POA: Diagnosis not present

## 2016-01-09 DIAGNOSIS — Z9181 History of falling: Secondary | ICD-10-CM | POA: Diagnosis not present

## 2016-01-09 DIAGNOSIS — Z79891 Long term (current) use of opiate analgesic: Secondary | ICD-10-CM | POA: Diagnosis not present

## 2016-01-09 DIAGNOSIS — S42214G Unspecified nondisplaced fracture of surgical neck of right humerus, subsequent encounter for fracture with delayed healing: Secondary | ICD-10-CM | POA: Diagnosis not present

## 2016-01-09 DIAGNOSIS — W19XXXD Unspecified fall, subsequent encounter: Secondary | ICD-10-CM | POA: Diagnosis not present

## 2016-01-09 DIAGNOSIS — S72001D Fracture of unspecified part of neck of right femur, subsequent encounter for closed fracture with routine healing: Secondary | ICD-10-CM | POA: Diagnosis not present

## 2016-01-09 DIAGNOSIS — S42301D Unspecified fracture of shaft of humerus, right arm, subsequent encounter for fracture with routine healing: Secondary | ICD-10-CM | POA: Diagnosis not present

## 2016-01-10 DIAGNOSIS — W19XXXD Unspecified fall, subsequent encounter: Secondary | ICD-10-CM | POA: Diagnosis not present

## 2016-01-10 DIAGNOSIS — Z96641 Presence of right artificial hip joint: Secondary | ICD-10-CM | POA: Diagnosis not present

## 2016-01-10 DIAGNOSIS — S42301D Unspecified fracture of shaft of humerus, right arm, subsequent encounter for fracture with routine healing: Secondary | ICD-10-CM | POA: Diagnosis not present

## 2016-01-10 DIAGNOSIS — Z9181 History of falling: Secondary | ICD-10-CM | POA: Diagnosis not present

## 2016-01-10 DIAGNOSIS — I10 Essential (primary) hypertension: Secondary | ICD-10-CM | POA: Diagnosis not present

## 2016-01-10 DIAGNOSIS — Z79891 Long term (current) use of opiate analgesic: Secondary | ICD-10-CM | POA: Diagnosis not present

## 2016-01-10 DIAGNOSIS — Z7982 Long term (current) use of aspirin: Secondary | ICD-10-CM | POA: Diagnosis not present

## 2016-01-10 DIAGNOSIS — I209 Angina pectoris, unspecified: Secondary | ICD-10-CM | POA: Diagnosis not present

## 2016-01-10 DIAGNOSIS — S72001D Fracture of unspecified part of neck of right femur, subsequent encounter for closed fracture with routine healing: Secondary | ICD-10-CM | POA: Diagnosis not present

## 2016-01-11 DIAGNOSIS — S42301D Unspecified fracture of shaft of humerus, right arm, subsequent encounter for fracture with routine healing: Secondary | ICD-10-CM | POA: Diagnosis not present

## 2016-01-11 DIAGNOSIS — W19XXXD Unspecified fall, subsequent encounter: Secondary | ICD-10-CM | POA: Diagnosis not present

## 2016-01-11 DIAGNOSIS — Z79891 Long term (current) use of opiate analgesic: Secondary | ICD-10-CM | POA: Diagnosis not present

## 2016-01-11 DIAGNOSIS — Z96641 Presence of right artificial hip joint: Secondary | ICD-10-CM | POA: Diagnosis not present

## 2016-01-11 DIAGNOSIS — S72001D Fracture of unspecified part of neck of right femur, subsequent encounter for closed fracture with routine healing: Secondary | ICD-10-CM | POA: Diagnosis not present

## 2016-01-11 DIAGNOSIS — I10 Essential (primary) hypertension: Secondary | ICD-10-CM | POA: Diagnosis not present

## 2016-01-11 DIAGNOSIS — Z7982 Long term (current) use of aspirin: Secondary | ICD-10-CM | POA: Diagnosis not present

## 2016-01-11 DIAGNOSIS — Z9181 History of falling: Secondary | ICD-10-CM | POA: Diagnosis not present

## 2016-01-11 DIAGNOSIS — I209 Angina pectoris, unspecified: Secondary | ICD-10-CM | POA: Diagnosis not present

## 2016-01-14 DIAGNOSIS — W19XXXD Unspecified fall, subsequent encounter: Secondary | ICD-10-CM | POA: Diagnosis not present

## 2016-01-14 DIAGNOSIS — Z9181 History of falling: Secondary | ICD-10-CM | POA: Diagnosis not present

## 2016-01-14 DIAGNOSIS — I10 Essential (primary) hypertension: Secondary | ICD-10-CM | POA: Diagnosis not present

## 2016-01-14 DIAGNOSIS — Z96641 Presence of right artificial hip joint: Secondary | ICD-10-CM | POA: Diagnosis not present

## 2016-01-14 DIAGNOSIS — Z79891 Long term (current) use of opiate analgesic: Secondary | ICD-10-CM | POA: Diagnosis not present

## 2016-01-14 DIAGNOSIS — S42301D Unspecified fracture of shaft of humerus, right arm, subsequent encounter for fracture with routine healing: Secondary | ICD-10-CM | POA: Diagnosis not present

## 2016-01-14 DIAGNOSIS — S72001D Fracture of unspecified part of neck of right femur, subsequent encounter for closed fracture with routine healing: Secondary | ICD-10-CM | POA: Diagnosis not present

## 2016-01-14 DIAGNOSIS — Z7982 Long term (current) use of aspirin: Secondary | ICD-10-CM | POA: Diagnosis not present

## 2016-01-14 DIAGNOSIS — I209 Angina pectoris, unspecified: Secondary | ICD-10-CM | POA: Diagnosis not present

## 2016-01-15 DIAGNOSIS — I209 Angina pectoris, unspecified: Secondary | ICD-10-CM | POA: Diagnosis not present

## 2016-01-15 DIAGNOSIS — N390 Urinary tract infection, site not specified: Secondary | ICD-10-CM | POA: Diagnosis not present

## 2016-01-15 DIAGNOSIS — W19XXXD Unspecified fall, subsequent encounter: Secondary | ICD-10-CM | POA: Diagnosis not present

## 2016-01-15 DIAGNOSIS — Z7982 Long term (current) use of aspirin: Secondary | ICD-10-CM | POA: Diagnosis not present

## 2016-01-15 DIAGNOSIS — Z96641 Presence of right artificial hip joint: Secondary | ICD-10-CM | POA: Diagnosis not present

## 2016-01-15 DIAGNOSIS — R600 Localized edema: Secondary | ICD-10-CM | POA: Diagnosis not present

## 2016-01-15 DIAGNOSIS — S42301D Unspecified fracture of shaft of humerus, right arm, subsequent encounter for fracture with routine healing: Secondary | ICD-10-CM | POA: Diagnosis not present

## 2016-01-15 DIAGNOSIS — S72001D Fracture of unspecified part of neck of right femur, subsequent encounter for closed fracture with routine healing: Secondary | ICD-10-CM | POA: Diagnosis not present

## 2016-01-15 DIAGNOSIS — Z79891 Long term (current) use of opiate analgesic: Secondary | ICD-10-CM | POA: Diagnosis not present

## 2016-01-15 DIAGNOSIS — E871 Hypo-osmolality and hyponatremia: Secondary | ICD-10-CM | POA: Diagnosis not present

## 2016-01-15 DIAGNOSIS — I10 Essential (primary) hypertension: Secondary | ICD-10-CM | POA: Diagnosis not present

## 2016-01-15 DIAGNOSIS — Z9181 History of falling: Secondary | ICD-10-CM | POA: Diagnosis not present

## 2016-01-16 ENCOUNTER — Telehealth: Payer: Self-pay | Admitting: Family Medicine

## 2016-01-16 DIAGNOSIS — W19XXXD Unspecified fall, subsequent encounter: Secondary | ICD-10-CM | POA: Diagnosis not present

## 2016-01-16 DIAGNOSIS — Z96641 Presence of right artificial hip joint: Secondary | ICD-10-CM | POA: Diagnosis not present

## 2016-01-16 DIAGNOSIS — I10 Essential (primary) hypertension: Secondary | ICD-10-CM | POA: Diagnosis not present

## 2016-01-16 DIAGNOSIS — Z9181 History of falling: Secondary | ICD-10-CM | POA: Diagnosis not present

## 2016-01-16 DIAGNOSIS — S72001D Fracture of unspecified part of neck of right femur, subsequent encounter for closed fracture with routine healing: Secondary | ICD-10-CM | POA: Diagnosis not present

## 2016-01-16 DIAGNOSIS — S42301D Unspecified fracture of shaft of humerus, right arm, subsequent encounter for fracture with routine healing: Secondary | ICD-10-CM | POA: Diagnosis not present

## 2016-01-16 DIAGNOSIS — I209 Angina pectoris, unspecified: Secondary | ICD-10-CM | POA: Diagnosis not present

## 2016-01-16 DIAGNOSIS — Z79891 Long term (current) use of opiate analgesic: Secondary | ICD-10-CM | POA: Diagnosis not present

## 2016-01-16 DIAGNOSIS — Z7982 Long term (current) use of aspirin: Secondary | ICD-10-CM | POA: Diagnosis not present

## 2016-01-16 NOTE — Telephone Encounter (Signed)
Pt started off with sore throat and it's turned into cough, congestion and sneezing.  He is doing rehab at home for hip and has some scheduled for 10-11 in the morning and 2-3 in the afternoon noon.  They asked to have something called in or what to take over the counter.  The call back number is [phone]

## 2016-01-17 DIAGNOSIS — Z7982 Long term (current) use of aspirin: Secondary | ICD-10-CM | POA: Diagnosis not present

## 2016-01-17 DIAGNOSIS — I10 Essential (primary) hypertension: Secondary | ICD-10-CM | POA: Diagnosis not present

## 2016-01-17 DIAGNOSIS — I209 Angina pectoris, unspecified: Secondary | ICD-10-CM | POA: Diagnosis not present

## 2016-01-17 DIAGNOSIS — Z9181 History of falling: Secondary | ICD-10-CM | POA: Diagnosis not present

## 2016-01-17 DIAGNOSIS — S72001D Fracture of unspecified part of neck of right femur, subsequent encounter for closed fracture with routine healing: Secondary | ICD-10-CM | POA: Diagnosis not present

## 2016-01-17 DIAGNOSIS — S42301D Unspecified fracture of shaft of humerus, right arm, subsequent encounter for fracture with routine healing: Secondary | ICD-10-CM | POA: Diagnosis not present

## 2016-01-17 DIAGNOSIS — Z96641 Presence of right artificial hip joint: Secondary | ICD-10-CM | POA: Diagnosis not present

## 2016-01-17 DIAGNOSIS — Z79891 Long term (current) use of opiate analgesic: Secondary | ICD-10-CM | POA: Diagnosis not present

## 2016-01-17 DIAGNOSIS — W19XXXD Unspecified fall, subsequent encounter: Secondary | ICD-10-CM | POA: Diagnosis not present

## 2016-01-17 NOTE — Telephone Encounter (Signed)
Suggest Muycinex DM for cough and Claritin for nasal drainage and warm saline gargles for sore throat.  Needs to be see fairly soon if cough worsens to see if he needs an antibiotic.-jh

## 2016-01-17 NOTE — Telephone Encounter (Signed)
Patients wife aware

## 2016-01-21 DIAGNOSIS — Z79891 Long term (current) use of opiate analgesic: Secondary | ICD-10-CM | POA: Diagnosis not present

## 2016-01-21 DIAGNOSIS — I209 Angina pectoris, unspecified: Secondary | ICD-10-CM | POA: Diagnosis not present

## 2016-01-21 DIAGNOSIS — Z7982 Long term (current) use of aspirin: Secondary | ICD-10-CM | POA: Diagnosis not present

## 2016-01-21 DIAGNOSIS — Z9181 History of falling: Secondary | ICD-10-CM | POA: Diagnosis not present

## 2016-01-21 DIAGNOSIS — Z96641 Presence of right artificial hip joint: Secondary | ICD-10-CM | POA: Diagnosis not present

## 2016-01-21 DIAGNOSIS — W19XXXD Unspecified fall, subsequent encounter: Secondary | ICD-10-CM | POA: Diagnosis not present

## 2016-01-21 DIAGNOSIS — S72001D Fracture of unspecified part of neck of right femur, subsequent encounter for closed fracture with routine healing: Secondary | ICD-10-CM | POA: Diagnosis not present

## 2016-01-21 DIAGNOSIS — I10 Essential (primary) hypertension: Secondary | ICD-10-CM | POA: Diagnosis not present

## 2016-01-21 DIAGNOSIS — S42301D Unspecified fracture of shaft of humerus, right arm, subsequent encounter for fracture with routine healing: Secondary | ICD-10-CM | POA: Diagnosis not present

## 2016-01-22 ENCOUNTER — Ambulatory Visit (INDEPENDENT_AMBULATORY_CARE_PROVIDER_SITE_OTHER): Payer: Medicare Other | Admitting: Family Medicine

## 2016-01-22 ENCOUNTER — Encounter: Payer: Self-pay | Admitting: Family Medicine

## 2016-01-22 VITALS — BP 175/83 | HR 75 | Temp 97.8°F | Resp 16 | Ht 69.0 in | Wt 226.0 lb

## 2016-01-22 DIAGNOSIS — J209 Acute bronchitis, unspecified: Secondary | ICD-10-CM

## 2016-01-22 MED ORDER — AZITHROMYCIN 250 MG PO TABS: ORAL_TABLET | ORAL | 0 refills | Status: AC

## 2016-01-22 NOTE — Progress Notes (Signed)
Name: Erik Palmer   MRN: [id number]    DOB: [birth year]   Date:01/22/2016       Progress Note  Subjective  Chief Complaint  Chief Complaint  Patient presents with  . Follow-up    Cough/ congestion onset 7 days yellow mucous and constipation     HPI Here b/o congestion and cough.  Sick x 1 week.  Nasal drainage and cough productive of Yellow sputum.  No fever.  No chills.  Cough getting worse.  No problem-specific Assessment & Plan notes found for this encounter.   Past Medical History:  Diagnosis Date  . Angina pectoris syndrome (HCC)   . 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 Arthritis   . GERD (gastroesophageal reflux disease)   . Grief at loss of child 09/03/2014  . Hypercholesteremia   . Hypertension   . Prostate enlargement     Social History  Substance Use Topics  . Smoking status: Never Smoker  . Smokeless tobacco: Never Used  . Alcohol use No     Current Outpatient Prescriptions:  .  acetaminophen (TYLENOL) 650 MG CR tablet, Take 650 mg by mouth daily. Reported on 05/08/2015, Disp: , Rfl:  .  aspirin EC 81 MG tablet, Take 81 mg by mouth every morning., Disp: , Rfl:  .  atorvastatin (LIPITOR) 40 MG tablet, Take 40 mg by mouth daily., Disp: , Rfl:  .  cholecalciferol (VITAMIN D) 400 units TABS tablet, Take 400 Units by mouth daily., Disp: , Rfl:  .  docusate sodium (COLACE) 100 MG capsule, Take 1 capsule (100 mg total) by mouth 2 (two) times daily., Disp: 10 capsule, Rfl: 0 .  fluticasone (FLONASE) 50 MCG/ACT nasal spray, INHALE 2 SPRAYS INTO EACH NOSTRIL EVERY DAY, Disp: 16 g, Rfl: 11 .  lisinopril (PRINIVIL,ZESTRIL) 20 MG tablet, TAKE 1 TABLET (20 MG TOTAL) BY MOUTH DAILY., Disp: 30 tablet, Rfl: 6 .  metoprolol (LOPRESSOR) 50 MG tablet, TAKE 1/2 TABLET BY MOUTH TWICE DAILY, Disp: 30 tablet, Rfl: 12 .  naproxen sodium (ANAPROX) 220 MG tablet, Take 1 tablet (220 mg total) by mouth 4 (four) times daily as needed., Disp: 20 tablet, Rfl: 0 .   omeprazole (PRILOSEC) 20 MG capsule, TAKE ONE CAPSULE BY MOUTH TWICE A DAY, Disp: 60 capsule, Rfl: 12 .  polyethylene glycol (MIRALAX / GLYCOLAX) packet, Take 17 g by mouth daily as needed for mild constipation., Disp: 14 each, Rfl: 0 .  QUEtiapine (SEROQUEL) 50 MG tablet, Take 1 tablet (50 mg total) by mouth at bedtime., Disp: 90 tablet, Rfl: 1 .  tamsulosin (FLOMAX) 0.4 MG CAPS capsule, TAKE ONE CAPSULE BY MOUTH EVERY EVENING (Patient taking differently: TAKE ONE CAPSULE BY MOUTH EVERY MORNING), Disp: 30 capsule, Rfl: 12 .  azithromycin (ZITHROMAX Z-PAK) 250 MG tablet, Take 2 tablets (500 mg) on  Day 1,  followed by 1 tablet (250 mg) once daily on Days 2 through 5., Disp: 6 each, Rfl: 0  Allergies  Allergen Reactions  . Solu-Medrol [Methylprednisolone Acetate] Anxiety    Review of Systems  Constitutional: Negative for chills, fever, malaise/fatigue and weight loss.  HENT: Positive for congestion. Negative for hearing loss, sore throat and tinnitus.   Eyes: Negative for blurred vision and double vision.  Respiratory: Positive for cough, sputum production and shortness of breath. Negative for wheezing.   Cardiovascular: Positive for leg swelling. Negative for chest pain and palpitations.  Gastrointestinal: Negative for abdominal pain, blood in stool and heartburn.  Genitourinary: Negative for  dysuria, frequency and urgency.  Musculoskeletal: Negative for myalgias.  Skin: Negative for rash.  Neurological: Negative for dizziness, tingling, tremors, weakness and headaches.      Objective  Vitals:   01/22/16 0841 01/22/16 0849  BP: (!) 179/80 (!) 175/83  Pulse: 81 75  Resp: 16   Temp: 97.8 F (36.6 C)   TempSrc: Oral   Weight: 102.5 kg (226 lb)   Height: 5\' 9"  (1.753 m)      Physical Exam  Constitutional: He is well-developed, well-nourished, and in no distress. No distress.  HENT:  Head: Normocephalic and atraumatic.  Right Ear: External ear normal.  Left Ear: External ear  normal.  Nose: Rhinorrhea present.  Mouth/Throat: Oropharynx is clear and moist.  Eyes: Conjunctivae and EOM are normal. Pupils are equal, round, and reactive to light.  Neck: Normal range of motion. Neck supple. No thyromegaly present.  Cardiovascular: Normal rate, regular rhythm and normal heart sounds.  Exam reveals no gallop and no friction rub.   No murmur heard. Pulmonary/Chest: Effort normal. No respiratory distress. He has wheezes (faont wheezes). He has no rales.  Coarse rhonchi throughout.  Musculoskeletal: He exhibits edema (mild bilateral pedal edema).  Lymphadenopathy:    He has no cervical adenopathy.  Vitals reviewed.        Assessment & Plan  1. Acute bronchitis, unspecified organism  - azithromycin (ZITHROMAX Z-PAK) 250 MG tablet; Take 2 tablets (500 mg) on  Day 1,  followed by 1 tablet (250 mg) once daily on Days 2 through 5.  Dispense: 6 each; Refill: 0 Cont Mucinex DM annd Claritin

## 2016-01-23 DIAGNOSIS — Z79891 Long term (current) use of opiate analgesic: Secondary | ICD-10-CM | POA: Diagnosis not present

## 2016-01-23 DIAGNOSIS — Z7982 Long term (current) use of aspirin: Secondary | ICD-10-CM | POA: Diagnosis not present

## 2016-01-23 DIAGNOSIS — Z9181 History of falling: Secondary | ICD-10-CM | POA: Diagnosis not present

## 2016-01-23 DIAGNOSIS — I209 Angina pectoris, unspecified: Secondary | ICD-10-CM | POA: Diagnosis not present

## 2016-01-23 DIAGNOSIS — S42301D Unspecified fracture of shaft of humerus, right arm, subsequent encounter for fracture with routine healing: Secondary | ICD-10-CM | POA: Diagnosis not present

## 2016-01-23 DIAGNOSIS — I10 Essential (primary) hypertension: Secondary | ICD-10-CM | POA: Diagnosis not present

## 2016-01-23 DIAGNOSIS — Z96641 Presence of right artificial hip joint: Secondary | ICD-10-CM | POA: Diagnosis not present

## 2016-01-23 DIAGNOSIS — W19XXXD Unspecified fall, subsequent encounter: Secondary | ICD-10-CM | POA: Diagnosis not present

## 2016-01-23 DIAGNOSIS — S72001D Fracture of unspecified part of neck of right femur, subsequent encounter for closed fracture with routine healing: Secondary | ICD-10-CM | POA: Diagnosis not present

## 2016-02-23 ENCOUNTER — Other Ambulatory Visit: Payer: Self-pay | Admitting: Psychiatry

## 2016-03-17 ENCOUNTER — Ambulatory Visit: Payer: Self-pay | Admitting: Family Medicine

## 2016-03-17 ENCOUNTER — Ambulatory Visit: Payer: 59 | Admitting: Psychiatry

## 2016-04-16 ENCOUNTER — Encounter: Payer: Self-pay | Admitting: Family Medicine

## 2016-04-16 ENCOUNTER — Encounter: Payer: Self-pay | Admitting: Psychiatry

## 2016-04-16 ENCOUNTER — Ambulatory Visit (INDEPENDENT_AMBULATORY_CARE_PROVIDER_SITE_OTHER): Payer: 59 | Admitting: Psychiatry

## 2016-04-16 ENCOUNTER — Ambulatory Visit (INDEPENDENT_AMBULATORY_CARE_PROVIDER_SITE_OTHER): Payer: Medicare Other | Admitting: Family Medicine

## 2016-04-16 VITALS — BP 158/71 | HR 83 | Temp 97.6°F | Wt 229.6 lb

## 2016-04-16 VITALS — BP 170/70 | HR 76 | Temp 97.8°F | Resp 16 | Ht 69.0 in | Wt 229.0 lb

## 2016-04-16 DIAGNOSIS — I10 Essential (primary) hypertension: Secondary | ICD-10-CM | POA: Diagnosis not present

## 2016-04-16 DIAGNOSIS — E785 Hyperlipidemia, unspecified: Secondary | ICD-10-CM

## 2016-04-16 DIAGNOSIS — E222 Syndrome of inappropriate secretion of antidiuretic hormone: Secondary | ICD-10-CM | POA: Diagnosis not present

## 2016-04-16 DIAGNOSIS — E871 Hypo-osmolality and hyponatremia: Secondary | ICD-10-CM

## 2016-04-16 DIAGNOSIS — K5903 Drug induced constipation: Secondary | ICD-10-CM

## 2016-04-16 DIAGNOSIS — F064 Anxiety disorder due to known physiological condition: Secondary | ICD-10-CM | POA: Diagnosis not present

## 2016-04-16 DIAGNOSIS — F330 Major depressive disorder, recurrent, mild: Secondary | ICD-10-CM | POA: Diagnosis not present

## 2016-04-16 MED ORDER — DOCUSATE SODIUM 100 MG PO CAPS: 100.0000 mg | ORAL_CAPSULE | Freq: Two times a day (BID) | ORAL | 12 refills | Status: DC

## 2016-04-16 MED ORDER — METOPROLOL TARTRATE 50 MG PO TABS: ORAL_TABLET | ORAL | 3 refills | Status: DC

## 2016-04-16 MED ORDER — QUETIAPINE FUMARATE 50 MG PO TABS: 50.0000 mg | ORAL_TABLET | Freq: Every day | ORAL | 1 refills | Status: DC

## 2016-04-16 NOTE — Progress Notes (Signed)
Psychiatric MD/NP Follow up Note  Patient Identification: Erik Palmer MRN:  [id number] Date of Evaluation:  04/16/2016 Referral Source: [facility] Chief Complaint:   Chief Complaint    Follow-up; Medication Refill     Visit Diagnosis:    ICD-9-CM ICD-10-CM   1. MDD (major depressive disorder), recurrent episode, mild (HCC) 296.31 F33.0    Diagnosis:   Patient Active Problem List   Diagnosis Date Noted  . SIADH (syndrome of inappropriate ADH production) (HCC) [E22.2] 01/08/2016  . Malnutrition of moderate degree [E44.0] 12/29/2015  . Insomnia [G47.00] 12/28/2015  . Fracture of unspecified part of neck of right femur, subsequent encounter for closed fracture with routine healing [S72.001D] 12/27/2015  . Closed right hip fracture, initial encounter (HCC) [S72.001A] 12/04/2015  . Hyperlipidemia [E78.5] 05/08/2015  . HBP (high blood pressure) [I10] 02/13/2015  . Morbid obesity (HCC) [E66.01] 12/22/2014  . Medicare annual wellness visit, subsequent [Z00.00] 12/22/2014  . Acute anxiety [F41.9] 09/12/2014  . Grief at loss of child [F43.21, Z63.4] 09/03/2014  . Anxiety disorder due to general medical condition [F06.4] 09/03/2014  . Hyponatremia [E87.1] 09/02/2014   History of Present Illness:    Patient is a 82 year old male who presented with his wife for follow up. He comes for appointment every 6 months. His wife reported that he fell from the ladder in late October and was in the hospital for a week and was in the rehabilitation for 3 weeks. He is finally doing well and has started to walk. He was also concerned as his primary care physician Dr. Juanetta Gosling  has decided to retire and relieve his practice in the end of May. He reported that he was with him for the past many years. Patient reported that he is having swelling  in his legs .[name] He was prescribed medication in the past but he has history of hyponatremia. His wife has been giving him medications for the same by Dr.  Juanetta Gosling is not going to refill the prescription. We discussed about other medications he is going to discuss with Dr. Juanetta Gosling about the same. Patient reported that he sleeping well with the help of Seroquel. He remains compliant with his medications. He currently denied having any suicidal homicidal ideations or plans.   His wife helps him with the medications and she has a list of all his prescribed medications.  His mood symptoms are under control and he appears calm and collective during the interview.      Past Medical History:  Past Medical History:  Diagnosis Date  . Angina pectoris syndrome (HCC)   . 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 Arthritis   . GERD (gastroesophageal reflux disease)   . Grief at loss of child 09/03/2014  . Hypercholesteremia   . Hypertension   . Prostate enlargement     Past Surgical History:  Procedure Laterality Date  . TOTAL HIP ARTHROPLASTY Right 12/05/2015   Procedure: TOTAL HIP ARTHROPLASTY ANTERIOR APPROACH;  Surgeon: Kennedy Bucker, MD;  Location: [facility] ORS;  Service: Orthopedics;  Laterality: Right;   Family History:  Family History  Problem Relation Age of Onset  . Hypertension Mother   . Heart block Mother   . Stroke Mother   . Depression Mother   . Cancer Father   . Heart attack Sister   . COPD Sister   . Hypertension Brother   . Hypertension Brother   . Hypertension Brother   . Hypertension Brother   . Heart attack Brother   .  Diabetes Neg Hx    Social History:   Social History   Social History  . Marital status: Married    Spouse name: N/A  . Number of children: N/A  . Years of education: N/A   Social History Main Topics  . Smoking status: Never Smoker  . Smokeless tobacco: Never Used  . Alcohol use No  . Drug use: No  . Sexual activity: Not Currently   Other Topics Concern  . None   Social History Narrative  . None   Additional Social History:   Currently married and lives with his wife.  He had 3 children and one of them passed away. She was in [employer] and is currently retired.  Musculoskeletal: Strength & Muscle Tone: within normal limits Gait & Station: normal Patient leans: Right  Psychiatric Specialty Exam: HPI  Review of Systems  Psychiatric/Behavioral: Positive for depression. The patient is nervous/anxious and has insomnia.   All other systems reviewed and are negative.   Blood pressure (!) 158/71, pulse 83, temperature 97.6 F (36.4 C), temperature source Oral, weight 229 lb 9.6 oz (104.1 kg).Body mass index is 33.91 kg/m.  General Appearance: Casual and Fairly Groomed  Eye Contact:  Fair  Speech:  Clear and Coherent and Normal Rate  Volume:  Normal  Mood:  Euthymic  Affect:  Congruent  Thought Process:  Coherent  Orientation:  Full (Time, Place, and Person)  Thought Content:  WDL  Suicidal Thoughts:  No  Homicidal Thoughts:  No  Memory:  Immediate;   Fair  Judgement:  Fair  Insight:  Fair  Psychomotor Activity:  Normal  Concentration:  Fair  Recall:  [employer] of Knowledge:Fair  Language: Fair  Akathisia:  No  Handed:  Right  AIMS (if indicated):    Assets:  Communication Skills Desire for Improvement Housing Intimacy Physical Health Social Support  ADL's:  Intact  Cognition: WNL  Sleep:  normal     Allergies:   Allergies  Allergen Reactions  . Solu-Medrol [Methylprednisolone Acetate] Anxiety   Current Medications: Current Outpatient Prescriptions  Medication Sig Dispense Refill  . acetaminophen (TYLENOL) 650 MG CR tablet Take 650 mg by mouth daily. Reported on 05/08/2015    . aspirin EC 81 MG tablet Take 81 mg by mouth every morning.    [name] atorvastatin (LIPITOR) 40 MG tablet Take 40 mg by mouth daily.    . cholecalciferol (VITAMIN D) 400 units TABS tablet Take 400 Units by mouth daily.    [name] docusate sodium (COLACE) 100 MG capsule Take 1 capsule (100 mg total) by mouth 2 (two) times daily. 60 capsule 12  . fluticasone (FLONASE)  50 MCG/ACT nasal spray INHALE 2 SPRAYS INTO EACH NOSTRIL EVERY DAY 16 g 11  . lisinopril (PRINIVIL,ZESTRIL) 20 MG tablet TAKE 1 TABLET (20 MG TOTAL) BY MOUTH DAILY. 30 tablet 6  . metoprolol (LOPRESSOR) 50 MG tablet Take 1 tablet twice a day 180 tablet 3  . omeprazole (PRILOSEC) 20 MG capsule TAKE ONE CAPSULE BY MOUTH TWICE A DAY 60 capsule 12  . polyethylene glycol (MIRALAX / GLYCOLAX) packet Take 17 g by mouth daily as needed for mild constipation. 14 each 0  . QUEtiapine (SEROQUEL) 50 MG tablet Take 1 tablet (50 mg total) by mouth at bedtime. 90 tablet 1  . tamsulosin (FLOMAX) 0.4 MG CAPS capsule TAKE ONE CAPSULE BY MOUTH EVERY EVENING (Patient taking differently: TAKE ONE CAPSULE BY MOUTH EVERY MORNING) 30 capsule 12   No current facility-administered medications  for this visit.     Previous Psychotropic Medications: Received Soulmedrol in 2014 led  to his anxiety symptoms Valium and Prozac -2014 by Dr Juanetta GoslingHawkins  Prozac again in this summer Celexa prescribed by Dr Juanetta GoslingHawkins September 04 2014- anxiety and depression.  Insomnia and takes Meclizine, Xanax and Motrin PM to help him sleep.  Dr Juanetta GoslingHawkins started Nortriptyline for sleep, did not help.   Substance Abuse History in the last 12 months:  No.  Consequences of Substance Abuse: Negative NA  Medical Decision Making:  Review of Psycho-Social Stressors (1) and Review and summation of old records (2)  Treatment Plan Summary: Medication management   Discussed with patient what the medications and he will continue on the following medications  Mood symptoms/ sleep Continue Seroquel 50 mg by mouth daily at bedtime and was given 90 day supply of the medications Discussed with him in detail about the medication dose benefits and alternatives.   Follow-up in 6  month or earlier    This note was generated in part or whole with voice recognition software. Voice regonition is usually quite accurate but there are transcription errors that can  and very often do occur. I apologize for any typographical errors that were not detected and corrected.    Brandy HaleUzma Sonakshi Rolland, MD

## 2016-04-16 NOTE — Progress Notes (Signed)
Name: Erik Palmer   MRN: [id number]    DOB: [birth year]   Date:04/16/2016       Progress Note  Subjective  Chief Complaint  Chief Complaint  Patient presents with  . Hypertension    HPI Here for f/u of HBP and elevated lipids.  Has mental health issues controlled by meds.  He is feeling well overall.  Taking meds.   He c/o swelling in legs.  He has been takiing some diuretic even though he has had serious hyponatremia in past. Has hx of SIADH.  He refuses to wear compression hose. No problem-specific Assessment & Plan notes found for this encounter.   Past Medical History:  Diagnosis Date  . Angina pectoris syndrome (HCC)   . 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 Arthritis   . GERD (gastroesophageal reflux disease)   . Grief at loss of child 09/03/2014  . Hypercholesteremia   . Hypertension   . Prostate enlargement     Past Surgical History:  Procedure Laterality Date  . TOTAL HIP ARTHROPLASTY Right 12/05/2015   Procedure: TOTAL HIP ARTHROPLASTY ANTERIOR APPROACH;  Surgeon: Kennedy Bucker, MD;  Location: [facility] ORS;  Service: Orthopedics;  Laterality: Right;    Family History  Problem Relation Age of Onset  . Hypertension Mother   . Heart block Mother   . Stroke Mother   . Depression Mother   . Cancer Father   . Heart attack Sister   . COPD Sister   . Hypertension Brother   . Hypertension Brother   . Hypertension Brother   . Hypertension Brother   . Heart attack Brother   . Diabetes Neg Hx     Social History   Social History  . Marital status: Married    Spouse name: N/A  . Number of children: N/A  . Years of education: N/A   Occupational History  . Not on file.   Social History Main Topics  . Smoking status: Never Smoker  . Smokeless tobacco: Never Used  . Alcohol use No  . Drug use: No  . Sexual activity: Not Currently   Other Topics Concern  . Not on file   Social History Narrative  . No narrative on file     Current  Outpatient Prescriptions:  .  acetaminophen (TYLENOL) 650 MG CR tablet, Take 650 mg by mouth daily. Reported on 05/08/2015, Disp: , Rfl:  .  aspirin EC 81 MG tablet, Take 81 mg by mouth every morning., Disp: , Rfl:  .  atorvastatin (LIPITOR) 40 MG tablet, Take 40 mg by mouth daily., Disp: , Rfl:  .  cholecalciferol (VITAMIN D) 400 units TABS tablet, Take 400 Units by mouth daily., Disp: , Rfl:  .  docusate sodium (COLACE) 100 MG capsule, Take 1 capsule (100 mg total) by mouth 2 (two) times daily., Disp: 60 capsule, Rfl: 12 .  fluticasone (FLONASE) 50 MCG/ACT nasal spray, INHALE 2 SPRAYS INTO EACH NOSTRIL EVERY DAY, Disp: 16 g, Rfl: 11 .  lisinopril (PRINIVIL,ZESTRIL) 20 MG tablet, TAKE 1 TABLET (20 MG TOTAL) BY MOUTH DAILY., Disp: 30 tablet, Rfl: 6 .  metoprolol (LOPRESSOR) 50 MG tablet, Take 1 tablet twice a day, Disp: 180 tablet, Rfl: 3 .  omeprazole (PRILOSEC) 20 MG capsule, TAKE ONE CAPSULE BY MOUTH TWICE A DAY, Disp: 60 capsule, Rfl: 12 .  polyethylene glycol (MIRALAX / GLYCOLAX) packet, Take 17 g by mouth daily as needed for mild constipation., Disp: 14 each, Rfl: 0 .  QUEtiapine (SEROQUEL) 50 MG tablet, TAKE 1 TABLET (50 MG TOTAL) BY MOUTH AT BEDTIME., Disp: 90 tablet, Rfl: 1 .  QUEtiapine (SEROQUEL) 50 MG tablet, TAKE 1 TABLET (50 MG TOTAL) BY MOUTH AT BEDTIME., Disp: 90 tablet, Rfl: 1 .  tamsulosin (FLOMAX) 0.4 MG CAPS capsule, TAKE ONE CAPSULE BY MOUTH EVERY EVENING (Patient taking differently: TAKE ONE CAPSULE BY MOUTH EVERY MORNING), Disp: 30 capsule, Rfl: 12  Allergies  Allergen Reactions  . Solu-Medrol [Methylprednisolone Acetate] Anxiety     Review of Systems  Constitutional: Negative for chills, fever, malaise/fatigue and weight loss.  HENT: Negative for hearing loss and tinnitus.   Eyes: Negative for blurred vision and double vision.  Respiratory: Negative for cough, shortness of breath and wheezing.   Cardiovascular: Positive for leg swelling. Negative for chest pain and  palpitations.  Gastrointestinal: Negative for abdominal pain, blood in stool and heartburn.  Genitourinary: Negative for dysuria, frequency and urgency.  Musculoskeletal: Negative for joint pain and myalgias.  Skin: Negative for rash.  Neurological: Positive for tremors. Negative for dizziness, tingling, weakness and headaches.      Objective  Vitals:   04/16/16 0845 04/16/16 0853 04/16/16 0930  BP: (!) 165/92 (!) 150/70 (!) 170/70  Pulse: 67  76  Resp: 16    Temp: 97.8 F (36.6 C)    TempSrc: Oral    Weight: 229 lb (103.9 kg)    Height: 5\' 9"  (1.753 m)      Physical Exam  Constitutional: He is oriented to person, place, and time and well-developed, well-nourished, and in no distress. No distress.  HENT:  Head: Normocephalic and atraumatic.  Eyes: Conjunctivae and EOM are normal. Pupils are equal, round, and reactive to light. No scleral icterus.  Neck: Normal range of motion. Neck supple. Carotid bruit is not present. No thyromegaly present.  Cardiovascular: Normal rate, regular rhythm and normal heart sounds.  Exam reveals no gallop and no friction rub.   No murmur heard. Pulmonary/Chest: Effort normal and breath sounds normal. No respiratory distress. He has no wheezes. He has no rales.  Abdominal: Soft. Bowel sounds are normal. He exhibits no distension, no abdominal bruit and no mass. There is no tenderness.  Musculoskeletal: He exhibits edema (trace bilateral pedal edema).  Lymphadenopathy:    He has no cervical adenopathy.  Neurological: He is alert and oriented to person, place, and time.  Vitals reviewed.      No results found for this or any previous visit (from the past 2160 hour(s)).   Assessment & Plan  Problem List Items Addressed This Visit      Cardiovascular and Mediastinum   HBP (high blood pressure) - Primary   Relevant Medications   metoprolol (LOPRESSOR) 50 MG tablet   Other Relevant Orders   COMPLETE METABOLIC PANEL WITH GFR   CBC with  Differential     Endocrine   SIADH (syndrome of inappropriate ADH production) (HCC)     Other   Hyponatremia   Anxiety disorder due to general medical condition   Morbid obesity (HCC)   Hyperlipidemia   Relevant Medications   metoprolol (LOPRESSOR) 50 MG tablet   Other Relevant Orders   Lipid Profile    Other Visit Diagnoses    Drug-induced constipation       Relevant Medications   docusate sodium (COLACE) 100 MG capsule      Meds ordered this encounter  Medications  . metoprolol (LOPRESSOR) 50 MG tablet    Sig: Take 1 tablet twice a  day    Dispense:  180 tablet    Refill:  3  . docusate sodium (COLACE) 100 MG capsule    Sig: Take 1 capsule (100 mg total) by mouth 2 (two) times daily.    Dispense:  60 capsule    Refill:  12   1. Essential hypertension cont Lisinopril - COMPLETE METABOLIC PANEL WITH GFR - CBC with Differential - metoprolol (LOPRESSOR) 50 MG tablet; Take 1 tablet twice a day  Dispense: 180 tablet; Refill: 3  2. SIADH (syndrome of inappropriate ADH production) (HCC) DO NOT TAKE ANY DIURETICS!!!  3. Anxiety disorder due to general medical condition Cont see Psych  4. Morbid obesity (HCC) Discussed weight loss.  5. Hyponatremia   6. Hyperlipidemia, unspecified hyperlipidemia type Cont Lipitor - Lipid Profile  7. Drug-induced constipation  - docusate sodium (COLACE) 100 MG capsule; Take 1 capsule (100 mg total) by mouth 2 (two) times daily.  Dispense: 60 capsule; Refill: 12

## 2016-04-17 ENCOUNTER — Ambulatory Visit: Payer: Self-pay | Admitting: Family Medicine

## 2016-04-18 ENCOUNTER — Other Ambulatory Visit: Payer: Medicare Other

## 2016-04-18 LAB — CBC WITH DIFFERENTIAL/PLATELET
BASOS ABS: 0 {cells}/uL (ref 0–200)
Basophils Relative: 0 %
Eosinophils Absolute: 136 cells/uL (ref 15–500)
Eosinophils Relative: 2 %
HEMATOCRIT: 38.7 % (ref 38.5–50.0)
Hemoglobin: 12.8 g/dL — ABNORMAL LOW (ref 13.2–17.1)
LYMPHS PCT: 34 %
Lymphs Abs: 2312 cells/uL (ref 850–3900)
MCH: 30.9 pg (ref 27.0–33.0)
MCHC: 33.1 g/dL (ref 32.0–36.0)
MCV: 93.5 fL (ref 80.0–100.0)
MPV: 9.8 fL (ref 7.5–12.5)
Monocytes Absolute: 544 cells/uL (ref 200–950)
Monocytes Relative: 8 %
NEUTROS PCT: 56 %
Neutro Abs: 3808 cells/uL (ref 1500–7800)
Platelets: 281 10*3/uL (ref 140–400)
RBC: 4.14 MIL/uL — ABNORMAL LOW (ref 4.20–5.80)
RDW: 14.3 % (ref 11.0–15.0)
WBC: 6.8 10*3/uL (ref 3.8–10.8)

## 2016-04-18 LAB — COMPLETE METABOLIC PANEL WITH GFR
ALBUMIN: 4 g/dL (ref 3.6–5.1)
ALK PHOS: 56 U/L (ref 40–115)
ALT: 11 U/L (ref 9–46)
AST: 13 U/L (ref 10–35)
BUN: 13 mg/dL (ref 7–25)
CALCIUM: 9.2 mg/dL (ref 8.6–10.3)
CO2: 27 mmol/L (ref 20–31)
CREATININE: 0.76 mg/dL (ref 0.70–1.18)
Chloride: 104 mmol/L (ref 98–110)
GFR, Est African American: >89 mL/min (ref >=60)
GFR, Est Non African American: 87 mL/min (ref >=60)
GLUCOSE: 87 mg/dL (ref 65–99)
POTASSIUM: 3.9 mmol/L (ref 3.5–5.3)
SODIUM: 141 mmol/L (ref 135–146)
Total Bilirubin: 0.6 mg/dL (ref 0.2–1.2)
Total Protein: 6.3 g/dL (ref 6.1–8.1)

## 2016-04-18 LAB — LIPID PANEL
Cholesterol: 147 mg/dL (ref <200)
HDL: 48 mg/dL (ref >40)
LDL CALC: 70 mg/dL (ref <100)
Total CHOL/HDL Ratio: 3.1 Ratio (ref <5.0)
Triglycerides: 147 mg/dL (ref <150)
VLDL: 29 mg/dL (ref <30)

## 2016-05-23 ENCOUNTER — Other Ambulatory Visit: Payer: Self-pay | Admitting: Family Medicine

## 2016-05-23 ENCOUNTER — Telehealth: Payer: Self-pay | Admitting: *Deleted

## 2016-05-23 NOTE — Telephone Encounter (Signed)
Patient had pharmacy send request for hctz refill. Per chart patient is not to take diuretic due to medical condition therefore request has been denied.

## 2016-05-29 ENCOUNTER — Other Ambulatory Visit: Payer: Self-pay | Admitting: Family Medicine

## 2016-06-23 ENCOUNTER — Other Ambulatory Visit: Payer: Self-pay

## 2016-06-23 DIAGNOSIS — I10 Essential (primary) hypertension: Secondary | ICD-10-CM

## 2016-06-23 MED ORDER — LISINOPRIL 20 MG PO TABS: 20.0000 mg | ORAL_TABLET | Freq: Every day | ORAL | 0 refills | Status: DC

## 2016-06-23 NOTE — Telephone Encounter (Signed)
Pharmacy requesting 90 day supply refill Last ov 04/16/16 Last filled 12/31/15 Please review. Thank you. sd

## 2016-07-22 ENCOUNTER — Telehealth: Payer: Self-pay | Admitting: Family Medicine

## 2016-07-22 ENCOUNTER — Encounter: Payer: Self-pay | Admitting: Family Medicine

## 2016-07-22 ENCOUNTER — Ambulatory Visit (INDEPENDENT_AMBULATORY_CARE_PROVIDER_SITE_OTHER): Payer: Medicare Other | Admitting: Family Medicine

## 2016-07-22 VITALS — BP 162/78 | HR 71 | Temp 98.3°F | Resp 16 | Ht 69.0 in | Wt 228.0 lb

## 2016-07-22 DIAGNOSIS — N138 Other obstructive and reflux uropathy: Secondary | ICD-10-CM | POA: Diagnosis not present

## 2016-07-22 DIAGNOSIS — Z125 Encounter for screening for malignant neoplasm of prostate: Secondary | ICD-10-CM | POA: Diagnosis not present

## 2016-07-22 DIAGNOSIS — I10 Essential (primary) hypertension: Secondary | ICD-10-CM | POA: Diagnosis not present

## 2016-07-22 DIAGNOSIS — N401 Enlarged prostate with lower urinary tract symptoms: Secondary | ICD-10-CM | POA: Diagnosis not present

## 2016-07-22 DIAGNOSIS — E782 Mixed hyperlipidemia: Secondary | ICD-10-CM

## 2016-07-22 DIAGNOSIS — R7309 Other abnormal glucose: Secondary | ICD-10-CM | POA: Diagnosis not present

## 2016-07-22 MED ORDER — ATORVASTATIN CALCIUM 40 MG PO TABS: 40.0000 mg | ORAL_TABLET | Freq: Every day | ORAL | 3 refills | Status: DC

## 2016-07-22 NOTE — Patient Instructions (Signed)
Thank you for coming to the clinic today.  1. For BPH   To help reduce urination overnight and prostate symptoms  FIRST - 1-2 weeks take ONE pill in evening to see if it helps  NEXT if not helping then Recommend increasing Flomax dose to 2 pills in morning at same time, try this for 1-2 weeks, if not helping can try 1 pill in MORNING and 1 pill in evening instead.  - Be careful standing up suddenly, as this can make you feel dizzy and lightheaded initially, but this should improve - Try to limit caffeine, excess fluids in evening  You can let me know if you need a new prescription for Flomax to have more pills  ----------- Check PSA today and A1c (diabetes screening test)  Limit fluids in evening to reduce amount of urination overnight  ------- Check BP more regularly 2-3x week for now, write down readings, if consistently >140/90, then notify our office, and we may need to make a medication change.  Sent in new rx Atorvastatin 40mg   Please schedule a follow-up appointment with Dr. Althea CharonKaramalegos in 4 weeks for HTN, BPH  If you have any other questions or concerns, please feel free to call the clinic or send a message through MyChart. You may also schedule an earlier appointment if necessary.  Erik PilarAlexander Gwen Sarvis, DO [facility]

## 2016-07-22 NOTE — Progress Notes (Signed)
Subjective:    Patient ID: Erik Palmer, male    DOB: [birth year], 82 y.o.   MRN: [id number]  Erik Palmer is a 82 y.o. male presenting on 07/22/2016 for Hypertension  Patient present with his wife, [name]. They both provide history.  HPI   CHRONIC HTN: Reports last visit 04/2016 with prior PCP for HTN, had SBP >170 in past usually improves on re-check. Checks BP at home rarely now every 1-2 months, with readings avg 130s. Current Meds - Metoprolol 78m BID (previously half tablet for 254mBID), Lisinopril 2075m Failed HCTZ in past due to hyponatremia Reports good compliance, took meds today. Tolerating well, w/o complaints. Lifestyle: - Exercise: Limited exercise. Still recovering gradually from fall injury 12/2015 with R hip fracture and arm, s/p surgical fixation - Diet: Drinks water, not following any special diet. No strict low sodium diet - Takes ASA 80m30mily - Admits edema in lower extremities, ankles, no longer wearing compression hose Denies CP, dyspnea, HA, edema, dizziness / lightheadedness  BPH with LUTS / Nocturia - Reports chronic problem for years with reported BPH enlarged prostate and, admits nocturia multiple times nightly, 10-12 times nightly - Taking Flomax 0.4mg 7mfor while now, at one point he thinks he switched to PM dosing, per label new rx had been sent before, but cannot recall if this helped his symptoms, now taking daily in AM. Never tried other medicine - No personal or family history of Prostate Cancer. - Last DRE years ago, was told normal - He believes that has had prior PSA test and reportedly normal, does not recall having this done in past 2-3 years.  AUA BPH Symptom Score over past 1 month 1. Sensation of not emptying bladder post void - 3 2. Urinate less than 2 hour after finish last void - 4 3. Start/Stop several times during void - 0 4. Difficult to postpone urination - 1 5. Weak urinary stream - 4 6. Push or strain urination - 2 7.  Nocturia - 10 times  Total Score: 24 (Severe BPH symptoms)  HYPERLIPIDEMIA: - Reports no new concerns. Last lipid panel 04/2016, controlled on Statin. - Currently taking Atorvastatin 40mg 67my (takes half of 80mg t51m, tolerating well without side effects or myalgias - Requesting new rx for 40mg ta58mor one whole pill daily instead of half tabs  Elevated Glucose Prior history of abnormal glucose >100 in past with labs. No recent A1c measurement available. - Interested in screening for DM screening test today - No family history of DM  Social History  Substance Use Topics  . Smoking status: Never Smoker  . Smokeless tobacco: Never Used  . Alcohol use No    Review of Systems Per HPI unless specifically indicated above     Objective:    BP (!) 162/78 (BP Location: Left Arm, Cuff Size: Normal)   Pulse 71   Temp 98.3 F (36.8 C) (Oral)   Resp 16   Ht _0  (1.753 m)   Wt 228 lb (103.4 kg)   BMI 33.67 kg/m   Wt Readings from Last 3 Encounters:  07/22/16 228 lb (103.4 kg)  04/16/16 229 lb (103.9 kg)  01/22/16 226 lb (102.5 kg)    Physical Exam  Constitutional: He is oriented to person, place, and time. He appears well-developed and well-nourished. No distress.  Well-appearing, comfortable, cooperative  HENT:  Head: Normocephalic and atraumatic.  Mouth/Throat: Oropharynx is clear and moist.  Eyes: Conjunctivae are normal. Right eye exhibits  no discharge. Left eye exhibits no discharge.  Neck: Normal range of motion. Neck supple. No thyromegaly present.  Cardiovascular: Normal rate, regular rhythm, normal heart sounds and intact distal pulses.   No murmur heard. Pulmonary/Chest: Effort normal and breath sounds normal. No respiratory distress. He has no wheezes. He has no rales.  Genitourinary:  Genitourinary Comments: Deferred DRE today  Musculoskeletal: He exhibits edema (Mild +1 pitting edema bilateral ankles, feet).  Lymphadenopathy:    He has no cervical  adenopathy.  Neurological: He is alert and oriented to person, place, and time.  Skin: Skin is warm and dry. No rash noted. He is not diaphoretic. No erythema.  Psychiatric: He has a normal mood and affect. His behavior is normal.  Nursing note and vitals reviewed.  Results for orders placed or performed in visit on 04/16/16  COMPLETE METABOLIC PANEL WITH GFR  Result Value Ref Range   Sodium 141 135 - 146 mmol/L   Potassium 3.9 3.5 - 5.3 mmol/L   Chloride 104 98 - 110 mmol/L   CO2 27 20 - 31 mmol/L   Glucose, Bld 87 65 - 99 mg/dL   BUN 13 7 - 25 mg/dL   Creat 0.76 0.70 - 1.18 mg/dL   Total Bilirubin 0.6 0.2 - 1.2 mg/dL   Alkaline Phosphatase 56 40 - 115 U/L   AST 13 10 - 35 U/L   ALT 11 9 - 46 U/L   Total Protein 6.3 6.1 - 8.1 g/dL   Albumin 4.0 3.6 - 5.1 g/dL   Calcium 9.2 8.6 - 10.3 mg/dL   GFR, Est African American >89 >=60 mL/min   GFR, Est Non African American 87 >=60 mL/min  CBC with Differential  Result Value Ref Range   WBC 6.8 3.8 - 10.8 K/uL   RBC 4.14 (L) 4.20 - 5.80 MIL/uL   Hemoglobin 12.8 (L) 13.2 - 17.1 g/dL   HCT 38.7 38.5 - 50.0 %   MCV 93.5 80.0 - 100.0 fL   MCH 30.9 27.0 - 33.0 pg   MCHC 33.1 32.0 - 36.0 g/dL   RDW 14.3 11.0 - 15.0 %   Platelets 281 140 - 400 K/uL   MPV 9.8 7.5 - 12.5 fL   Neutro Abs 3,808 1,500 - 7,800 cells/uL   Lymphs Abs 2,312 850 - 3,900 cells/uL   Monocytes Absolute 544 200 - 950 cells/uL   Eosinophils Absolute 136 15 - 500 cells/uL   Basophils Absolute 0 0 - 200 cells/uL   Neutrophils Relative % 56 %   Lymphocytes Relative 34 %   Monocytes Relative 8 %   Eosinophils Relative 2 %   Basophils Relative 0 %   Smear Review Criteria for review not met   Lipid Profile  Result Value Ref Range   Cholesterol 147 <200 mg/dL   Triglycerides 147 <150 mg/dL   HDL 48 >40 mg/dL   Total CHOL/HDL Ratio 3.1 <5.0 Ratio   VLDL 29 <30 mg/dL   LDL Cholesterol 70 <100 mg/dL      Assessment & Plan:   Problem List Items Addressed This Visit     Screening for prostate cancer    Average risk patient with age 65, with prior reported negative screening - Last PSA unavailable, prior values normal - Last DRE years ago reported normal - Clinically symptomatic with LUTS  Plan: 1. Reviewed prostate cancer screening guidelines and risks including potential prostate biopsy if abnormal PSA, proceed with yearly PSA for now, and anticipate DRE as needed especially if  abnormal PSA or new symptoms      Relevant Orders   PSA, Total with Reflex to PSA, Free   Hyperlipidemia    Stable, HLD on statin, last lipids 04/2016 Continue Atorvastatin 70m (sent new rx for 461mpills instead of half tab 80) Continue ASA 812maily for ASCVD risk reduction Follow-up lipids yearly      Relevant Medications   atorvastatin (LIPITOR) 40 MG tablet   Essential hypertension - Primary    Elevated BP SBP >170 initially, on manual repeat check down to [id number] known complications Failed HCTZ (hyponatremia)  Plan: 1. No change to BP meds today - continue Metoprolol 59m81mD (had been increased last time and HR is low normal), Lisinopril 20mg21mly - since starting Flomax and history of hypotension concern first to see if can tolerate 2. Closely watch BP at home, monitor regularly, notify office if elevated BP >140/90 persistently 3. Encouraged will need to work on gradual improving exercise, continue hydration, needs to lower sodium in diet 4. Follow-up 4 weeks HTN - may need cardura in future for BP and BPH if needed      Relevant Medications   atorvastatin (LIPITOR) 40 MG tablet   BPH with obstruction/lower urinary tract symptoms    Stable chronic BPH with LUTS primarily nocturia. Limited management in past - AUA BPH score 24 (severe, no prior score to compare) - No prior therapy (possibly flomax years ago, does not recall) - Last PSA reported normal, not available - Last DRE reported normal - No known personal/family history of prostate  CA  Plan: 1. Start Tamsulosin 0.4mg d87my, advised on benefits, risks, if BP low caution with sudden standing up or position change 2. Check PSA today 3. Follow-up 4 weeks BPH and HTN, consider future doxazosin if need HTN control, may need future referral to Urology if remains uncontrolled      Relevant Orders   PSA, Total with Reflex to PSA, Free   Abnormal glucose    Prior elevated glucose, no prior A1c Check A1c today for diabetes screening      Relevant Orders   Hemoglobin A1c      Meds ordered this encounter  Medications  . atorvastatin (LIPITOR) 40 MG tablet    Sig: Take 1 tablet (40 mg total) by mouth daily.    Dispense:  90 tablet    Refill:  3    Please fill 40mg t4mts, discontinue prior 80mg rx38m Follow up plan: Return in about 4 weeks (around 08/19/2016) for blood pressure.  [facility] 07/22/2016, 9:56 PM

## 2016-07-22 NOTE — Assessment & Plan Note (Signed)
Stable chronic BPH with LUTS primarily nocturia. Limited management in past - AUA BPH score 24 (severe, no prior score to compare) - No prior therapy (possibly flomax years ago, does not recall) - Last PSA reported normal, not available - Last DRE reported normal - No known personal/family history of prostate CA  Plan: 1. Start Tamsulosin 0.4mg  daily, advised on benefits, risks, if BP low caution with sudden standing up or position change 2. Check PSA today 3. Follow-up 4 weeks BPH and HTN, consider future doxazosin if need HTN control, may need future referral to Urology if remains uncontrolled

## 2016-07-22 NOTE — Assessment & Plan Note (Signed)
Elevated BP SBP >170 initially, on manual repeat check down to 160s No known complications Failed HCTZ (hyponatremia)  Plan: 1. No change to BP meds today - continue Metoprolol 50mg  BID (had been increased last time and HR is low normal), Lisinopril 20mg  daily - since starting Flomax and history of hypotension concern first to see if can tolerate 2. Closely watch BP at home, monitor regularly, notify office if elevated BP >140/90 persistently 3. Encouraged will need to work on gradual improving exercise, continue hydration, needs to lower sodium in diet 4. Follow-up 4 weeks HTN - may need cardura in future for BP and BPH if needed

## 2016-07-22 NOTE — Assessment & Plan Note (Signed)
Average risk patient with age 82, with prior reported negative screening - Last PSA unavailable, prior values normal - Last DRE years ago reported normal - Clinically symptomatic with LUTS  Plan: 1. Reviewed prostate cancer screening guidelines and risks including potential prostate biopsy if abnormal PSA, proceed with yearly PSA for now, and anticipate DRE as needed especially if abnormal PSA or new symptoms

## 2016-07-22 NOTE — Assessment & Plan Note (Signed)
Prior elevated glucose, no prior A1c Check A1c today for diabetes screening

## 2016-07-22 NOTE — Assessment & Plan Note (Signed)
Stable, HLD on statin, last lipids 04/2016 Continue Atorvastatin 40mg  (sent new rx for 40mg  pills instead of half tab 80) Continue ASA 81mg  daily for ASCVD risk reduction Follow-up lipids yearly

## 2016-07-23 ENCOUNTER — Telehealth: Payer: Self-pay | Admitting: Family Medicine

## 2016-07-23 LAB — HEMOGLOBIN A1C
HEMOGLOBIN A1C: 5.3 % (ref <5.7)
Mean Plasma Glucose: 105 mg/dL

## 2016-07-23 LAB — PSA, TOTAL WITH REFLEX TO PSA, FREE: PSA, Total: 2.3 ng/mL (ref <=4.0)

## 2016-07-23 NOTE — Telephone Encounter (Signed)
Pt. Wife called states that she was giveing her husband metoprolol 50 mg  2 pills at one time now  ( was only given  1 1/2 in morning and the other 1/2 at night.

## 2016-07-23 NOTE — Telephone Encounter (Signed)
Error

## 2016-07-23 NOTE — Telephone Encounter (Signed)
He should be taking Metoprolol 50mg  (one whole tablet) TWICE a day - one in morning and one in evening.  This is how it was previously prescribed by Dr Juanetta GoslingHawkins in February.  Please notify them of this correct dosing.  Saralyn PilarAlexander Francisca Harbuck, DO [facility] 07/23/2016, 5:39 PM

## 2016-07-24 NOTE — Telephone Encounter (Signed)
Pt's wife advised.

## 2016-08-02 ENCOUNTER — Other Ambulatory Visit: Payer: Self-pay | Admitting: Psychiatry

## 2016-08-19 ENCOUNTER — Encounter: Payer: Self-pay | Admitting: Family Medicine

## 2016-08-19 ENCOUNTER — Ambulatory Visit (INDEPENDENT_AMBULATORY_CARE_PROVIDER_SITE_OTHER): Payer: Medicare Other | Admitting: Family Medicine

## 2016-08-19 VITALS — BP 159/66 | HR 62 | Temp 98.2°F | Resp 16 | Ht 69.0 in | Wt 231.0 lb

## 2016-08-19 DIAGNOSIS — N138 Other obstructive and reflux uropathy: Secondary | ICD-10-CM

## 2016-08-19 DIAGNOSIS — N401 Enlarged prostate with lower urinary tract symptoms: Secondary | ICD-10-CM | POA: Diagnosis not present

## 2016-08-19 DIAGNOSIS — I10 Essential (primary) hypertension: Secondary | ICD-10-CM | POA: Diagnosis not present

## 2016-08-19 MED ORDER — DOXAZOSIN MESYLATE 4 MG PO TABS: 4.0000 mg | ORAL_TABLET | Freq: Every day | ORAL | 2 refills | Status: DC

## 2016-08-19 MED ORDER — LISINOPRIL 20 MG PO TABS: 20.0000 mg | ORAL_TABLET | Freq: Every day | ORAL | 3 refills | Status: DC

## 2016-08-19 NOTE — Patient Instructions (Addendum)
Thank you for coming to the clinic today.  1. Med changes today  STOP Flomax (Tamsulosin)  STart new Doxazosin (Cardura) 2mg  daily which is HALF tablet once daily in morning for next 2 weeks, then if tolerating well. You can increase to 1 WHOLE tablet =4 mg daily.  If you get significant symptoms of LOW blood pressure, lightheaded dizzy almost pass, can stop taking new med, switch back to flomax and call office.  Also if difficulty urinating, less urine output, or hard time with urine stream, with prostate, notify office, and we can change dose to TWICE daily or EXTENDED release  Please schedule a Follow-up Appointment to: Return in about 3 months (around 11/19/2016) for blood pressure.  If you have any other questions or concerns, please feel free to call the clinic or send a message through MyChart. You may also schedule an earlier appointment if necessary.  Additionally, you may be receiving a survey about your experience at our clinic within a few days to 1 week by e-mail or mail. We value your feedback.  Saralyn PilarAlexander Yanin Muhlestein, DO [facility]

## 2016-08-19 NOTE — Assessment & Plan Note (Signed)
Improved but still elevated BP with home checks >150s No known complications Failed HCTZ (hyponatremia)  Plan: 1. Stop Flomax. Start Doxazosin (Cardura) IR 4mg  daily - to start can cut tab in half for 2mg  daily for 1-2 weeks then increase to whole pill after 2 weeks. Counseling on importance of increasing dose if not voiding or concerns of urinary obstruction 2. Continue Metoprolol 50mg  BID, Lisinopril 20mg  daily - since starting  3. Closely watch BP at home, monitor regularly, notify office if elevated BP >140/90 persistently - discussion on goal BP < 150, despite patient not interested in lower BP again as he experienced in past, he is comfortable with lesser control and understands risks 4. Encouraged will need to work on gradual improving exercise, continue hydration, needs to lower sodium in diet 5. Follow-up 3 months HTN - may need titrate cardura up to 8mg  daily

## 2016-08-19 NOTE — Progress Notes (Signed)
Subjective:    Patient ID: Erik Palmer, male    DOB: 11/12/1934, 82 y.o.   MRN: [id number]  Erik Palmer is a 82 y.o. male presenting on 08/19/2016 for Hypertension (pt's B/P ranges from 155/74)  Patient present with his wife, Kriste Basque. They both provide history.  HPI   CHRONIC HTN: Since last visit, home reading 150s/60-70s, had x 1 with lower BP 130s Current Meds - Metoprolol 50mg  BID (previously half tablet for 25mg  BID), Lisinopril 20mg  - Failed HCTZ in past due to hyponatremia Reports good compliance, took meds today. Tolerating well, w/o complaints. Lifestyle: - Exercise: Limited exercise. Still recovering gradually from fall injury 12/2015 with R hip fracture and arm, s/p surgical fixation - Diet: Drinks water, not following any special diet. No strict low sodium diet - Takes ASA 81mg  daily. History of CAD with blockage, s/p PCI with removal - Admits edema in lower extremities, ankles, no longer wearing compression hose Denies CP, dyspnea, HA, edema, dizziness / lightheadedness  BPH with LUTS / Nocturia - Since last visit 6 weeks ago he has tried changing Flomax AM dosing and PM dosing, and stopped as well then realized he needed to continue it for regular voiding. Still admits frequent nocturia - Last PSA 07/22/16, result 2.3, no comparison result - No personal or family history of Prostate Cancer. - Last DRE years ago, was told normal  07/22/16 AUA BPH Symptom Score over past 1 month 1. Sensation of not emptying bladder post void - 3 2. Urinate less than 2 hour after finish last void - 4 3. Start/Stop several times during void - 0 4. Difficult to postpone urination - 1 5. Weak urinary stream - 4 6. Push or strain urination - 2 7. Nocturia - 10 times  Total Score: 24 (Severe BPH symptoms)  Social History  Substance Use Topics  . Smoking status: Never Smoker  . Smokeless tobacco: Never Used  . Alcohol use No    Review of Systems Per HPI unless specifically  indicated above     Objective:    BP (!) 159/66   Pulse 62   Temp 98.2 F (36.8 C) (Oral)   Resp 16   Ht 5\' 9"  (1.753 m)   Wt 231 lb (104.8 kg)   BMI 34.11 kg/m   Wt Readings from Last 3 Encounters:  08/19/16 231 lb (104.8 kg)  07/22/16 228 lb (103.4 kg)  04/16/16 229 lb (103.9 kg)    Physical Exam  Constitutional: He is oriented to person, place, and time. He appears well-developed and well-nourished. No distress.  Well-appearing, comfortable, cooperative  HENT:  Head: Normocephalic and atraumatic.  Mouth/Throat: Oropharynx is clear and moist.  Eyes: Conjunctivae are normal. Right eye exhibits no discharge. Left eye exhibits no discharge.  Neck: Normal range of motion. Neck supple. No thyromegaly present.  Cardiovascular: Normal rate, regular rhythm, normal heart sounds and intact distal pulses.   No murmur heard. Pulmonary/Chest: Effort normal and breath sounds normal. No respiratory distress. He has no wheezes. He has no rales.  Genitourinary:  Genitourinary Comments: Deferred DRE today  Musculoskeletal: He exhibits edema (Stable to improved, mild +1 pitting edema bilateral ankles, feet).  Lymphadenopathy:    He has no cervical adenopathy.  Neurological: He is alert and oriented to person, place, and time.  Skin: Skin is warm and dry. No rash noted. He is not diaphoretic. No erythema.  Psychiatric: He has a normal mood and affect. His behavior is normal.  Nursing note and  vitals reviewed.  Results for orders placed or performed in visit on 07/22/16  Hemoglobin A1c  Result Value Ref Range   Hgb A1c MFr Bld 5.3 <5.7 %   Mean Plasma Glucose 105 mg/dL  PSA, Total with Reflex to PSA, Free  Result Value Ref Range   PSA, Total 2.3 <=4.0 ng/mL      Assessment & Plan:   Problem List Items Addressed This Visit    Essential hypertension - Primary    Improved but still elevated BP with home checks >150s No known complications Failed HCTZ (hyponatremia)  Plan: 1. Stop  Flomax. Start Doxazosin (Cardura) IR 4mg  daily - to start can cut tab in half for 2mg  daily for 1-2 weeks then increase to whole pill after 2 weeks. Counseling on importance of increasing dose if not voiding or concerns of urinary obstruction 2. Continue Metoprolol 50mg  BID, Lisinopril 20mg  daily - since starting  3. Closely watch BP at home, monitor regularly, notify office if elevated BP >140/90 persistently - discussion on goal BP < 150, despite patient not interested in lower BP again as he experienced in past, he is comfortable with lesser control and understands risks 4. Encouraged will need to work on gradual improving exercise, continue hydration, needs to lower sodium in diet 5. Follow-up 3 months HTN - may need titrate cardura up to 8mg  daily      Relevant Medications   doxazosin (CARDURA) 4 MG tablet   lisinopril (PRINIVIL,ZESTRIL) 20 MG tablet   BPH with obstruction/lower urinary tract symptoms    Stable chronic BPH with LUTS primarily nocturia. Limited management in past - AUA BPH score 24 (severe, no prior score to compare) - Last PSA 2/3 (07/2016) - Last DRE reported normal - No known personal/family history of prostate CA  Plan: 1. Stop Flomax. Start Doxazosin (Cardura) IR 4mg  daily - to start can cut tab in half for 2mg  daily for 1-2 weeks then increase to whole pill after 2 weeks. Counseling on importance of increasing dose if not voiding or concerns of urinary obstruction 2. Follow-up 3 months AUA BPH score      Relevant Medications   doxazosin (CARDURA) 4 MG tablet      Meds ordered this encounter  Medications  . doxazosin (CARDURA) 4 MG tablet    Sig: Take 1 tablet (4 mg total) by mouth daily. For first 2 weeks cut pill in half for dose 2mg  daily, then increase to whole pill after 2 weeks    Dispense:  30 tablet    Refill:  2  . lisinopril (PRINIVIL,ZESTRIL) 20 MG tablet    Sig: Take 1 tablet (20 mg total) by mouth daily.    Dispense:  90 tablet    Refill:  3     Follow up plan: Return in about 3 months (around 11/19/2016) for blood pressure.  Saralyn PilarAlexander Julann Mcgilvray, DO [facility] 08/19/2016, 11:34 PM

## 2016-08-19 NOTE — Assessment & Plan Note (Addendum)
Stable chronic BPH with LUTS primarily nocturia. Limited management in past - AUA BPH score 24 (severe, no prior score to compare) - Last PSA 2/3 (07/2016) - Last DRE reported normal - No known personal/family history of prostate CA  Plan: 1. Stop Flomax. Start Doxazosin (Cardura) IR 4mg  daily - to start can cut tab in half for 2mg  daily for 1-2 weeks then increase to whole pill after 2 weeks. Counseling on importance of increasing dose if not voiding or concerns of urinary obstruction 2. Follow-up 3 months AUA BPH score

## 2016-08-26 ENCOUNTER — Other Ambulatory Visit: Payer: Self-pay

## 2016-08-26 MED ORDER — FLUTICASONE PROPIONATE 50 MCG/ACT NA SUSP: NASAL | 5 refills | Status: DC

## 2016-09-09 ENCOUNTER — Telehealth: Payer: Self-pay | Admitting: Family Medicine

## 2016-09-09 NOTE — Telephone Encounter (Signed)
Acknowledged update.  Last seen in office on 08/19/16, he was prescribed Doxazosin 4mg  tablets, and started by cutting them in half for dose of 2mg  daily.  His BP readings below are improved and appropriate range.  It sounds like he is voiding okay on the HALF tablet at this time. We can continue with half tablet daily for now. At next visit we can review prostate symptoms, and if he is have difficulty peeing still then that is the reason for increasing to whole tablet instead.  --------------------------------------- Please notify patient that I reviewed the update, and that he may continue HALF tablet of Doxazosin (dose = 2 mg once daily) for now until next visit, he should already have 2 refills on the higher dose 4mg  that he is cutting in half, if they are going to run out of medication then they should call office and request refill of HALF DOSE. Otherwise, we will re-order medication at next office visit.  Erik PilarAlexander Karamalegos, DO [facility] 09/09/2016, 3:57 PM

## 2016-09-09 NOTE — Telephone Encounter (Signed)
Pt.wife called states that pt was still taking 1/2 pill of doxazosin. Pt tried taking a hold  Pil itt was causing him to pee a lot so pt went back to 1/2 of a pill. Wife states the pt BP is   124/69,121/67,120/61,126/66. Pt  Call back  #is [phone]

## 2016-09-10 NOTE — Telephone Encounter (Signed)
Pt advised.

## 2016-09-25 ENCOUNTER — Other Ambulatory Visit: Payer: Self-pay

## 2016-09-25 DIAGNOSIS — N138 Other obstructive and reflux uropathy: Secondary | ICD-10-CM

## 2016-09-25 DIAGNOSIS — I10 Essential (primary) hypertension: Secondary | ICD-10-CM

## 2016-09-25 DIAGNOSIS — N401 Enlarged prostate with lower urinary tract symptoms: Secondary | ICD-10-CM

## 2016-09-25 MED ORDER — DOXAZOSIN MESYLATE 2 MG PO TABS: 2.0000 mg | ORAL_TABLET | Freq: Every day | ORAL | 1 refills | Status: DC

## 2016-10-13 ENCOUNTER — Encounter: Payer: Self-pay | Admitting: Psychiatry

## 2016-10-13 ENCOUNTER — Ambulatory Visit (INDEPENDENT_AMBULATORY_CARE_PROVIDER_SITE_OTHER): Payer: 59 | Admitting: Psychiatry

## 2016-10-13 VITALS — BP 173/79 | HR 68 | Temp 98.0°F | Wt 235.0 lb

## 2016-10-13 DIAGNOSIS — F330 Major depressive disorder, recurrent, mild: Secondary | ICD-10-CM | POA: Diagnosis not present

## 2016-10-13 DIAGNOSIS — F411 Generalized anxiety disorder: Secondary | ICD-10-CM

## 2016-10-13 MED ORDER — QUETIAPINE FUMARATE 50 MG PO TABS: 50.0000 mg | ORAL_TABLET | Freq: Every day | ORAL | 1 refills | Status: DC

## 2016-10-13 NOTE — Progress Notes (Signed)
Psychiatric MD/NP Follow up Note  Patient Identification: Erik Palmer MRN:  [id number] Date of Evaluation:  10/13/2016 Referral Source: [facility] Chief Complaint:   Chief Complaint    Follow-up; Medication Refill     Visit Diagnosis:    ICD-10-CM   1. MDD (major depressive disorder), recurrent episode, mild (HCC) F33.0   2. GAD (generalized anxiety disorder) F41.1    Diagnosis:   Patient Active Problem List   Diagnosis Date Noted  . BPH with obstruction/lower urinary tract symptoms [N40.1, N13.8] 07/22/2016  . Screening for prostate cancer [Z12.5] 07/22/2016  . Abnormal glucose [R73.09] 07/22/2016  . SIADH (syndrome of inappropriate ADH production) (HCC) [E22.2] 01/08/2016  . Malnutrition of moderate degree [E44.0] 12/29/2015  . Insomnia [G47.00] 12/28/2015  . Fracture of unspecified part of neck of right femur, subsequent encounter for closed fracture with routine healing [S72.001D] 12/27/2015  . Closed right hip fracture, initial encounter (HCC) [S72.001A] 12/04/2015  . Hyperlipidemia [E78.5] 05/08/2015  . Essential hypertension [I10] 02/13/2015  . Morbid obesity (HCC) [E66.01] 12/22/2014  . Acute anxiety [F41.9] 09/12/2014  . Grief at loss of child [F43.21, Z63.4] 09/03/2014  . Anxiety disorder due to general medical condition [F06.4] 09/03/2014  . Hyponatremia [E87.1] 09/02/2014   History of Present Illness:    Patient is a 82 -year-old male who presented with his wife for follow up. He comes for appointment every 6 months.Patient remains pleasant and cooperative during the interview. He was asking me if I have gone for vacation and went to the beach. He reported that he has couple of the trips during the summer. He was talking about them in detail. He was calm and alert during the interview. Wife reported that he has been compliant with his medication and  takes Seroquel at night. He has been sleeping well with the medication. He was talking about his new  primary care physician. He reported that he likes him. He does not have any acute issues at this time. Compliant with his medications and wife has been helping him with the same.    He currently denied having any suicidal homicidal ideations or plans.    His mood symptoms are under control and he appears calm and collective during the interview.      Past Medical History:  Past Medical History:  Diagnosis Date  . Angina pectoris syndrome (HCC)   . 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 Arthritis   . GERD (gastroesophageal reflux disease)   . Grief at loss of child 09/03/2014  . Hypercholesteremia   . Hypertension   . Prostate enlargement     Past Surgical History:  Procedure Laterality Date  . TOTAL HIP ARTHROPLASTY Right 12/05/2015   Procedure: TOTAL HIP ARTHROPLASTY ANTERIOR APPROACH;  Surgeon: Kennedy Bucker, MD;  Location: [facility] ORS;  Service: Orthopedics;  Laterality: Right;   Family History:  Family History  Problem Relation Age of Onset  . Hypertension Mother   . Heart block Mother   . Stroke Mother   . Depression Mother   . Cancer Father   . Heart attack Sister   . COPD Sister   . Hypertension Brother   . Hypertension Brother   . Hypertension Brother   . Hypertension Brother   . Heart attack Brother   . Diabetes Neg Hx    Social History:   Social History   Social History  . Marital status: Married    Spouse name: N/A  . Number of children:  N/A  . Years of education: N/A   Social History Main Topics  . Smoking status: Never Smoker  . Smokeless tobacco: Never Used  . Alcohol use No  . Drug use: No  . Sexual activity: Not Currently   Other Topics Concern  . None   Social History Narrative  . None   Additional Social History:   Currently married and lives with his wife. He had 3 children and one of them passed away. She was in [employer] and is currently retired.  Musculoskeletal: Strength & Muscle Tone: within normal  limits Gait & Station: normal Patient leans: Right  Psychiatric Specialty Exam: Medication Refill     Review of Systems  Psychiatric/Behavioral: Positive for depression. The patient is nervous/anxious and has insomnia.   All other systems reviewed and are negative.   Blood pressure (!) 173/79, pulse 68, temperature 98 F (36.7 C), temperature source Oral, weight 235 lb (106.6 kg).Body mass index is 34.7 kg/m.  General Appearance: Casual and Fairly Groomed  Eye Contact:  Fair  Speech:  Clear and Coherent and Normal Rate  Volume:  Normal  Mood:  Euthymic  Affect:  Congruent  Thought Process:  Coherent  Orientation:  Full (Time, Place, and Person)  Thought Content:  WDL  Suicidal Thoughts:  No  Homicidal Thoughts:  No  Memory:  Immediate;   Fair  Judgement:  Fair  Insight:  Fair  Psychomotor Activity:  Normal  Concentration:  Fair  Recall:  [employer] of Knowledge:Fair  Language: Fair  Akathisia:  No  Handed:  Right  AIMS (if indicated):    Assets:  Communication Skills Desire for Improvement Housing Intimacy Physical Health Social Support  ADL's:  Intact  Cognition: WNL  Sleep:  normal     Allergies:   Allergies  Allergen Reactions  . Solu-Medrol [Methylprednisolone Acetate] Anxiety   Current Medications: Current Outpatient Prescriptions  Medication Sig Dispense Refill  . acetaminophen (TYLENOL) 650 MG CR tablet Take 650 mg by mouth daily. Reported on 05/08/2015    . aspirin EC 81 MG tablet Take 81 mg by mouth every morning.    [name] atorvastatin (LIPITOR) 40 MG tablet Take 1 tablet (40 mg total) by mouth daily. 90 tablet 3  . cholecalciferol (VITAMIN D) 400 units TABS tablet Take 400 Units by mouth daily.    [name] docusate sodium (COLACE) 100 MG capsule Take 1 capsule (100 mg total) by mouth 2 (two) times daily. 60 capsule 12  . doxazosin (CARDURA) 2 MG tablet Take 1 tablet (2 mg total) by mouth daily. 90 tablet 1  . fluticasone (FLONASE) 50 MCG/ACT nasal spray  INHALE 2 SPRAYS INTO EACH NOSTRIL EVERY DAY 16 g 5  . lisinopril (PRINIVIL,ZESTRIL) 20 MG tablet Take 1 tablet (20 mg total) by mouth daily. 90 tablet 3  . metoprolol (LOPRESSOR) 50 MG tablet Take 1 tablet twice a day 180 tablet 3  . omeprazole (PRILOSEC) 20 MG capsule TAKE ONE CAPSULE BY MOUTH TWICE A DAY 60 capsule 6  . polyethylene glycol (MIRALAX / GLYCOLAX) packet Take 17 g by mouth daily as needed for mild constipation. 14 each 0  . QUEtiapine (SEROQUEL) 50 MG tablet Take 1 tablet (50 mg total) by mouth at bedtime. 90 tablet 1   No current facility-administered medications for this visit.     Previous Psychotropic Medications: Received Soulmedrol in 2014 led  to his anxiety symptoms Valium and Prozac -2014 by Dr Juanetta Gosling  Prozac again in this summer Celexa prescribed  by Dr Juanetta GoslingHawkins September 04 2014- anxiety and depression.  Insomnia and takes Meclizine, Xanax and Motrin PM to help him sleep.  Dr Juanetta GoslingHawkins started Nortriptyline for sleep, did not help.   Substance Abuse History in the last 12 months:  No.  Consequences of Substance Abuse: Negative NA  Medical Decision Making:  Review of Psycho-Social Stressors (1) and Review and summation of old records (2)  Treatment Plan Summary: Medication management   Discussed with patient what the medications and he will continue on the following medications  Mood symptoms/ sleep Continue Seroquel 50 mg by mouth daily at bedtime and was given 90 day supply of the medications Discussed with him in detail about the medication dose benefits and alternatives.   Follow-up in 6  month or earlier    This note was generated in part or whole with voice recognition software. Voice regonition is usually quite accurate but there are transcription errors that can and very often do occur. I apologize for any typographical errors that were not detected and corrected.    Brandy HaleUzma Aniyia Rane, MD

## 2016-11-18 ENCOUNTER — Telehealth: Payer: Self-pay | Admitting: Family Medicine

## 2016-11-18 NOTE — Telephone Encounter (Signed)
Called pt to schedule for Annual Wellness Visit with Nurse Health Advisor, Tiffany Hill, my c/b # is [phone]  Erik Palmer ° °

## 2016-11-25 ENCOUNTER — Encounter: Payer: Self-pay | Admitting: Family Medicine

## 2016-11-25 ENCOUNTER — Ambulatory Visit (INDEPENDENT_AMBULATORY_CARE_PROVIDER_SITE_OTHER): Payer: Medicare Other | Admitting: Family Medicine

## 2016-11-25 ENCOUNTER — Other Ambulatory Visit: Payer: Self-pay | Admitting: Family Medicine

## 2016-11-25 VITALS — BP 140/66 | HR 63 | Temp 97.8°F | Resp 16 | Ht 69.0 in | Wt 235.6 lb

## 2016-11-25 DIAGNOSIS — N401 Enlarged prostate with lower urinary tract symptoms: Secondary | ICD-10-CM | POA: Diagnosis not present

## 2016-11-25 DIAGNOSIS — R7309 Other abnormal glucose: Secondary | ICD-10-CM

## 2016-11-25 DIAGNOSIS — D649 Anemia, unspecified: Secondary | ICD-10-CM | POA: Insufficient documentation

## 2016-11-25 DIAGNOSIS — Z125 Encounter for screening for malignant neoplasm of prostate: Secondary | ICD-10-CM

## 2016-11-25 DIAGNOSIS — N138 Other obstructive and reflux uropathy: Secondary | ICD-10-CM

## 2016-11-25 DIAGNOSIS — D509 Iron deficiency anemia, unspecified: Secondary | ICD-10-CM

## 2016-11-25 DIAGNOSIS — E782 Mixed hyperlipidemia: Secondary | ICD-10-CM

## 2016-11-25 DIAGNOSIS — I10 Essential (primary) hypertension: Secondary | ICD-10-CM

## 2016-11-25 MED ORDER — DOXAZOSIN MESYLATE 2 MG PO TABS: 2.0000 mg | ORAL_TABLET | Freq: Two times a day (BID) | ORAL | 3 refills | Status: DC

## 2016-11-25 NOTE — Patient Instructions (Addendum)
Thank you for coming to the clinic today.  1.  Keep checking BP at home, keep track of readings, if persistently >140 to 145, then may notify office sooner, may need to adjust medication, or return  COntinue current dosing BP meds - including Doxazosin  TWICE daily, new rx sent  DUE for FASTING BLOOD WORK (no food or drink after midnight before the lab appointment, only water or coffee without cream/sugar on the morning of)  SCHEDULE "Lab Only" visit in the morning at the clinic for lab draw in 8 months  - Make sure Lab Only appointment is at about 1 week before your next appointment, so that results will be available  For Lab Results, once available within 2-3 days of blood draw, you can can log in to MyChart online to view your results and a brief explanation. Also, we can discuss results at next follow-up visit.   Please schedule a Follow-up Appointment to: Return in about 8 months (around 07/25/2017) for Medicare Physical CPE.  If you have any other questions or concerns, please feel free to call the clinic or send a message through MyChart. You may also schedule an earlier appointment if necessary.  Additionally, you may be receiving a survey about your experience at our clinic within a few days to 1 week by e-mail or mail. We value your feedback.  Saralyn Pilar, DO [facility], [place]

## 2016-11-25 NOTE — Progress Notes (Signed)
Subjective:    Patient ID: Erik Palmer, male    DOB: [birth year], 82 y.o.   MRN: [id number]  Erik Palmer is a 82 y.o. male presenting on 11/25/2016 for Hypertension   HPI   CHRONIC HTN: - Last visit with me 08/19/16, for HTN, treated with new start Doxazosin, see below for BPH and BP, see prior notes for background information. - Today patient reports BP seems improved on new med, his dose was adjusted, see below. Home BP cuff readings avg 130-140s, instead of previous readings 150s or higher Current Meds - Metoprolol  BID, Lisinopril , Doxazosin  BID - Failed HCTZ in past due to hyponatremia Reports good compliance, took meds today. Tolerating well, w/o complaints. Lifestyle: - Exercise: Limited exercise, due to history of prior injuries - Diet: Drinks water, not following any special diet. No strict low sodium diet - Takes ASA  daily. History of CAD with blockage, s/p PCI with removal - Admits edema in lower extremities, unchanged Denies CP, dyspnea, HA, edema, dizziness / lightheadedness  PMH Morbid Obesity  BMI >34  BPH with LUTS / Nocturia - Last visit with me 08/19/16, for same problem, treated with new start Doxaxosin trial instead of Flomax in past, see prior notes for background information. - Interval update with did not tolerate Doxazosin  daily, reduced back 2 mg and split dose for  BID with better results - Today patient reports feels overall urination is improved, but does give mixed results on AUA BPH scoring symptoms below, seems that he has a wide range of symptoms some days good others bad, and often "out of his control" - Last PSA 07/22/16, result 2.3, no comparison result - No personal or family history of Prostate Cancer. - brother had problem with prostate, had biopsy - Last DRE years ago, was told normal  11/25/16 AUA BPH Symptom Score over past 1 month 1. Sensation of not emptying bladder post void - 3 2. Urinate less than 2 hour  after finish last void - 2 3. Start/Stop several times during void - 0 4. Difficult to postpone urination - 1 5. Weak urinary stream - 5 6. Push or strain urination - 2 7. Nocturia - 8 times  Total Score: 21 (Severe BPH symptoms)  Previous scores: - 08/19/16 - 24, on Flomax  Health Maintenance: - Declines Flu Shot today, discussed risk and benefit, not interested  Depression screen [facility] 2/9 11/25/2016 07/22/2016 05/08/2015  Decreased Interest 0 0 0  Down, Depressed, Hopeless 0 0 0  PHQ - 2 Score 0 0 0  Altered sleeping 0 - -  Tired, decreased energy 0 - -  Change in appetite 0 - -  Feeling bad or failure about yourself  0 - -  Trouble concentrating 0 - -  Moving slowly or fidgety/restless 0 - -  Suicidal thoughts 0 - -  PHQ-9 Score 0 - -  Difficult doing work/chores Not difficult at all - -    Social History  Substance Use Topics  . Smoking status: Never Smoker  . Smokeless tobacco: Never Used  . Alcohol use No    Review of Systems Per HPI unless specifically indicated above     Objective:    BP 140/66 (BP Location: Left Arm, Cuff Size: Normal)   Pulse 63   Temp 97.8 F (36.6 C) (Oral)   Resp 16   Ht  (1.753 m)   Wt 235 lb 9.6 oz (106.9 kg)   BMI 34.79 kg/m  Wt Readings from Last 3 Encounters:  11/25/16 235 lb 9.6 oz (106.9 kg)  08/19/16 231 lb (104.8 kg)  07/22/16 228 lb (103.4 kg)    Physical Exam  Constitutional: He is oriented to person, place, and time. He appears well-developed and well-nourished. No distress.  Well-appearing, comfortable, cooperative  HENT:  Head: Normocephalic and atraumatic.  Mouth/Throat: Oropharynx is clear and moist.  Eyes: Conjunctivae are normal. Right eye exhibits no discharge. Left eye exhibits no discharge.  Neck: Normal range of motion. Neck supple. No thyromegaly present.  Cardiovascular: Normal rate, regular rhythm, normal heart sounds and intact distal pulses.   No murmur heard. Pulmonary/Chest: Effort normal  and breath sounds normal. No respiratory distress. He has no wheezes. He has no rales.  Genitourinary:  Genitourinary Comments: Deferred DRE today  Musculoskeletal: He exhibits edema (Stable to improved, mild trace to +1 pitting edema bilateral ankles, feet).  Lymphadenopathy:    He has no cervical adenopathy.  Neurological: He is alert and oriented to person, place, and time.  Skin: Skin is warm and dry. No rash noted. He is not diaphoretic. No erythema.  Psychiatric: He has a normal mood and affect. His behavior is normal.  Nursing note and vitals reviewed.  Results for orders placed or performed in visit on 07/22/16  Hemoglobin A1c  Result Value Ref Range   Hgb A1c MFr Bld 5.3 <5.7 %   Mean Plasma Glucose 105 mg/dL  PSA, Total with Reflex to PSA, Free  Result Value Ref Range   PSA, Total 2.3 <=4.0 ng/mL      Assessment & Plan:   Problem List Items Addressed This Visit    BPH with obstruction/lower urinary tract symptoms    Stable to mildly improved chronic BPH with LUTS primarily nocturia. Limited management in past - AUA BPH score 21 (still severe, but improved from last 24 on flomax - Last PSA 2/3 (07/2016) - Last DRE reported normal - No known personal/family history of prostate CA - fam history of brother with prostate biopsy/BPH  Plan: 1. Continue current dose Doxazosin  BID for now - cannot tolerate higher dose up to  per 2. Discussed limited other options, may need Finasteride in future in combination, would re-check PSA first before considering this, next due 07/2017, also consider Urology if not improved and bothering patient but does not seem to bother him too much, as high symptom score would indicate 3. Follow-up 6-8 months Annual + labs + AUA BPH score      Relevant Medications   doxazosin (CARDURA) 2 MG tablet   Essential hypertension    Improved but still elevated BP with home checks now 130-140 overall better control No known complications Failed HCTZ  (hyponatremia)  Plan: 1. Continue Metoprolol  BID, Lisinopril  daily, Doxazosin  BID - refilled (unable to tolerate higher dose Doxazosin) 3. Closely watch BP at home, monitor regularly, notify office if elevated BP >140/90 persistently - discussion on goal BP < 150, despite patient not interested in lower BP again as he experienced in past, he is comfortable with lesser control and understands risks 4. Encouraged will need to work on gradual improving exercise, continue hydration, needs to lower sodium in diet 5. Follow-up 6-8 months for Annual + Labs, offered closer follow-up, but declined since not interested in other med change at this time      Relevant Medications   doxazosin (CARDURA) 2 MG tablet   Morbid obesity (HCC) - Primary    Weight gain now +7  lbs in 4 months Limited lifestyle due to poor exercise Trying to improve diet, counseling provided         Meds ordered this encounter  Medications  . DISCONTD: doxazosin (CARDURA) 4 MG tablet  . doxazosin (CARDURA) 2 MG tablet    Sig: Take 1 tablet (2 mg total) by mouth 2 (two) times daily.    Dispense:  180 tablet    Refill:  3    Follow up plan: Return in about 8 months (around 07/25/2017) for Medicare Physical CPE.  Saralyn Pilar, DO [facility] 11/25/2016, 10:32 AM

## 2016-11-25 NOTE — Assessment & Plan Note (Signed)
Weight gain now +7 lbs in 4 months Limited lifestyle due to poor exercise Trying to improve diet, counseling provided

## 2016-11-25 NOTE — Assessment & Plan Note (Signed)
Stable to mildly improved chronic BPH with LUTS primarily nocturia. Limited management in past - AUA BPH score 21 (still severe, but improved from last 24 on flomax - Last PSA 2/3 (07/2016) - Last DRE reported normal - No known personal/family history of prostate CA - fam history of brother with prostate biopsy/BPH  Plan: 1. Continue current dose Doxazosin  BID for now - cannot tolerate higher dose up to  per 2. Discussed limited other options, may need Finasteride in future in combination, would re-check PSA first before considering this, next due 07/2017, also consider Urology if not improved and bothering patient but does not seem to bother him too much, as high symptom score would indicate 3. Follow-up 6-8 months Annual + labs + AUA BPH score

## 2016-11-25 NOTE — Assessment & Plan Note (Signed)
Improved but still elevated BP with home checks now 130-140 overall better control No known complications Failed HCTZ (hyponatremia)  Plan: 1. Continue Metoprolol  BID, Lisinopril  daily, Doxazosin  BID - refilled (unable to tolerate higher dose Doxazosin) 3. Closely watch BP at home, monitor regularly, notify office if elevated BP >140/90 persistently - discussion on goal BP < 150, despite patient not interested in lower BP again as he experienced in past, he is comfortable with lesser control and understands risks 4. Encouraged will need to work on gradual improving exercise, continue hydration, needs to lower sodium in diet 5. Follow-up 6-8 months for Annual + Labs, offered closer follow-up, but declined since not interested in other med change at this time

## 2016-11-27 ENCOUNTER — Other Ambulatory Visit: Payer: Self-pay

## 2016-11-27 MED ORDER — OMEPRAZOLE 20 MG PO CPDR: 20.0000 mg | DELAYED_RELEASE_CAPSULE | Freq: Two times a day (BID) | ORAL | 6 refills | Status: DC

## 2017-02-03 ENCOUNTER — Other Ambulatory Visit: Payer: Self-pay | Admitting: Family Medicine

## 2017-02-03 MED ORDER — FLUTICASONE PROPIONATE 50 MCG/ACT NA SUSP: NASAL | 5 refills | Status: DC

## 2017-03-18 ENCOUNTER — Telehealth: Payer: Self-pay | Admitting: Family Medicine

## 2017-03-18 NOTE — Telephone Encounter (Signed)
Pt's wife wanted to let Dr. Kirtland BouchardK know [facility]  Her call back number is [phone]

## 2017-03-18 NOTE — Telephone Encounter (Signed)
Acknowledged update. Would you be able to call back to check if there was anything in particular that they needed from our office? Otherwise, we can follow-up in the future on this after surgery.  Saralyn PilarAlexander Navika Hoopes, DO [facility] 03/18/2017, 11:52 AM

## 2017-03-18 NOTE — Telephone Encounter (Signed)
Left message for patient to call back  

## 2017-03-19 NOTE — Telephone Encounter (Signed)
Spoke to the wife patient had 4 bypass surgeries done and [facility] advised her pas per Dr. Kirtland BouchardK doesn't need any paperwork from us at the moment but appreciated calling.

## 2017-03-25 HISTORY — PX: CORONARY ARTERY BYPASS GRAFT: SHX141

## 2017-03-26 ENCOUNTER — Emergency Department: Payer: Medicare Other

## 2017-03-26 ENCOUNTER — Telehealth: Payer: Self-pay

## 2017-03-26 ENCOUNTER — Ambulatory Visit (HOSPITAL_COMMUNITY)
Admission: AD | Admit: 2017-03-26 | Discharge: 2017-03-26 | Disposition: A | Discharge status: Home / Self Care | Payer: Medicare Other | Source: Other Acute Inpatient Hospital | Attending: Student in an Organized Health Care Education/Training Program | Admitting: Student in an Organized Health Care Education/Training Program

## 2017-03-26 ENCOUNTER — Emergency Department
Admission: EM | Admit: 2017-03-26 | Discharge: 2017-03-27 | Disposition: A | Discharge status: Other Acute Inpatient Hospital | Payer: Medicare Other | Attending: Student in an Organized Health Care Education/Training Program | Admitting: Student in an Organized Health Care Education/Training Program

## 2017-03-26 ENCOUNTER — Other Ambulatory Visit: Payer: Self-pay

## 2017-03-26 ENCOUNTER — Encounter: Payer: Self-pay | Admitting: *Deleted

## 2017-03-26 DIAGNOSIS — Z79899 Other long term (current) drug therapy: Secondary | ICD-10-CM | POA: Diagnosis not present

## 2017-03-26 DIAGNOSIS — Z96641 Presence of right artificial hip joint: Secondary | ICD-10-CM | POA: Diagnosis not present

## 2017-03-26 DIAGNOSIS — I10 Essential (primary) hypertension: Secondary | ICD-10-CM | POA: Insufficient documentation

## 2017-03-26 DIAGNOSIS — Z7982 Long term (current) use of aspirin: Secondary | ICD-10-CM | POA: Insufficient documentation

## 2017-03-26 DIAGNOSIS — Y839 Surgical procedure, unspecified as the cause of abnormal reaction of the patient, or of later complication, without mention of misadventure at the time of the procedure: Secondary | ICD-10-CM | POA: Insufficient documentation

## 2017-03-26 DIAGNOSIS — N390 Urinary tract infection, site not specified: Secondary | ICD-10-CM

## 2017-03-26 DIAGNOSIS — Z951 Presence of aortocoronary bypass graft: Secondary | ICD-10-CM

## 2017-03-26 DIAGNOSIS — A419 Sepsis, unspecified organism: Secondary | ICD-10-CM

## 2017-03-26 DIAGNOSIS — Z9889 Other specified postprocedural states: Secondary | ICD-10-CM | POA: Diagnosis not present

## 2017-03-26 DIAGNOSIS — T819XXA Unspecified complication of procedure, initial encounter: Secondary | ICD-10-CM | POA: Insufficient documentation

## 2017-03-26 DIAGNOSIS — R509 Fever, unspecified: Secondary | ICD-10-CM | POA: Diagnosis not present

## 2017-03-26 DIAGNOSIS — J9811 Atelectasis: Secondary | ICD-10-CM | POA: Diagnosis not present

## 2017-03-26 LAB — URINALYSIS, COMPLETE (UACMP) WITH MICROSCOPIC
Bilirubin Urine: NEGATIVE
Glucose, UA: NEGATIVE mg/dL
Hgb urine dipstick: NEGATIVE
Ketones, ur: 20 mg/dL — AB
Nitrite: POSITIVE — AB
Protein, ur: 30 mg/dL — AB
SPECIFIC GRAVITY, URINE: 1.028 (ref 1.005–1.030)
pH: 6 (ref 5.0–8.0)

## 2017-03-26 LAB — COMPREHENSIVE METABOLIC PANEL
ALK PHOS: 78 U/L (ref 38–126)
ALT: 20 U/L (ref 17–63)
ANION GAP: 10 (ref 5–15)
AST: 24 U/L (ref 15–41)
Albumin: 3.5 g/dL (ref 3.5–5.0)
BILIRUBIN TOTAL: 0.7 mg/dL (ref 0.3–1.2)
BUN: 12 mg/dL (ref 6–20)
CALCIUM: 8.4 mg/dL — AB (ref 8.9–10.3)
CO2: 25 mmol/L (ref 22–32)
Chloride: 99 mmol/L — ABNORMAL LOW (ref 101–111)
Creatinine, Ser: 0.93 mg/dL (ref 0.61–1.24)
Glucose, Bld: 120 mg/dL — ABNORMAL HIGH (ref 65–99)
Potassium: 3.8 mmol/L (ref 3.5–5.1)
Sodium: 134 mmol/L — ABNORMAL LOW (ref 135–145)
TOTAL PROTEIN: 6.6 g/dL (ref 6.5–8.1)

## 2017-03-26 LAB — CBC WITH DIFFERENTIAL/PLATELET
BASOS PCT: 1 %
Basophils Absolute: 0.2 10*3/uL — ABNORMAL HIGH (ref 0–0.1)
EOS ABS: 0.2 10*3/uL (ref 0–0.7)
Eosinophils Relative: 1 %
HCT: 32.8 % — ABNORMAL LOW (ref 40.0–52.0)
HEMOGLOBIN: 10.9 g/dL — AB (ref 13.0–18.0)
Lymphocytes Relative: 5 %
Lymphs Abs: 1.1 10*3/uL (ref 1.0–3.6)
MCH: 31.5 pg (ref 26.0–34.0)
MCHC: 33.3 g/dL (ref 32.0–36.0)
MCV: 94.6 fL (ref 80.0–100.0)
Monocytes Absolute: 0.7 10*3/uL (ref 0.2–1.0)
Monocytes Relative: 3 %
NEUTROS PCT: 90 %
Neutro Abs: 21.6 10*3/uL — ABNORMAL HIGH (ref 1.4–6.5)
Platelets: 547 10*3/uL — ABNORMAL HIGH (ref 150–440)
RBC: 3.47 MIL/uL — ABNORMAL LOW (ref 4.40–5.90)
RDW: 13.2 % (ref 11.5–14.5)
WBC: 23.8 10*3/uL — AB (ref 3.8–10.6)

## 2017-03-26 LAB — PROTIME-INR
INR: 1.37
PROTHROMBIN TIME: 16.8 s — AB (ref 11.4–15.2)

## 2017-03-26 LAB — LACTIC ACID, PLASMA: Lactic Acid, Venous: 1 mmol/L (ref 0.5–1.9)

## 2017-03-26 MED ORDER — QUETIAPINE FUMARATE 25 MG PO TABS
50.0000 mg | ORAL_TABLET | Freq: Every day | ORAL | Status: AC
  Administered 2017-03-26: 50 mg via ORAL
  Filled 2017-03-26: qty 2

## 2017-03-26 MED ORDER — CEFEPIME HCL 2 G IJ SOLR
2.0000 g | Freq: Once | INTRAMUSCULAR | Status: AC
  Administered 2017-03-26: 2 g via INTRAVENOUS
  Filled 2017-03-26: qty 2

## 2017-03-26 MED ORDER — SODIUM CHLORIDE 0.9 % IV BOLUS (SEPSIS)
1000.0000 mL | Freq: Once | INTRAVENOUS | Status: AC
  Administered 2017-03-26: 1000 mL via INTRAVENOUS

## 2017-03-26 NOTE — Telephone Encounter (Signed)
pt wife called states that [facility] and she doesnt know when he will be able to come in pt wife was told that appt was 04-13-17 to leave for right now and then cancel or r/s closer to date. But a message would be sent to dr. Garnetta Buddyfaheem to let her know.

## 2017-03-26 NOTE — ED Triage Notes (Signed)
Pt presents post-operative from CABG X 4-5, PT UNSURE. [facility]. Surgery performed on 1/.4/19 and 03/1017. Pt denies pain at this time. Pt denies SOB. Pt is febrile upon arrival. Pt also c/o constipation w/ no BM x 6 daya, sespite bowel regimen.

## 2017-03-26 NOTE — ED Notes (Signed)
Pt has incision closed w/ staples to sternum post-op. Incision is clean and dry, no redness or signs of infection present at incision. No dehiscence noted.

## 2017-03-26 NOTE — ED Provider Notes (Signed)
[facility] Emergency Department Provider Note    First MD Initiated Contact with Patient 03/26/17 1954     (approximate)  I have reviewed the triage vital signs and the nursing notes.   HISTORY  Chief Complaint Post-op Problem and Fever    HPI Erik Palmer is a 83 y.o. male with recent admission to the [facility] status post CABG the second week of this month just discharged yesterday presents to the ER with chills increased urinary frequency and fever to 101.4 despite taking Tylenol.  Denies any nausea or vomiting.  No chest pain.  No cough.  States he has not noticed any swelling drainage or redness around the sternotomy site.  Is not moved his bowels since being discharged home.  States he has been having increased urinary frequency but denies any burning.  Past Medical History:  Diagnosis Date  . Angina pectoris syndrome (HCC)   . 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 Arthritis   . GERD (gastroesophageal reflux disease)   . Grief at loss of child 09/03/2014  . Hypercholesteremia   . Hypertension   . Prostate enlargement    Family History  Problem Relation Age of Onset  . Hypertension Mother   . Heart block Mother   . Stroke Mother   . Depression Mother   . Cancer Father   . Heart attack Sister   . COPD Sister   . Hypertension Brother   . Hypertension Brother   . Hypertension Brother   . Hypertension Brother   . Heart attack Brother   . Diabetes Neg Hx    Past Surgical History:  Procedure Laterality Date  . TOTAL HIP ARTHROPLASTY Right 12/05/2015   Procedure: TOTAL HIP ARTHROPLASTY ANTERIOR APPROACH;  Surgeon: Kennedy BuckerMichael Menz, MD;  Location: [facility] ORS;  Service: Orthopedics;  Laterality: Right;   Patient Active Problem List   Diagnosis Date Noted  . Anemia 11/25/2016  . BPH with obstruction/lower urinary tract symptoms 07/22/2016  . Screening for prostate cancer 07/22/2016  . Abnormal glucose 07/22/2016  . SIADH  (syndrome of inappropriate ADH production) (HCC) 01/08/2016  . Insomnia 12/28/2015  . Fracture of unspecified part of neck of right femur, subsequent encounter for closed fracture with routine healing 12/27/2015  . Closed right hip fracture, initial encounter (HCC) 12/04/2015  . Hyperlipidemia 05/08/2015  . Essential hypertension 02/13/2015  . Morbid obesity (HCC) 12/22/2014  . Acute anxiety 09/12/2014  . Grief at loss of child 09/03/2014  . 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      Prior to Admission medications   Medication Sig Start Date End Date Taking? Authorizing Provider  acetaminophen (TYLENOL) 650 MG CR tablet Take 650 mg by mouth daily. Reported on 05/08/2015    [provider]  aspirin EC 81 MG tablet Take 81 mg by mouth every morning.    [provider]  atorvastatin (LIPITOR) 40 MG tablet Take 1 tablet (40 mg total) by mouth daily. 07/22/16   Karamalegos, Netta NeatAlexander J, DO  cholecalciferol (VITAMIN D) 400 units TABS tablet Take 400 Units by mouth daily.    [provider]  docusate sodium (COLACE) 100 MG capsule Take 1 capsule (100 mg total) by mouth 2 (two) times daily. 04/16/16   Janeann ForehandHawkins, James H Jr., MD  doxazosin (CARDURA) 2 MG tablet Take 1 tablet (2 mg total) by mouth 2 (two) times daily. 11/25/16   Karamalegos, Netta NeatAlexander J, DO  fluticasone (FLONASE) 50 MCG/ACT nasal spray INHALE  2 SPRAYS INTO EACH NOSTRIL EVERY DAY 02/03/17   Karamalegos, Netta Neat, DO  lisinopril (PRINIVIL,ZESTRIL) 20 MG tablet Take 1 tablet (20 mg total) by mouth daily. 08/19/16   Althea Charon, Netta Neat, DO  metoprolol (LOPRESSOR) 50 MG tablet Take 1 tablet twice a day 04/16/16   Janeann Forehand., MD  omeprazole (PRILOSEC) 20 MG capsule Take 1 capsule (20 mg total) by mouth 2 (two) times daily. 11/27/16   Karamalegos, Netta Neat, DO  polyethylene glycol (MIRALAX / GLYCOLAX) packet Take 17 g by mouth daily as needed for mild constipation. 12/07/15   Enedina Finner, MD  QUEtiapine (SEROQUEL) 50 MG tablet Take 1 tablet (50 mg total) by mouth at bedtime. 10/13/16   Brandy Hale, MD    Allergies Solu-medrol [methylprednisolone acetate]    Social History Social History   Tobacco Use  . Smoking status: Never Smoker  . Smokeless tobacco: Never Used  Substance Use Topics  . Alcohol use: No    Alcohol/week: 0.0 oz  . Drug use: No    Review of Systems Patient denies headaches, rhinorrhea, blurry vision, numbness, shortness of breath, chest pain, edema, cough, abdominal pain, nausea, vomiting, diarrhea, dysuria, fevers, rashes or hallucinations unless otherwise stated above in HPI. ____________________________________________   PHYSICAL EXAM:  VITAL SIGNS: Vitals:   03/26/17 2115 03/26/17 2130  BP: (!) 112/57 (!) 136/56  Pulse: 91 91  Resp: 16 17  Temp:    SpO2: 92% 93%    Constitutional: Alert and oriented. in no acute distress. Eyes: Conjunctivae are normal.  Head: Atraumatic. Nose: No congestion/rhinnorhea. Mouth/Throat: Mucous membranes are moist.   Neck: No stridor. Painless ROM.  Cardiovascular: Normal rate, regular rhythm. Grossly normal heart sounds.  Good peripheral circulation. Respiratory: Normal respiratory effort.  No retractions. Lungs CTAB. Gastrointestinal: Soft and nontender. No distention. No abdominal bruits. No CVA tenderness. Genitourinary:  Musculoskeletal: sternotomy cite appears CDI.  No drainageNo lower extremity tenderness nor edema.  No joint effusions. Neurologic:  Normal speech and language. No gross focal neurologic deficits are appreciated. No facial droop Skin:  Skin is warm, dry and intact. No rash noted. Psychiatric: Mood and affect are normal. Speech and behavior are normal.  ____________________________________________   LABS (all labs ordered are listed, but only abnormal results are displayed)  Results for orders placed or performed during the hospital encounter of 03/26/17 (from the past  24 hour(s))  Lactic acid, plasma     Status: None   Collection Time: 03/26/17  7:50 PM  Result Value Ref Range   Lactic Acid, Venous 1.0 0.5 - 1.9 mmol/L  Comprehensive metabolic panel     Status: Abnormal   Collection Time: 03/26/17  7:55 PM  Result Value Ref Range   Sodium 134 (L) 135 - 145 mmol/L   Potassium 3.8 3.5 - 5.1 mmol/L   Chloride 99 (L) 101 - 111 mmol/L   CO2 25 22 - 32 mmol/L   Glucose, Bld 120 (H) 65 - 99 mg/dL   BUN 12 6 - 20 mg/dL   Creatinine, Ser 6.96 0.61 - 1.24 mg/dL   Calcium 8.4 (L) 8.9 - 10.3 mg/dL   Total Protein 6.6 6.5 - 8.1 g/dL   Albumin 3.5 3.5 - 5.0 g/dL   AST 24 15 - 41 U/L   ALT 20 17 - 63 U/L   Alkaline Phosphatase 78 38 - 126 U/L   Total Bilirubin 0.7 0.3 - 1.2 mg/dL   GFR calc non Af Amer >60 >60 mL/min  GFR calc Af Amer >60 >60 mL/min   Anion gap 10 5 - 15  CBC with Differential     Status: Abnormal   Collection Time: 03/26/17  7:55 PM  Result Value Ref Range   WBC 23.8 (H) 3.8 - 10.6 K/uL   RBC 3.47 (L) 4.40 - 5.90 MIL/uL   Hemoglobin 10.9 (L) 13.0 - 18.0 g/dL   HCT 16.1 (L) 09.6 - 04.5 %   MCV 94.6 80.0 - 100.0 fL   MCH 31.5 26.0 - 34.0 pg   MCHC 33.3 32.0 - 36.0 g/dL   RDW 40.9 81.1 - 91.4 %   Platelets 547 (H) 150 - 440 K/uL   Neutrophils Relative % 90 %   Neutro Abs 21.6 (H) 1.4 - 6.5 K/uL   Lymphocytes Relative 5 %   Lymphs Abs 1.1 1.0 - 3.6 K/uL   Monocytes Relative 3 %   Monocytes Absolute 0.7 0.2 - 1.0 K/uL   Eosinophils Relative 1 %   Eosinophils Absolute 0.2 0 - 0.7 K/uL   Basophils Relative 1 %   Basophils Absolute 0.2 (H) 0 - 0.1 K/uL  Protime-INR     Status: Abnormal   Collection Time: 03/26/17  7:55 PM  Result Value Ref Range   Prothrombin Time 16.8 (H) 11.4 - 15.2 seconds   INR 1.37   Urinalysis, Complete w Microscopic     Status: Abnormal   Collection Time: 03/26/17  8:04 PM  Result Value Ref Range   Color, Urine AMBER (A) YELLOW   APPearance CLOUDY (A) CLEAR   Specific Gravity, Urine 1.028 1.005 - 1.030    pH 6.0 5.0 - 8.0   Glucose, UA NEGATIVE NEGATIVE mg/dL   Hgb urine dipstick NEGATIVE NEGATIVE   Bilirubin Urine NEGATIVE NEGATIVE   Ketones, ur 20 (A) NEGATIVE mg/dL   Protein, ur 30 (A) NEGATIVE mg/dL   Nitrite POSITIVE (A) NEGATIVE   Leukocytes, UA LARGE (A) NEGATIVE   RBC / HPF 6-30 0 - 5 RBC/hpf   WBC, UA TOO NUMEROUS TO COUNT 0 - 5 WBC/hpf   Bacteria, UA MANY (A) NONE SEEN   Squamous Epithelial / LPF 0-5 (A) NONE SEEN   Mucus PRESENT    ____________________________________________  EKG My review and personal interpretation at Time: 18:37   Indication: sepsis  Rate: 110  Rhythm: sinus Axis: normal Other: normal intervals, no stemi, non specific st changes and poor r wave progression ____________________________________________  RADIOLOGY  I personally reviewed all radiographic images ordered to evaluate for the above acute complaints and reviewed radiology reports and findings.  These findings were personally discussed with the patient.  Please see medical record for radiology report.  ____________________________________________   PROCEDURES  Procedure(s) performed:  .Critical Care Performed by: Willy Eddy, MD Authorized by: Willy Eddy, MD   Critical care provider statement:    Critical care time (minutes):  30   Critical care time was exclusive of:  Separately billable procedures and treating other patients   Critical care was necessary to treat or prevent imminent or life-threatening deterioration of the following conditions:  Sepsis   Critical care was time spent personally by me on the following activities:  Development of treatment plan with patient or surrogate, discussions with consultants, evaluation of patient's response to treatment, examination of patient, obtaining history from patient or surrogate, ordering and performing treatments and interventions, ordering and review of laboratory studies, ordering and review of radiographic studies,  pulse oximetry, re-evaluation of patient's condition and review of old charts  Critical Care performed: yes  ____________________________________________   INITIAL IMPRESSION / ASSESSMENT AND PLAN / ED COURSE  Pertinent labs & imaging results that were available during my care of the patient were reviewed by me and considered in my medical decision making (see chart for details).  DDX: Sepsis, pneumonia, UTI mediastinitis, abscess, atelectasis  Erik Palmer is a 83 y.o. who presents to the ED with fever post CABG.  Patient frail appearing but in no acute distress.  His febrile to 100.2 after anti-diuresis.  Surgical wound appears clean dry and intact.  Blood work will be sent for evaluation of sepsis.  We will give judicious hydration as the patient is currently hemodynamically stable.  The patient will be placed on continuous pulse oximetry and telemetry for monitoring.  Laboratory evaluation will be sent to evaluate for the above complaints.     Clinical Course as of Mar 26 2312  Thu Mar 26, 2017  2216 Basophils Absolute: (!) 0.2 [PR]    Clinical Course User Index [PR] Willy Eddy, MD  Patient with evidence of UTI.  Blood work consistent with this finding.  Patient will require transfer to [place].  Patient given IV antibiotics for hospital associated UTI.  Have discussed with the patient and available family all diagnostics and treatments performed thus far and all questions were answered to the best of my ability. The patient demonstrates understanding and agreement with plan.    ____________________________________________   FINAL CLINICAL IMPRESSION(S) / ED DIAGNOSES  Final diagnoses:  Sepsis, due to unspecified organism (HCC)  Urinary tract infection without hematuria, site unspecified  Hx of CABG      NEW MEDICATIONS STARTED DURING THIS VISIT:  New Prescriptions   No medications on file     Note:  This document was prepared using Dragon voice  recognition software and may include unintentional dictation errors.    Willy Eddy, MD 03/26/17 2328

## 2017-03-27 LAB — TROPONIN I: Troponin I: 0.04 ng/mL (ref <0.03)

## 2017-03-27 NOTE — ED Notes (Signed)
I had received troponin result in epic inbox as a late lab.  I could not tell that this was related to [facility].  [facility].

## 2017-03-29 LAB — URINE CULTURE

## 2017-03-30 NOTE — Progress Notes (Signed)
ED Antimicrobial Stewardship Positive Culture Follow Up   Erik Palmer is an 83 y.o. male who [facility] with a chief complaint of  Chief Complaint  Patient presents with  . Post-op Problem  . Fever    Recent Results (from the past 720 hour(s))  Culture, blood (Routine x 2)     Status: None (Preliminary result)   Collection Time: 03/26/17  8:00 PM  Result Value Ref Range Status   Specimen Description BLOOD LAC  Final   Special Requests   Final    BOTTLES DRAWN AEROBIC AND ANAEROBIC Blood Culture results may not be optimal due to an excessive volume of blood received in culture bottles   Culture   Final    NO GROWTH 4 DAYS Performed at [facility], [address] [place], [place] [address]    Report Status PENDING  Incomplete  Culture, blood (Routine x 2)     Status: None (Preliminary result)   Collection Time: 03/26/17  8:04 PM  Result Value Ref Range Status   Specimen Description BLOOD LFOA  Final   Special Requests   Final    BOTTLES DRAWN AEROBIC AND ANAEROBIC Blood Culture adequate volume   Culture   Final    NO GROWTH 4 DAYS Performed at [facility], [address] [place], [place] [address]    Report Status PENDING  Incomplete  Urine culture     Status: Abnormal   Collection Time: 03/26/17  8:04 PM  Result Value Ref Range Status   Specimen Description   Final    URINE, RANDOM Performed at [facility], [address] [place], [place] [address]    Special Requests   Final    NONE Performed at [facility], [address] [place], [place] [address]    Culture >=100,000 COLONIES/mL ENTEROBACTER AEROGENES (A)  Final   Report Status 03/29/2017 FINAL  Final   Organism ID, Bacteria ENTEROBACTER AEROGENES (A)  Final      Susceptibility   Enterobacter aerogenes - MIC*    CEFAZOLIN >=64 RESISTANT Resistant     CEFTRIAXONE 8 SENSITIVE Sensitive     CIPROFLOXACIN <=0.25 SENSITIVE Sensitive    GENTAMICIN <=1 SENSITIVE Sensitive     IMIPENEM <=0.25 SENSITIVE Sensitive     NITROFURANTOIN 64 INTERMEDIATE Intermediate     TRIMETH/SULFA <=20 SENSITIVE Sensitive     PIP/TAZO 64 INTERMEDIATE Intermediate     * >=100,000 COLONIES/mL ENTEROBACTER AEROGENES    []  Treated with nothing, organism resistant to prescribed antimicrobial [x]  Patient discharged originally without antimicrobial agent and treatment is now indicated  Called and spoke with Erik Palmer, his wife, and also Nurse Practitioner Alfredo MartinezShelly Terwilliger at the [facility] in [place]. [facility] where they repeated his UA and treated him on broad spectrum antibiotics. Shelly Terwillinger stated to me [facility] infectious disease team addressed his urine bacteria and he was being discharged on appropriate antibiotics. Discussed this with Dr Sharma CovertNorman who is in agreement care has been transferred.     Luan PullingGarrett Alexes Lamarque, PharmD, MBA, Liz ClaiborneBCGP Clinical Pharmacist [facility]

## 2017-03-31 LAB — CULTURE, BLOOD (ROUTINE X 2)
CULTURE: NO GROWTH
Culture: NO GROWTH
SPECIAL REQUESTS: ADEQUATE

## 2017-04-02 ENCOUNTER — Telehealth: Payer: Self-pay | Admitting: Family Medicine

## 2017-04-02 NOTE — Telephone Encounter (Signed)
[facility]  His wife wants to get a handicap placard before pt's next visit to [facility] on Monday.  Her call back number is [phone]

## 2017-04-02 NOTE — Telephone Encounter (Signed)
Completed Handicap Placard form, cannot walk 200 ft w/o stopping to rest, and severely limited with orthopedic condition history of hip fracture.  May be picked up from our office anytime starting today Thursday 04/02/17, or on Friday 04/03/17  Saralyn PilarAlexander Odysseus Cada, DO [facility] 04/02/2017, 12:54 PM

## 2017-04-13 ENCOUNTER — Ambulatory Visit: Payer: Medicare Other | Admitting: Psychiatry

## 2017-04-27 ENCOUNTER — Ambulatory Visit: Payer: Medicare Other | Admitting: Psychiatry

## 2017-05-25 ENCOUNTER — Encounter: Payer: Self-pay | Admitting: Psychiatry

## 2017-05-25 ENCOUNTER — Other Ambulatory Visit: Payer: Self-pay

## 2017-05-25 ENCOUNTER — Ambulatory Visit (INDEPENDENT_AMBULATORY_CARE_PROVIDER_SITE_OTHER): Payer: Medicare Other | Admitting: Psychiatry

## 2017-05-25 VITALS — BP 155/79 | HR 87 | Temp 97.4°F | Wt 229.6 lb

## 2017-05-25 DIAGNOSIS — F411 Generalized anxiety disorder: Secondary | ICD-10-CM | POA: Diagnosis not present

## 2017-05-25 DIAGNOSIS — F330 Major depressive disorder, recurrent, mild: Secondary | ICD-10-CM

## 2017-05-25 MED ORDER — QUETIAPINE FUMARATE 50 MG PO TABS
50.0000 mg | ORAL_TABLET | Freq: Every day | ORAL | 1 refills | Status: DC
Start: 2017-05-25 — End: 2017-11-23

## 2017-05-25 NOTE — Progress Notes (Signed)
Psychiatric MD/NP Follow up Note  Patient Identification: Erik Palmer MRN:  [id number] Date of Evaluation:  05/25/2017 Referral Source: [facility] Chief Complaint:   Chief Complaint    Follow-up; Medication Refill     Visit Diagnosis:    ICD-10-CM   1. MDD (major depressive disorder), recurrent episode, mild (HCC) F33.0   2. GAD (generalized anxiety disorder) F41.1    Diagnosis:   Patient Active Problem List   Diagnosis Date Noted  . Anemia [D64.9] 11/25/2016  . BPH with obstruction/lower urinary tract symptoms [N40.1, N13.8] 07/22/2016  . Screening for prostate cancer [Z12.5] 07/22/2016  . Abnormal glucose [R73.09] 07/22/2016  . SIADH (syndrome of inappropriate ADH production) (HCC) [E22.2] 01/08/2016  . Insomnia [G47.00] 12/28/2015  . Fracture of unspecified part of neck of right femur, subsequent encounter for closed fracture with routine healing [S72.001D] 12/27/2015  . Closed right hip fracture, initial encounter (HCC) [S72.001A] 12/04/2015  . Hyperlipidemia [E78.5] 05/08/2015  . Essential hypertension [I10] 02/13/2015  . Morbid obesity (HCC) [E66.01] 12/22/2014  . Acute anxiety [F41.9] 09/12/2014  . Grief at loss of child [F43.21, Z63.4] 09/03/2014  . Anxiety disorder due to general medical condition [F06.4] 09/03/2014   History of Present Illness:    Patient is a 83 -year-old male who presented with his wife for follow up. His wife reported that he was admitted to the [facility] in January after he was diagnosed with chest pain and had the bypass surgery and stent placement and was there for almost a month. He was having a myocardial infarction. Patient was also diagnosed with UTI. Patient is now stable and has been compliant with his medications. Patient reported that he has to fight for his Seroquel while he was in the hospital. He reported that it helps with his sleep and he feels calm and alert. He does not have any acute symptoms at this time. He  currently denied having any suicidal homicidal ideations or plans.   He was pleasant and cooperative as usual. He was Receptive of his wife who helped him during the hospitalization .    He currently denied having any suicidal homicidal ideations or plans.    His mood symptoms are under control and he appears calm and collective during the interview.      Past Medical History:  Past Medical History:  Diagnosis Date  . Angina pectoris syndrome (HCC)   . 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 Arthritis   . GERD (gastroesophageal reflux disease)   . Grief at loss of child 09/03/2014  . Hypercholesteremia   . Hypertension   . Prostate enlargement     Past Surgical History:  Procedure Laterality Date  . TOTAL HIP ARTHROPLASTY Right 12/05/2015   Procedure: TOTAL HIP ARTHROPLASTY ANTERIOR APPROACH;  Surgeon: Kennedy Bucker, MD;  Location: [facility] ORS;  Service: Orthopedics;  Laterality: Right;   Family History:  Family History  Problem Relation Age of Onset  . Hypertension Mother   . Heart block Mother   . Stroke Mother   . Depression Mother   . Cancer Father   . Heart attack Sister   . COPD Sister   . Hypertension Brother   . Hypertension Brother   . Hypertension Brother   . Hypertension Brother   . Heart attack Brother   . Diabetes Neg Hx    Social History:   Social History   Socioeconomic History  . Marital status: Married    Spouse name: Not on  file  . Number of children: Not on file  . Years of education: Not on file  . Highest education level: Not on file  Occupational History  . Not on file  Social Needs  . Financial resource strain: Not on file  . Food insecurity:    Worry: Not on file    Inability: Not on file  . Transportation needs:    Medical: Not on file    Non-medical: Not on file  Tobacco Use  . Smoking status: Never Smoker  . Smokeless tobacco: Never Used  Substance and Sexual Activity  . Alcohol use: No    Alcohol/week:  0.0 oz  . Drug use: No  . Sexual activity: Not Currently  Lifestyle  . Physical activity:    Days per week: Not on file    Minutes per session: Not on file  . Stress: Not on file  Relationships  . Social connections:    Talks on phone: Not on file    Gets together: Not on file    Attends religious service: Not on file    Active member of club or organization: Not on file    Attends meetings of clubs or organizations: Not on file    Relationship status: Not on file  Other Topics Concern  . Not on file  Social History Narrative  . Not on file   Additional Social History:   Currently married and lives with his wife. He had 3 children and one of them passed away. She was in [employer] and is currently retired.  Musculoskeletal: Strength & Muscle Tone: within normal limits Gait & Station: normal Patient leans: Right  Psychiatric Specialty Exam: Medication Refill     Review of Systems  Psychiatric/Behavioral: Positive for depression. The patient is nervous/anxious and has insomnia.   All other systems reviewed and are negative.   Blood pressure (!) 155/79, pulse 87, temperature (!) 97.4 F (36.3 C), temperature source Oral, weight 229 lb 9.6 oz (104.1 kg).Body mass index is 32.94 kg/m.  General Appearance: Casual and Fairly Groomed  Eye Contact:  Fair  Speech:  Clear and Coherent and Normal Rate  Volume:  Normal  Mood:  Euthymic  Affect:  Congruent  Thought Process:  Coherent  Orientation:  Full (Time, Place, and Person)  Thought Content:  WDL  Suicidal Thoughts:  No  Homicidal Thoughts:  No  Memory:  Immediate;   Fair  Judgement:  Fair  Insight:  Fair  Psychomotor Activity:  Normal  Concentration:  Fair  Recall:  [employer] of Knowledge:Fair  Language: Fair  Akathisia:  No  Handed:  Right  AIMS (if indicated):    Assets:  Communication Skills Desire for Improvement Housing Intimacy Physical Health Social Support  ADL's:  Intact  Cognition: WNL  Sleep:   normal     Allergies:   Allergies  Allergen Reactions  . Solu-Medrol [Methylprednisolone Acetate] Anxiety   Current Medications: Current Outpatient Medications  Medication Sig Dispense Refill  . acetaminophen (TYLENOL) 650 MG CR tablet Take 650 mg by mouth daily. Reported on 05/08/2015    . amiodarone (PACERONE) 200 MG tablet Take 200 mg by mouth daily.    [name] amLODipine (NORVASC) 10 MG tablet Take 10 mg by mouth daily.    [name] apixaban (ELIQUIS) 5 MG TABS tablet Take 5 mg by mouth 2 (two) times daily.    [name] atorvastatin (LIPITOR) 40 MG tablet Take 1 tablet (40 mg total) by mouth daily. 90 tablet 3  .  carvedilol (COREG) 6.25 MG tablet Take 6.25 mg by mouth 2 (two) times daily with a meal.    . cholecalciferol (VITAMIN D) 400 units TABS tablet Take 400 Units by mouth daily.    [name]. docusate sodium (COLACE) 100 MG capsule Take 1 capsule (100 mg total) by mouth 2 (two) times daily. 60 capsule 12  . doxazosin (CARDURA) 2 MG tablet Take 1 tablet (2 mg total) by mouth 2 (two) times daily. 180 tablet 3  . omeprazole (PRILOSEC) 20 MG capsule Take 1 capsule (20 mg total) by mouth 2 (two) times daily. 60 capsule 6  . potassium chloride SA (K-DUR,KLOR-CON) 20 MEQ tablet Take 20 mEq by mouth daily.    . QUEtiapine (SEROQUEL) 50 MG tablet Take 1 tablet (50 mg total) by mouth at bedtime. 90 tablet 1   No current facility-administered medications for this visit.     Previous Psychotropic Medications: Received Soulmedrol in 2014 led  to his anxiety symptoms Valium and Prozac -2014 by Dr Juanetta GoslingHawkins  Prozac again in this summer Celexa prescribed by Dr Juanetta GoslingHawkins September 04 2014- anxiety and depression.  Insomnia and takes Meclizine, Xanax and Motrin PM to help him sleep.  Dr Juanetta GoslingHawkins started Nortriptyline for sleep, did not help.   Substance Abuse History in the last 12 months:  No.  Consequences of Substance Abuse: Negative NA  Medical Decision Making:  Review of Psycho-Social Stressors (1) and Review and  summation of old records (2)  Treatment Plan Summary: Medication management   Discussed with patient what the medications and he will continue on the following medications  Mood symptoms/ sleep Continue Seroquel 50 mg by mouth daily at bedtime and was given 90 day supply of the medications Discussed with him in detail about the medication dose benefits and alternatives.   Follow-up in 6  month or earlier    This note was generated in part or whole with voice recognition software. Voice regonition is usually quite accurate but there are transcription errors that can and very often do occur. I apologize for any typographical errors that were not detected and corrected.    Brandy HaleUzma Evanny Ellerbe, MD

## 2017-05-28 ENCOUNTER — Other Ambulatory Visit: Payer: Self-pay | Admitting: Family Medicine

## 2017-07-16 ENCOUNTER — Other Ambulatory Visit: Payer: Medicare Other

## 2017-07-16 DIAGNOSIS — I10 Essential (primary) hypertension: Secondary | ICD-10-CM | POA: Diagnosis not present

## 2017-07-16 DIAGNOSIS — E782 Mixed hyperlipidemia: Secondary | ICD-10-CM | POA: Diagnosis not present

## 2017-07-16 DIAGNOSIS — Z125 Encounter for screening for malignant neoplasm of prostate: Secondary | ICD-10-CM

## 2017-07-16 DIAGNOSIS — R7309 Other abnormal glucose: Secondary | ICD-10-CM

## 2017-07-16 DIAGNOSIS — D509 Iron deficiency anemia, unspecified: Secondary | ICD-10-CM

## 2017-07-17 LAB — CBC WITH DIFFERENTIAL/PLATELET
BASOS PCT: 0.3 %
Basophils Absolute: 18 cells/uL (ref 0–200)
EOS PCT: 2 %
Eosinophils Absolute: 120 cells/uL (ref 15–500)
HCT: 32.1 % — ABNORMAL LOW (ref 38.5–50.0)
HEMOGLOBIN: 10.6 g/dL — AB (ref 13.2–17.1)
Lymphs Abs: 1788 cells/uL (ref 850–3900)
MCH: 28.7 pg (ref 27.0–33.0)
MCHC: 33 g/dL (ref 32.0–36.0)
MCV: 87 fL (ref 80.0–100.0)
MONOS PCT: 8.3 %
MPV: 10.2 fL (ref 7.5–12.5)
NEUTROS ABS: 3576 {cells}/uL (ref 1500–7800)
Neutrophils Relative %: 59.6 %
Platelets: 297 10*3/uL (ref 140–400)
RBC: 3.69 10*6/uL — ABNORMAL LOW (ref 4.20–5.80)
RDW: 13.7 % (ref 11.0–15.0)
Total Lymphocyte: 29.8 %
WBC mixed population: 498 cells/uL (ref 200–950)
WBC: 6 10*3/uL (ref 3.8–10.8)

## 2017-07-17 LAB — HEMOGLOBIN A1C
EAG (MMOL/L): 6.5 (calc)
HEMOGLOBIN A1C: 5.7 %{Hb} — AB (ref <5.7)
MEAN PLASMA GLUCOSE: 117 (calc)

## 2017-07-17 LAB — COMPLETE METABOLIC PANEL WITH GFR
AG Ratio: 1.8 (calc) (ref 1.0–2.5)
ALKALINE PHOSPHATASE (APISO): 70 U/L (ref 40–115)
ALT: 10 U/L (ref 9–46)
AST: 11 U/L (ref 10–35)
Albumin: 4.1 g/dL (ref 3.6–5.1)
BUN: 15 mg/dL (ref 7–25)
CO2: 27 mmol/L (ref 20–32)
CREATININE: 0.82 mg/dL (ref 0.70–1.11)
Calcium: 9.1 mg/dL (ref 8.6–10.3)
Chloride: 106 mmol/L (ref 98–110)
GFR, EST NON AFRICAN AMERICAN: 84 mL/min/{1.73_m2} (ref >=60)
GFR, Est African American: 97 mL/min/{1.73_m2} (ref >=60)
Globulin: 2.3 g/dL (calc) (ref 1.9–3.7)
Glucose, Bld: 94 mg/dL (ref 65–99)
Potassium: 3.9 mmol/L (ref 3.5–5.3)
Sodium: 141 mmol/L (ref 135–146)
Total Bilirubin: 0.4 mg/dL (ref 0.2–1.2)
Total Protein: 6.4 g/dL (ref 6.1–8.1)

## 2017-07-17 LAB — LIPID PANEL
CHOL/HDL RATIO: 3.9 (calc) (ref <5.0)
CHOLESTEROL: 170 mg/dL (ref <200)
HDL: 44 mg/dL (ref >40)
LDL Cholesterol (Calc): 96 mg/dL (calc)
Non-HDL Cholesterol (Calc): 126 mg/dL (calc) (ref <130)
Triglycerides: 201 mg/dL — ABNORMAL HIGH (ref <150)

## 2017-07-17 LAB — PSA, TOTAL WITH REFLEX TO PSA, FREE: PSA, Total: 2.7 ng/mL (ref <=4.0)

## 2017-07-19 ENCOUNTER — Encounter: Payer: Self-pay | Admitting: Family Medicine

## 2017-07-20 ENCOUNTER — Other Ambulatory Visit: Payer: Self-pay

## 2017-07-21 ENCOUNTER — Ambulatory Visit (INDEPENDENT_AMBULATORY_CARE_PROVIDER_SITE_OTHER): Payer: Medicare Other

## 2017-07-21 ENCOUNTER — Other Ambulatory Visit: Payer: Self-pay

## 2017-07-21 ENCOUNTER — Encounter: Payer: Self-pay | Admitting: Family Medicine

## 2017-07-21 ENCOUNTER — Ambulatory Visit (INDEPENDENT_AMBULATORY_CARE_PROVIDER_SITE_OTHER): Payer: Medicare Other | Admitting: Family Medicine

## 2017-07-21 VITALS — BP 144/68 | HR 84 | Temp 98.1°F | Resp 16 | Ht 69.0 in | Wt 238.6 lb

## 2017-07-21 VITALS — BP 182/88 | HR 84 | Temp 98.1°F | Resp 16 | Ht 69.0 in | Wt 238.6 lb

## 2017-07-21 DIAGNOSIS — F064 Anxiety disorder due to known physiological condition: Secondary | ICD-10-CM

## 2017-07-21 DIAGNOSIS — Z0000 Encounter for general adult medical examination without abnormal findings: Secondary | ICD-10-CM

## 2017-07-21 DIAGNOSIS — N401 Enlarged prostate with lower urinary tract symptoms: Secondary | ICD-10-CM

## 2017-07-21 DIAGNOSIS — K5909 Other constipation: Secondary | ICD-10-CM | POA: Diagnosis not present

## 2017-07-21 DIAGNOSIS — E782 Mixed hyperlipidemia: Secondary | ICD-10-CM | POA: Diagnosis not present

## 2017-07-21 DIAGNOSIS — R7309 Other abnormal glucose: Secondary | ICD-10-CM

## 2017-07-21 DIAGNOSIS — N138 Other obstructive and reflux uropathy: Secondary | ICD-10-CM | POA: Diagnosis not present

## 2017-07-21 DIAGNOSIS — I10 Essential (primary) hypertension: Secondary | ICD-10-CM

## 2017-07-21 DIAGNOSIS — F5104 Psychophysiologic insomnia: Secondary | ICD-10-CM

## 2017-07-21 DIAGNOSIS — E669 Obesity, unspecified: Secondary | ICD-10-CM | POA: Diagnosis not present

## 2017-07-21 MED ORDER — POLYETHYLENE GLYCOL 3350 17 GM/SCOOP PO POWD
17.0000 g | Freq: Every day | ORAL | 11 refills | Status: DC
Start: 2017-07-21 — End: 2021-08-08

## 2017-07-21 MED ORDER — ATORVASTATIN CALCIUM 40 MG PO TABS: 40.0000 mg | ORAL_TABLET | Freq: Every day | ORAL | 3 refills | Status: DC

## 2017-07-21 NOTE — Progress Notes (Signed)
Subjective:   Erik Palmer is a 83 y.o. male who presents for Medicare Annual/Subsequent preventive examination.  Review of Systems:   Cardiac Risk Factors include: male gender;hypertension;advanced age (>53men, >73 women);obesity (BMI >30kg/m2);dyslipidemia     Objective:    Vitals: BP (!) 182/88 (BP Location: Right Arm, Patient Position: Sitting)   Pulse 84   Temp 98.1 F (36.7 C) (Oral)   Resp 16   Ht  (1.753 m)   Wt 238 lb 9.6 oz (108.2 kg)   BMI 35.24 kg/m   Body mass index is 35.24 kg/m.  Advanced Directives 07/21/2017 03/26/2017 12/27/2015 12/27/2015 12/27/2015 12/05/2015 12/04/2015  Does Patient Have a Medical Advance Directive? No No No No No No No  Would patient like information on creating a medical advance directive? No - Patient declined - No - patient declined information No - patient declined information No - patient declined information - Yes - Educational materials given  Some encounter information is confidential and restricted. Go to Review Flowsheets activity to see all data.    Tobacco Social History   Tobacco Use  Smoking Status Never Smoker  Smokeless Tobacco Never Used     Counseling given: Not Answered   Clinical Intake:  Pre-visit preparation completed: Yes  Pain : No/denies pain     Nutritional Status: BMI > 30  Obese Nutritional Risks: None Diabetes: No  How often do you need to have someone help you when you read instructions, pamphlets, or other written materials from your doctor or pharmacy?: 1 - Never What is the last grade level you completed in school?: 12th grade   Interpreter Needed?: No  Information entered by :: Maverick Patman,LPN   Past Medical History:  Diagnosis Date  . Angina pectoris syndrome (HCC)   . 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 Arthritis   . GERD (gastroesophageal reflux disease)   . Grief at loss of child 09/03/2014  . Hypercholesteremia   . Hypertension   . Prostate  enlargement    Past Surgical History:  Procedure Laterality Date  . CORONARY ARTERY BYPASS GRAFT  03/25/2017   at [facility]  . TOTAL HIP ARTHROPLASTY Right 12/05/2015   Procedure: TOTAL HIP ARTHROPLASTY ANTERIOR APPROACH;  Surgeon: Kennedy Bucker, MD;  Location: [facility] ORS;  Service: Orthopedics;  Laterality: Right;   Family History  Problem Relation Age of Onset  . Hypertension Mother   . Heart block Mother   . Stroke Mother   . Depression Mother   . Cancer Father   . Heart attack Sister   . COPD Sister   . Hypertension Brother   . Hypertension Brother   . Hypertension Brother   . Hypertension Brother   . Heart attack Brother   . Diabetes Neg Hx    Social History   Socioeconomic History  . Marital status: Married    Spouse name: Not on file  . Number of children: Not on file  . Years of education: Not on file  . Highest education level: Not on file  Occupational History  . Not on file  Social Needs  . Financial resource strain: Not hard at all  . Food insecurity:    Worry: Never true    Inability: Never true  . Transportation needs:    Medical: No    Non-medical: No  Tobacco Use  . Smoking status: Never Smoker  . Smokeless tobacco: Never Used  Substance and Sexual Activity  . Alcohol use: No  Alcohol/week: 0.0 oz  . Drug use: No  . Sexual activity: Not Currently  Lifestyle  . Physical activity:    Days per week: 0 days    Minutes per session: 0 min  . Stress: Not at all  Relationships  . Social connections:    Talks on phone: Never    Gets together: More than three times a week    Attends religious service: Never    Active member of club or organization: No    Attends meetings of clubs or organizations: Never    Relationship status: Married  Other Topics Concern  . Not on file  Social History Narrative  . Not on file    Outpatient Encounter Medications as of 07/21/2017  Medication Sig  . acetaminophen (TYLENOL) 650 MG CR tablet Take 650 mg by mouth daily.  Reported on 05/08/2015  . amLODipine (NORVASC) 10 MG tablet Take 5 mg by mouth daily.   [name] apixaban (ELIQUIS) 5 MG TABS tablet Take 5 mg by mouth 2 (two) times daily.  [name] aspirin EC 81 MG tablet Take 81 mg by mouth daily.  [name] atorvastatin (LIPITOR) 40 MG tablet Take 1 tablet (40 mg total) by mouth daily.  . carvedilol (COREG) 6.25 MG tablet Take 6.25 mg by mouth 2 (two) times daily with a meal.  . docusate sodium (COLACE) 100 MG capsule Take 1 capsule (100 mg total) by mouth 2 (two) times daily.  [name] doxazosin (CARDURA) 2 MG tablet Take 1 tablet (2 mg total) by mouth 2 (two) times daily.  [name] omeprazole (PRILOSEC) 20 MG capsule TAKE 1 CAPSULE BY MOUTH TWICE A DAY  . polyethylene glycol (MIRALAX / GLYCOLAX) packet Take 17 g by mouth daily.  . QUEtiapine (SEROQUEL) 50 MG tablet Take 1 tablet (50 mg total) by mouth at bedtime.  [name] amiodarone (PACERONE) 200 MG tablet Take 200 mg by mouth daily.  . cholecalciferol (VITAMIN D) 400 units TABS tablet Take 400 Units by mouth daily.  . potassium chloride SA (K-DUR,KLOR-CON) 20 MEQ tablet Take 20 mEq by mouth daily.   No facility-administered encounter medications on file as of 07/21/2017.     Activities of Daily Living In your present state of health, do you have any difficulty performing the following activities: 07/21/2017  Hearing? Y  Vision? N  Difficulty concentrating or making decisions? Y  Walking or climbing stairs? N  Dressing or bathing? N  Doing errands, shopping? N  Preparing Food and eating ? N  Using the Toilet? N  In the past six months, have you accidently leaked urine? N  Do you have problems with loss of bowel control? N  Managing your Medications? N  Managing your Finances? N  Housekeeping or managing your Housekeeping? N  Some recent data might be hidden    Patient Care Team: [facility] as PCP - General (General Practice)   Assessment:   This is a routine wellness examination for Erik Palmer.  Exercise Activities and  Dietary recommendations Current Exercise Habits: The patient does not participate in regular exercise at present, Exercise limited by: None identified  Goals    . DIET - INCREASE WATER INTAKE     Recommend drinking at least 6-8 glasses of water a day     . Remain Stable     Keep active and control anxiety.        Fall Risk Fall Risk  07/21/2017 11/25/2016 07/22/2016 01/08/2016 05/08/2015  Falls in the past year? No No No Yes No  Number falls in past yr: - - - 1 -  Injury with Fall? - - - Yes -  Follow up - - - Education provided -   Is the patient's home free of loose throw rugs in walkways, pet beds, electrical cords, etc?   yes      Grab bars in the bathroom? yes      Handrails on the stairs?   yes      Adequate lighting?   yes  Timed Get Up and Go Performed: Completed in 8 seconds with no use of assistive devices, steady gait. No intervention needed at this time.   Depression Screen PHQ 2/9 Scores 07/21/2017 11/25/2016 07/22/2016 05/08/2015  PHQ - 2 Score 0 0 0 0  PHQ- 9 Score - 0 - -    Cognitive Function MMSE - Mini Mental State Exam 12/22/2014  Orientation to time 5  Orientation to Place 5  Registration 3  Attention/ Calculation 5  Recall 3  Language- name 2 objects 2  Language- repeat 1  Language- follow 3 step command 3  Language- read & follow direction 1  Write a sentence 1  Copy design 1  Total score 30     6CIT Screen 07/21/2017  What Year? 0 points  What month? 0 points  What time? 0 points  Count back from 20 0 points  Months in reverse 0 points  Repeat phrase 0 points  Total Score 0    Immunization History  Administered Date(s) Administered  . Pneumococcal Conjugate-13 06/20/2014  . Tdap 06/20/2014    Declined pneumovax 23   Qualifies for Shingles Vaccine?  Yes, discussed shingrix vaccine   Screening Tests Health Maintenance  Topic Date Due  . PNA vac Low Risk Adult (2 of 2 - PPSV23) 07/22/2018 (Originally 06/20/2015)  . INFLUENZA VACCINE   10/01/2017  . TETANUS/TDAP  06/19/2024   Cancer Screenings: Lung: Low Dose CT Chest recommended if Age 50-80 years, 30 pack-year currently smoking OR have quit w/in 15years. Patient does not qualify. Colorectal: no longer required  Additional Screenings:  Hepatitis C Screening:not indicated      Plan:    I have personally reviewed and addressed the Medicare Annual Wellness questionnaire and have noted the following in the patient's chart:  A. Medical and social history B. Use of alcohol, tobacco or illicit drugs  C. Current medications and supplements D. Functional ability and status E.  Nutritional status F.  Physical activity G. Advance directives H. List of other physicians I.  Hospitalizations, surgeries, and ER visits in previous 12 months J.  Vitals K. Screenings such as hearing and vision if needed, cognitive and depression L. Referrals and appointments   In addition, I have reviewed and discussed with patient certain preventive protocols, quality metrics, and best practice recommendations. A written personalized care plan for preventive services as well as general preventive health recommendations were provided to patient.   Signed,  Marin Roberts, LPN Nurse Health Advisor   Nurse Notes: patient brought copies of [facility] paperwork, copy made for file- provided to Dr.Karamalegos for review.

## 2017-07-21 NOTE — Patient Instructions (Addendum)
Erik Palmer , Thank you for taking time to come for your Medicare Wellness Visit. I appreciate your ongoing commitment to your health goals. Please review the following plan we discussed and let me know if I can assist you in the future.   Screening recommendations/referrals: Colonoscopy: no longer required  Recommended yearly ophthalmology/optometry visit for glaucoma screening and checkup Recommended yearly dental visit for hygiene and checkup  Vaccinations: Influenza vaccine: due 11/2017 Pneumococcal vaccine: pneumovax 23 due - declined Tdap vaccine: completed 06/20/2014 Shingles vaccine: eligible, check with your insurance company for coverage     Advanced directives: Advance directive discussed with you today. Even though you declined this today please call our office should you change your mind and we can give you the proper paperwork for you to fill out.  Conditions/risks identified: Recommend drinking at least 6-8 glasses of water a day   Next appointment: Follow up in one year for your annual wellness exam.   Preventive Care 65 Years and Older, Male Preventive care refers to lifestyle choices and visits with your health care provider that can promote health and wellness. What does preventive care include?  A yearly physical exam. This is also called an annual well check.  Dental exams once or twice a year.  Routine eye exams. Ask your health care provider how often you should have your eyes checked.  Personal lifestyle choices, including:  Daily care of your teeth and gums.  Regular physical activity.  Eating a healthy diet.  Avoiding tobacco and drug use.  Limiting alcohol use.  Practicing safe sex.  Taking low doses of aspirin every day.  Taking vitamin and mineral supplements as recommended by your health care provider. What happens during an annual well check? The services and screenings done by your health care provider during your annual well check will  depend on your age, overall health, lifestyle risk factors, and family history of disease. Counseling  Your health care provider may ask you questions about your:  Alcohol use.  Tobacco use.  Drug use.  Emotional well-being.  Home and relationship well-being.  Sexual activity.  Eating habits.  History of falls.  Memory and ability to understand (cognition).  Work and work Astronomer. Screening  You may have the following tests or measurements:  Height, weight, and BMI.  Blood pressure.  Lipid and cholesterol levels. These may be checked every 5 years, or more frequently if you are over 31 years old.  Skin check.  Lung cancer screening. You may have this screening every year starting at age 34 if you have a 30-pack-year history of smoking and currently smoke or have quit within the past 15 years.  Fecal occult blood test (FOBT) of the stool. You may have this test every year starting at age 26.  Flexible sigmoidoscopy or colonoscopy. You may have a sigmoidoscopy every 5 years or a colonoscopy every 10 years starting at age 11.  Prostate cancer screening. Recommendations will vary depending on your family history and other risks.  Hepatitis C blood test.  Hepatitis B blood test.  Sexually transmitted disease (STD) testing.  Diabetes screening. This is done by checking your blood sugar (glucose) after you have not eaten for a while (fasting). You may have this done every 1-3 years.  Abdominal aortic aneurysm (AAA) screening. You may need this if you are a current or former smoker.  Osteoporosis. You may be screened starting at age 83 if you are at high risk. Talk with your health care provider  about your test results, treatment options, and if necessary, the need for more tests. Vaccines  Your health care provider may recommend certain vaccines, such as:  Influenza vaccine. This is recommended every year.  Tetanus, diphtheria, and acellular pertussis (Tdap,  Td) vaccine. You may need a Td booster every 10 years.  Zoster vaccine. You may need this after age 7.  Pneumococcal 13-valent conjugate (PCV13) vaccine. One dose is recommended after age 81.  Pneumococcal polysaccharide (PPSV23) vaccine. One dose is recommended after age 69. Talk to your health care provider about which screenings and vaccines you need and how often you need them. This information is not intended to replace advice given to you by your health care provider. Make sure you discuss any questions you have with your health care provider. Document Released: 03/16/2015 Document Revised: 11/07/2015 Document Reviewed: 12/19/2014 Elsevier Interactive Patient Education  2017 [employer] Prevention in the Home Falls can cause injuries. They can happen to people of all ages. There are many things you can do to make your home safe and to help prevent falls. What can I do on the outside of my home?  Regularly fix the edges of walkways and driveways and fix any cracks.  Remove anything that might make you trip as you walk through a door, such as a raised step or threshold.  Trim any bushes or trees on the path to your home.  Use bright outdoor lighting.  Clear any walking paths of anything that might make someone trip, such as rocks or tools.  Regularly check to see if handrails are loose or broken. Make sure that both sides of any steps have handrails.  Any raised decks and porches should have guardrails on the edges.  Have any leaves, snow, or ice cleared regularly.  Use sand or salt on walking paths during winter.  Clean up any spills in your garage right away. This includes oil or grease spills. What can I do in the bathroom?  Use night lights.  Install grab bars by the toilet and in the tub and shower. Do not use towel bars as grab bars.  Use non-skid mats or decals in the tub or shower.  If you need to sit down in the shower, use a plastic, non-slip  stool.  Keep the floor dry. Clean up any water that spills on the floor as soon as it happens.  Remove soap buildup in the tub or shower regularly.  Attach bath mats securely with double-sided non-slip rug tape.  Do not have throw rugs and other things on the floor that can make you trip. What can I do in the bedroom?  Use night lights.  Make sure that you have a light by your bed that is easy to reach.  Do not use any sheets or blankets that are too big for your bed. They should not hang down onto the floor.  Have a firm chair that has side arms. You can use this for support while you get dressed.  Do not have throw rugs and other things on the floor that can make you trip. What can I do in the kitchen?  Clean up any spills right away.  Avoid walking on wet floors.  Keep items that you use a lot in easy-to-reach places.  If you need to reach something above you, use a strong step stool that has a grab bar.  Keep electrical cords out of the way.  Do not use floor polish or  wax that makes floors slippery. If you must use wax, use non-skid floor wax.  Do not have throw rugs and other things on the floor that can make you trip. What can I do with my stairs?  Do not leave any items on the stairs.  Make sure that there are handrails on both sides of the stairs and use them. Fix handrails that are broken or loose. Make sure that handrails are as long as the stairways.  Check any carpeting to make sure that it is firmly attached to the stairs. Fix any carpet that is loose or worn.  Avoid having throw rugs at the top or bottom of the stairs. If you do have throw rugs, attach them to the floor with carpet tape.  Make sure that you have a light switch at the top of the stairs and the bottom of the stairs. If you do not have them, ask someone to add them for you. What else can I do to help prevent falls?  Wear shoes that:  Do not have high heels.  Have rubber bottoms.  Are  comfortable and fit you well.  Are closed at the toe. Do not wear sandals.  If you use a stepladder:  Make sure that it is fully opened. Do not climb a closed stepladder.  Make sure that both sides of the stepladder are locked into place.  Ask someone to hold it for you, if possible.  Clearly mark and make sure that you can see:  Any grab bars or handrails.  First and last steps.  Where the edge of each step is.  Use tools that help you move around (mobility aids) if they are needed. These include:  Canes.  Walkers.  Scooters.  Crutches.  Turn on the lights when you go into a dark area. Replace any light bulbs as soon as they burn out.  Set up your furniture so you have a clear path. Avoid moving your furniture around.  If any of your floors are uneven, fix them.  If there are any pets around you, be aware of where they are.  Review your medicines with your doctor. Some medicines can make you feel dizzy. This can increase your chance of falling. Ask your doctor what other things that you can do to help prevent falls. This information is not intended to replace advice given to you by your health care provider. Make sure you discuss any questions you have with your health care provider. Document Released: 12/14/2008 Document Revised: 07/26/2015 Document Reviewed: 03/24/2014 Elsevier Interactive Patient Education  2017 [employer].

## 2017-07-21 NOTE — Progress Notes (Signed)
Subjective:    Patient ID: Erik Palmer, male    DOB: February 10, 1935, 83 y.o.   MRN: [id number]  Erik Palmer is a 83 y.o. male presenting on 07/21/2017 for Annual Exam  Here for Annual Physical (note patient called back after visit to confirm verbally that his insurance will cover annual physical visit today). He provides most of history, additionally provided by his wife.  HPI   Here for Annual Physical and Lab Review.  Elevated A1c Reports concern with elevated blood sugar, his trend has been A1c 5.3 to 5.5 at [facility] and now most recent 5.7. No prior readings >5.7 no prior PreDM or DM. CBGs: not checking CBG regularly Meds: never on med Currently not on ACEi / ARB Lifestyle: - Diet (admits eating sweets too often, otherwise balanced diet)  - Exercise (limited but he is improving exercise) Denies hypoglycemia  Cardiology / History of Atrial Fibrillation / HTN Has been followed by Cardiology for AFib with Zio monitor now 14 days, already on Eliquis anticoagulation, was taken off amiodarone. Follow-up in future to determine if cardiovert. Admits edema in lower extremity but it resolves overnight  Constipation Chronic problem. He was improved on Miralax in past, request refill now taking 17g or 1 capful or 1 packet daily, has soft stools but regular. Has not tried fiber supplement  Major Depression Followed by Dr Garnetta Buddy, [facility] Continues on Seroquel, tolerating well. No recent changes  History of UTI / Prostate Eval vs BPH [facility] and [facility] and DRE showed normal exam by report, he was treated for UTI. Improved on Doxazosin currently  Health Maintenance:  Prostate CA Screening: Last DRE reported normal through [facility] in 2019. Last PSA 2.7 (07/2017), previously 2.3. No known family history of prostate CA   Depression screen [facility] 2/9 07/21/2017 07/21/2017 11/25/2016  Decreased Interest 0 0 0  Down, Depressed, Hopeless 0 0 0  PHQ - 2 Score 0 0 0   Altered sleeping 0 - 0  Tired, decreased energy 0 - 0  Change in appetite 0 - 0  Feeling bad or failure about yourself  0 - 0  Trouble concentrating 0 - 0  Moving slowly or fidgety/restless 0 - 0  Suicidal thoughts 0 - 0  PHQ-9 Score 0 - 0  Difficult doing work/chores Not difficult at all - Not difficult at all    Past Medical History:  Diagnosis Date  . Angina pectoris syndrome (HCC)   . 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 Arthritis   . GERD (gastroesophageal reflux disease)   . Grief at loss of child 09/03/2014  . Hypercholesteremia   . Hypertension   . Prostate enlargement    Past Surgical History:  Procedure Laterality Date  . CORONARY ARTERY BYPASS GRAFT  03/25/2017   at [facility] [place]  . TOTAL HIP ARTHROPLASTY Right 12/05/2015   Procedure: TOTAL HIP ARTHROPLASTY ANTERIOR APPROACH;  Surgeon: Kennedy Bucker, MD;  Location: [facility] ORS;  Service: Orthopedics;  Laterality: Right;   Social History   Socioeconomic History  . Marital status: Married    Spouse name: Not on file  . Number of children: Not on file  . Years of education: Not on file  . Highest education level: Not on file  Occupational History  . Not on file  Social Needs  . Financial resource strain: Not hard at all  . Food insecurity:    Worry: Never true    Inability: Never true  .  Transportation needs:    Medical: No    Non-medical: No  Tobacco Use  . Smoking status: Never Smoker  . Smokeless tobacco: Never Used  Substance and Sexual Activity  . Alcohol use: No    Alcohol/week: 0.0 oz  . Drug use: No  . Sexual activity: Not Currently  Lifestyle  . Physical activity:    Days per week: 0 days    Minutes per session: 0 min  . Stress: Not at all  Relationships  . Social connections:    Talks on phone: Never    Gets together: More than three times a week    Attends religious service: Never    Active member of club or organization: No    Attends meetings of clubs or organizations:  Never    Relationship status: Married  . Intimate partner violence:    Fear of current or ex partner: No    Emotionally abused: No    Physically abused: No    Forced sexual activity: No  Other Topics Concern  . Not on file  Social History Narrative  . Not on file   Family History  Problem Relation Age of Onset  . Hypertension Mother   . Heart block Mother   . Stroke Mother   . Depression Mother   . Cancer Father   . Heart attack Sister   . COPD Sister   . Hypertension Brother   . Hypertension Brother   . Hypertension Brother   . Hypertension Brother   . Heart attack Brother   . Diabetes Neg Hx    Current Outpatient Medications on File Prior to Visit  Medication Sig  . acetaminophen (TYLENOL) 650 MG CR tablet Take 650 mg by mouth daily. Reported on 05/08/2015  . amLODipine (NORVASC) 10 MG tablet Take 5 mg by mouth daily.   [name] apixaban (ELIQUIS) 5 MG TABS tablet Take 5 mg by mouth 2 (two) times daily.  . carvedilol (COREG) 6.25 MG tablet Take 6.25 mg by mouth 2 (two) times daily with a meal.  . cholecalciferol (VITAMIN D) 400 units TABS tablet Take 400 Units by mouth daily.  [name] doxazosin (CARDURA) 2 MG tablet Take 1 tablet (2 mg total) by mouth 2 (two) times daily.  [name] omeprazole (PRILOSEC) 20 MG capsule TAKE 1 CAPSULE BY MOUTH TWICE A DAY  . QUEtiapine (SEROQUEL) 50 MG tablet Take 1 tablet (50 mg total) by mouth at bedtime.   No current facility-administered medications on file prior to visit.     Review of Systems  Constitutional: Negative for activity change, appetite change, chills, diaphoresis, fatigue and fever.  HENT: Negative for congestion, hearing loss and sinus pressure.   Eyes: Negative for visual disturbance.  Respiratory: Negative for apnea, cough, choking, chest tightness, shortness of breath and wheezing.   Cardiovascular: Negative for chest pain, palpitations and leg swelling.  Gastrointestinal: Negative for abdominal pain, anal bleeding, blood in stool,  constipation, diarrhea, nausea and vomiting.  Endocrine: Negative for cold intolerance.  Genitourinary: Negative for decreased urine volume, difficulty urinating, dysuria, frequency, hematuria, testicular pain and urgency.  Musculoskeletal: Negative for arthralgias, back pain and neck pain.  Skin: Negative for rash.  Allergic/Immunologic: Negative for environmental allergies.  Neurological: Negative for dizziness, weakness, light-headedness, numbness and headaches.  Hematological: Negative for adenopathy.  Psychiatric/Behavioral: Negative for behavioral problems, dysphoric mood and sleep disturbance. The patient is not nervous/anxious.    Per HPI unless specifically indicated above     Objective:    BP [name])  144/68 (BP Location: Left Arm, Cuff Size: Normal)   Pulse 84   Temp 98.1 F (36.7 C) (Oral)   Resp 16   Ht  (1.753 m)   Wt 238 lb 9.6 oz (108.2 kg)   BMI 35.24 kg/m   Wt Readings from Last 3 Encounters:  07/21/17 238 lb 9.6 oz (108.2 kg)  07/21/17 238 lb 9.6 oz (108.2 kg)  05/25/17 229 lb 9.6 oz (104.1 kg)    Physical Exam  Constitutional: He is oriented to person, place, and time. He appears well-developed and well-nourished. No distress.  Well-appearing, comfortable, cooperative  HENT:  Head: Normocephalic and atraumatic.  Mouth/Throat: Oropharynx is clear and moist.  Eyes: Pupils are equal, round, and reactive to light. Conjunctivae and EOM are normal. Right eye exhibits no discharge. Left eye exhibits no discharge.  Neck: Normal range of motion. Neck supple. No thyromegaly present.  Cardiovascular: Normal rate, normal heart sounds and intact distal pulses.  No murmur heard. Mild ectopy on auscultation, seems to be irregularly irregular, has Zio patch on  Pulmonary/Chest: Effort normal and breath sounds normal. No respiratory distress. He has no wheezes. He has no rales.  Abdominal: Soft. Bowel sounds are normal. He exhibits no distension and no mass. There is no  tenderness.  Musculoskeletal: Normal range of motion. He exhibits edema (Stable mild trace to +1 pitting edema bilateral ankles, feet, has compresson stockings on). He exhibits no tenderness.  Upper / Lower Extremities: - Normal muscle tone, strength bilateral upper extremities 5/5, lower extremities 5/5  Lymphadenopathy:    He has no cervical adenopathy.  Neurological: He is alert and oriented to person, place, and time.  Distal sensation intact to light touch all extremities  Skin: Skin is warm and dry. No rash noted. He is not diaphoretic. No erythema.  Psychiatric: He has a normal mood and affect. His behavior is normal.  Well groomed, good eye contact, normal speech and thoughts  Nursing note and vitals reviewed.  Results for orders placed or performed in visit on 07/16/17  PSA, Total with Reflex to PSA, Free  Result Value Ref Range   PSA, Total 2.7 < OR = 4.0 ng/mL  Lipid panel  Result Value Ref Range   Cholesterol 170 <200 mg/dL   HDL 44 >16 mg/dL   Triglycerides 109 (H) <150 mg/dL   LDL Cholesterol (Calc) 96 mg/dL (calc)   Total CHOL/HDL Ratio 3.9 <5.0 (calc)   Non-HDL Cholesterol (Calc) 126 <130 mg/dL (calc)  CBC with Differential/Platelet  Result Value Ref Range   WBC 6.0 3.8 - 10.8 Thousand/uL   RBC 3.69 (L) 4.20 - 5.80 Million/uL   Hemoglobin 10.6 (L) 13.2 - 17.1 g/dL   HCT 60.4 (L) 54.0 - 98.1 %   MCV 87.0 80.0 - 100.0 fL   MCH 28.7 27.0 - 33.0 pg   MCHC 33.0 32.0 - 36.0 g/dL   RDW 19.1 47.8 - 29.5 %   Platelets 297 140 - 400 Thousand/uL   MPV 10.2 7.5 - 12.5 fL   Neutro Abs 3,576 1,500 - 7,800 cells/uL   Lymphs Abs 1,788 850 - 3,900 cells/uL   WBC mixed population 498 200 - 950 cells/uL   Eosinophils Absolute 120 15 - 500 cells/uL   Basophils Absolute 18 0 - 200 cells/uL   Neutrophils Relative % 59.6 %   Total Lymphocyte 29.8 %   Monocytes Relative 8.3 %   Eosinophils Relative 2.0 %   Basophils Relative 0.3 %  Hemoglobin A1c  Result Value  Ref Range   Hgb  A1c MFr Bld 5.7 (H) <5.7 % of total Hgb   Mean Plasma Glucose 117 (calc)   eAG (mmol/L) 6.5 (calc)  COMPLETE METABOLIC PANEL WITH GFR  Result Value Ref Range   Glucose, Bld 94 65 - 99 mg/dL   BUN 15 7 - 25 mg/dL   Creat 1.61 0.96 - 0.45 mg/dL   GFR, Est Non African American 84 > OR = 60 mL/min/1.36m2   GFR, Est African American 97 > OR = 60 mL/min/1.39m2   BUN/Creatinine Ratio NOT APPLICABLE 6 - 22 (calc)   Sodium 141 135 - 146 mmol/L   Potassium 3.9 3.5 - 5.3 mmol/L   Chloride 106 98 - 110 mmol/L   CO2 27 20 - 32 mmol/L   Calcium 9.1 8.6 - 10.3 mg/dL   Total Protein 6.4 6.1 - 8.1 g/dL   Albumin 4.1 3.6 - 5.1 g/dL   Globulin 2.3 1.9 - 3.7 g/dL (calc)   AG Ratio 1.8 1.0 - 2.5 (calc)   Total Bilirubin 0.4 0.2 - 1.2 mg/dL   Alkaline phosphatase (APISO) 70 40 - 115 U/L   AST 11 10 - 35 U/L   ALT 10 9 - 46 U/L      Assessment & Plan:   Problem List Items Addressed This Visit    Anxiety disorder due to general medical condition   BPH with obstruction/lower urinary tract symptoms    Stable to mildly improved chronic BPH with LUTS primarily nocturia. Limited management in past - AUA BPH score elevated in past >20 - Last PSA 2.7 (07/2017) - [facility] - No known personal/family history of prostate CA - fam history of brother with prostate biopsy/BPH  Plan: 1. Continue current dose Doxazosin  BID for now - cannot tolerate higher dose up to  per 2. Future may need alternative option such as Finasteride - discuss further with Urology      Elevated hemoglobin A1c    New abnormal elevated A1c up to 5.7, previous gradual increase 5.3 to 5.5 Concern with obesity, HTN, HLD  Plan:  1. Not on any therapy currently  2. Encourage improved lifestyle - low carb, low sugar diet, reduce portion size, continue improving regular exercise - handout on carb glycemic given 3. Follow-up 6 months A1c POC check      Essential hypertension    Improved but still elevated  BP with home checks now 130-140 overall better control No known complications Failed HCTZ (hyponatremia)  Plan: 1. Continue Amlodipine  daily, Carvedilol 6.25mg  BID, Doxazosin  BID 2. Closely watch BP at home, monitor regularly, notify office if elevated BP >140/90 persistently - discussion on goal BP < 150, despite patient not interested in lower BP again as he experienced in past, he is comfortable with lesser control and understands risks 4. Encouraged will need to work on gradual improving exercise, continue hydration, low sodium diet 5. Follow-up 6 months HTN med adjust      Relevant Medications   atorvastatin (LIPITOR) 40 MG tablet   Hyperlipidemia    Controlled cholesterol on statin and lifestyle except mild elevated TG Last lipid panel 07/2017 Calculated ASCVD 10 yr risk score elevated  Plan: 1. Continue current meds - Atorvastatin  daily 2. Continue ASA  for primary ASCVD risk reduction 3. Encourage improved lifestyle - low carb/cholesterol, reduce portion size, continue improving regular exercise      Relevant Medications   atorvastatin (LIPITOR) 40 MG tablet   Insomnia  Obesity (BMI 35.0-39.9 without comorbidity)    Weight gain +9-10 lbs in 2=3 months Encourage continue improving diet, since limited exercise       Other Visit Diagnoses    Annual physical exam    -  Primary Updated health maintenance Reviewed lab results Reviewed outside record from [place] Encourage keep improving diet and lifestyle exercise, control A1c and wt loss Follow-up    Other constipation       Relevant Medications   polyethylene glycol powder (GLYCOLAX/MIRALAX) powder      Meds ordered this encounter  Medications  . atorvastatin (LIPITOR) 40 MG tablet    Sig: Take 1 tablet (40 mg total) by mouth at bedtime.    Dispense:  90 tablet    Refill:  3  . polyethylene glycol powder (GLYCOLAX/MIRALAX) powder    Sig: Take 17 g by mouth daily.    Dispense:  500 g    Refill:   11    Please fill monthly bottle supply amount    Follow up plan: Return in about 6 months (around 01/21/2018) for PreDM A1c, HTN, Cardiology update.  Saralyn Pilar, DO [facility] 07/22/2017, 1:10 AM

## 2017-07-21 NOTE — Patient Instructions (Addendum)
Thank you for coming to the office today.  Elevated A1c sugar on average from 5.3 >> 5.5 >> now 5.7 - of Pre-Diabetes (>5.7 to 6.4)  Recommend trying OTC Miralax 17g = 1 capful in large glass water once daily - sent rx to pharmacy  Other more natural remedies or preventative treatment: - Increase hydration with water - Increase fiber in diet (high fiber foods = vegetables, leafy greens, oats/grains) - May take OTC Fiber supplement (metamucil powder or pill/gummy) - May try OTC Probiotic  Please schedule a Follow-up Appointment to: Return in about 6 months (around 01/21/2018) for PreDM A1c, HTN, Cardiology update.  If you have any other questions or concerns, please feel free to call the office or send a message through MyChart. You may also schedule an earlier appointment if necessary.  Additionally, you may be receiving a survey about your experience at our office within a few days to 1 week by e-mail or mail. We value your feedback.  Saralyn Pilar, DO [facility], [place]

## 2017-07-22 NOTE — Assessment & Plan Note (Signed)
Improved but still elevated BP with home checks now 130-140 overall better control No known complications Failed HCTZ (hyponatremia)  Plan: 1. Continue Amlodipine  daily, Carvedilol 6.25mg  BID, Doxazosin  BID 2. Closely watch BP at home, monitor regularly, notify office if elevated BP >140/90 persistently - discussion on goal BP < 150, despite patient not interested in lower BP again as he experienced in past, he is comfortable with lesser control and understands risks 4. Encouraged will need to work on gradual improving exercise, continue hydration, low sodium diet 5. Follow-up 6 months HTN med adjust

## 2017-07-22 NOTE — Assessment & Plan Note (Signed)
New abnormal elevated A1c up to 5.7, previous gradual increase 5.3 to 5.5 Concern with obesity, HTN, HLD  Plan:  1. Not on any therapy currently  2. Encourage improved lifestyle - low carb, low sugar diet, reduce portion size, continue improving regular exercise - handout on carb glycemic given 3. Follow-up 6 months A1c POC check

## 2017-07-22 NOTE — Assessment & Plan Note (Signed)
Stable to mildly improved chronic BPH with LUTS primarily nocturia. Limited management in past - AUA BPH score elevated in past >20 - Last PSA 2.7 (07/2017) - [facility] - No known personal/family history of prostate CA - fam history of brother with prostate biopsy/BPH  Plan: 1. Continue current dose Doxazosin  BID for now - cannot tolerate higher dose up to  per 2. Future may need alternative option such as Finasteride - discuss further with Urology

## 2017-07-22 NOTE — Assessment & Plan Note (Signed)
Weight gain +9-10 lbs in 2=3 months Encourage continue improving diet, since limited exercise

## 2017-07-22 NOTE — Assessment & Plan Note (Signed)
Controlled cholesterol on statin and lifestyle except mild elevated TG Last lipid panel 07/2017 Calculated ASCVD 10 yr risk score elevated  Plan: 1. Continue current meds - Atorvastatin  daily 2. Continue ASA  for primary ASCVD risk reduction 3. Encourage improved lifestyle - low carb/cholesterol, reduce portion size, continue improving regular exercise

## 2017-07-23 ENCOUNTER — Encounter: Payer: Self-pay | Admitting: Family Medicine

## 2017-09-12 ENCOUNTER — Other Ambulatory Visit: Payer: Self-pay | Admitting: Family Medicine

## 2017-11-23 ENCOUNTER — Ambulatory Visit: Payer: Medicare Other | Admitting: Psychiatry

## 2017-11-23 ENCOUNTER — Other Ambulatory Visit: Payer: Self-pay

## 2017-11-23 ENCOUNTER — Encounter: Payer: Self-pay | Admitting: Psychiatry

## 2017-11-23 VITALS — BP 176/83 | HR 83 | Temp 97.4°F | Wt 250.4 lb

## 2017-11-23 DIAGNOSIS — F411 Generalized anxiety disorder: Secondary | ICD-10-CM | POA: Diagnosis not present

## 2017-11-23 DIAGNOSIS — F330 Major depressive disorder, recurrent, mild: Secondary | ICD-10-CM | POA: Diagnosis not present

## 2017-11-23 MED ORDER — QUETIAPINE FUMARATE 50 MG PO TABS: 50.0000 mg | ORAL_TABLET | Freq: Every day | ORAL | 1 refills | Status: DC

## 2017-11-23 NOTE — Progress Notes (Signed)
Psychiatric MD/NP Follow up Note  Patient Identification: Crista ElliotWalter D Retter MRN:  [id number] Date of Evaluation:  11/23/2017 [facility] Chief Complaint:   Chief Complaint    Follow-up; Medication Refill; Weight Gain     Visit Diagnosis:    ICD-10-CM   1. MDD (major depressive disorder), recurrent episode, mild (HCC) F33.0   2. GAD (generalized anxiety disorder) F41.1    Diagnosis:   Patient Active Problem List   Diagnosis Date Noted  . Anemia [D64.9] 11/25/2016  . BPH with obstruction/lower urinary tract symptoms [N40.1, N13.8] 07/22/2016  . Screening for prostate cancer [Z12.5] 07/22/2016  . Elevated hemoglobin A1c [R73.09] 07/22/2016  . SIADH (syndrome of inappropriate ADH production) (HCC) [E22.2] 01/08/2016  . Insomnia [G47.00] 12/28/2015  . Fracture of unspecified part of neck of right femur, subsequent encounter for closed fracture with routine healing [S72.001D] 12/27/2015  . Closed right hip fracture, initial encounter (HCC) [S72.001A] 12/04/2015  . Hyperlipidemia [E78.5] 05/08/2015  . Essential hypertension [I10] 02/13/2015  . Obesity (BMI 35.0-39.9 without comorbidity) [E66.9] 12/22/2014  . Acute anxiety [F41.9] 09/12/2014  . Grief at loss of child [F43.21, Z63.4] 09/03/2014  . Anxiety disorder due to general medical condition [F06.4] 09/03/2014   History of Present Illness:    Patient is a 83 -year-old male who presented with his wife for follow up.  He was seen in the office 6 months ago.  He reported that he has been doing well and no acute symptoms noted at this time.  He reported that he has been compliant with his medications.  He enjoyed his summer with his wife and they have been to the beach multiple times as they have the camper in the beach.  He was discussing in detail about his camper.  He reported that the about the camper in 2001 and they have been using it multiple times going to trips with his wife.  Patient reported that his  wife enjoyed the trips to the beach.  He remains pleasant and cooperative throughout the interview.  His wife remains supportive.  He has been sleeping well with the help of the Seroquel.  No acute symptoms noted at this time.    Wife reported that he has been continued on the Eliquis according to his physician.  He also wants to continue on Seroquel as prescribed.  Denied side effects of the medications.        He currently denied having any suicidal homicidal ideations or plans.    His mood symptoms are under control and he appears calm and collective during the interview.      Past Medical History:  Past Medical History:  Diagnosis Date  . Angina pectoris syndrome (HCC)   . 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 Arthritis   . GERD (gastroesophageal reflux disease)   . Grief at loss of child 09/03/2014  . Hypercholesteremia   . Hypertension   . Prostate enlargement     Past Surgical History:  Procedure Laterality Date  . [facility]  . TOTAL HIP ARTHROPLASTY Right 12/05/2015   Procedure: TOTAL HIP ARTHROPLASTY ANTERIOR APPROACH;  Surgeon: Kennedy BuckerMichael Menz, MD;  Location: [facility] ORS;  Service: Orthopedics;  Laterality: Right;   Family History:  Family History  Problem Relation Age of Onset  . Hypertension Mother   . Heart block Mother   . Stroke Mother   . Depression Mother   . Cancer Father   .  Heart attack Sister   . COPD Sister   . Hypertension Brother   . Hypertension Brother   . Hypertension Brother   . Hypertension Brother   . Heart attack Brother   . Diabetes Neg Hx    Social History:   Social History   Socioeconomic History  . Marital status: Married    Spouse name: Not on file  . Number of children: Not on file  . Years of education: Not on file  . Highest education level: Not on file  Occupational History  . Not on file  Social Needs  . Financial resource strain: Not hard at all  . Food  insecurity:    Worry: Never true    Inability: Never true  . Transportation needs:    Medical: No    Non-medical: No  Tobacco Use  . Smoking status: Never Smoker  . Smokeless tobacco: Never Used  Substance and Sexual Activity  . Alcohol use: No    Alcohol/week: 0.0 standard drinks  . Drug use: No  . Sexual activity: Not Currently  Lifestyle  . Physical activity:    Days per week: 0 days    Minutes per session: 0 min  . Stress: Not at all  Relationships  . Social connections:    Talks on phone: Never    Gets together: More than three times a week    Attends religious service: Never    Active member of club or organization: No    Attends meetings of clubs or organizations: Never    Relationship status: Married  Other Topics Concern  . Not on file  Social History Narrative  . Not on file   Additional Social History:   Currently married and lives with his wife. He had 3 children and one of them passed away. She was in [employer] and is currently retired.  Musculoskeletal: Strength & Muscle Tone: within normal limits Gait & Station: normal Patient leans: Right  Psychiatric Specialty Exam: Medication Refill     Review of Systems  Psychiatric/Behavioral: Positive for depression. The patient is nervous/anxious and has insomnia.   All other systems reviewed and are negative.   Blood pressure (!) 176/83, pulse 83, temperature (!) 97.4 F (36.3 C), temperature source Oral, weight 250 lb 6.4 oz (113.6 kg).Body mass index is 36.98 kg/m.  General Appearance: Casual and Fairly Groomed  Eye Contact:  Fair  Speech:  Clear and Coherent and Normal Rate  Volume:  Normal  Mood:  Euthymic  Affect:  Congruent  Thought Process:  Coherent  Orientation:  Full (Time, Place, and Person)  Thought Content:  WDL  Suicidal Thoughts:  No  Homicidal Thoughts:  No  Memory:  Immediate;   Fair  Judgement:  Fair  Insight:  Fair  Psychomotor Activity:  Normal  Concentration:  Fair  Recall:   [employer] of Knowledge:Fair  Language: Fair  Akathisia:  No  Handed:  Right  AIMS (if indicated):    Assets:  Communication Skills Desire for Improvement Housing Intimacy Physical Health Social Support  ADL's:  Intact  Cognition: WNL  Sleep:  normal     Allergies:   Allergies  Allergen Reactions  . Solu-Medrol [Methylprednisolone Acetate] Anxiety   Current Medications: Current Outpatient Medications  Medication Sig Dispense Refill  . acetaminophen (TYLENOL) 650 MG CR tablet Take 650 mg by mouth daily. Reported on 05/08/2015    . amLODipine (NORVASC) 10 MG tablet Take 5 mg by mouth daily.     [name]  apixaban (ELIQUIS) 5 MG TABS tablet Take 5 mg by mouth 2 (two) times daily.    [name] aspirin EC 81 MG tablet Take 81 mg by mouth daily.    [name] atorvastatin (LIPITOR) 40 MG tablet Take 1 tablet (40 mg total) by mouth at bedtime. 90 tablet 3  . carvedilol (COREG) 6.25 MG tablet Take 6.25 mg by mouth 2 (two) times daily with a meal.    . cholecalciferol (VITAMIN D) 400 units TABS tablet Take 400 Units by mouth daily.    [name] doxazosin (CARDURA) 2 MG tablet Take 1 tablet (2 mg total) by mouth 2 (two) times daily. 180 tablet 3  . fluticasone (FLONASE) 50 MCG/ACT nasal spray INHALE 2 SPRAYS INTO EACH NOSTRIL EVERY DAY 48 g 1  . omeprazole (PRILOSEC) 20 MG capsule TAKE 1 CAPSULE BY MOUTH TWICE A DAY 60 capsule 5  . polyethylene glycol powder (GLYCOLAX/MIRALAX) powder Take 17 g by mouth daily. 500 g 11  . QUEtiapine (SEROQUEL) 50 MG tablet Take 1 tablet (50 mg total) by mouth at bedtime. 90 tablet 1   No current facility-administered medications for this visit.     Previous Psychotropic Medications: Received Soulmedrol in 2014 led  to his anxiety symptoms Valium and Prozac -2014 by Dr Juanetta Gosling  Prozac again in this summer Celexa prescribed by Dr Juanetta Gosling September 04 2014- anxiety and depression.  Insomnia and takes Meclizine, Xanax and Motrin PM to help him sleep.  Dr Juanetta Gosling started Nortriptyline for  sleep, did not help.   Substance Abuse History in the last 12 months:  No.  Consequences of Substance Abuse: Negative NA  Medical Decision Making:  Review of Psycho-Social Stressors (1) and Review and summation of old records (2)  Treatment Plan Summary: Medication management   Discussed with patient what the medications and he will continue on the following medications  Mood symptoms/ sleep Continue Seroquel 50 mg by mouth daily at bedtime and was given 90 day supply of the medications Discussed with him in detail about the medication dose benefits and alternatives.   Follow-up in 6  month or earlier    This note was generated in part or whole with voice recognition software. Voice regonition is usually quite accurate but there are transcription errors that can and very often do occur. I apologize for any typographical errors that were not detected and corrected.    Brandy Hale, MD

## 2017-11-29 ENCOUNTER — Other Ambulatory Visit: Payer: Self-pay | Admitting: Family Medicine

## 2017-12-02 ENCOUNTER — Other Ambulatory Visit: Payer: Self-pay | Admitting: Family Medicine

## 2017-12-02 DIAGNOSIS — N138 Other obstructive and reflux uropathy: Secondary | ICD-10-CM

## 2017-12-02 DIAGNOSIS — I10 Essential (primary) hypertension: Secondary | ICD-10-CM

## 2017-12-02 DIAGNOSIS — N401 Enlarged prostate with lower urinary tract symptoms: Secondary | ICD-10-CM

## 2018-01-21 ENCOUNTER — Ambulatory Visit (INDEPENDENT_AMBULATORY_CARE_PROVIDER_SITE_OTHER): Payer: Medicare Other | Admitting: Family Medicine

## 2018-01-21 ENCOUNTER — Encounter: Payer: Self-pay | Admitting: Family Medicine

## 2018-01-21 VITALS — BP 172/76 | HR 72 | Temp 97.8°F | Resp 16 | Ht 69.0 in | Wt 245.6 lb

## 2018-01-21 DIAGNOSIS — I4821 Permanent atrial fibrillation: Secondary | ICD-10-CM

## 2018-01-21 DIAGNOSIS — N3281 Overactive bladder: Secondary | ICD-10-CM | POA: Diagnosis not present

## 2018-01-21 DIAGNOSIS — N401 Enlarged prostate with lower urinary tract symptoms: Secondary | ICD-10-CM

## 2018-01-21 DIAGNOSIS — N138 Other obstructive and reflux uropathy: Secondary | ICD-10-CM

## 2018-01-21 DIAGNOSIS — R7309 Other abnormal glucose: Secondary | ICD-10-CM | POA: Diagnosis not present

## 2018-01-21 LAB — POCT GLYCOSYLATED HEMOGLOBIN (HGB A1C): HEMOGLOBIN A1C: 5.6 % (ref 4.0–5.6)

## 2018-01-21 MED ORDER — MIRABEGRON ER 25 MG PO TB24: 25.0000 mg | ORAL_TABLET | Freq: Every day | ORAL | 2 refills | Status: DC

## 2018-01-21 NOTE — Patient Instructions (Addendum)
Thank you for coming to the office today.  Try Myrbetriq 25mg  daily for bladder urgency. Call if you have any issues with it.  If not improving, can try Finasteride or talk with Urologist in future.  A1c 5.6  DUE for FASTING BLOOD WORK (no food or drink after midnight before the lab appointment, only water or coffee without cream/sugar on the morning of)  SCHEDULE "Lab Only" visit in the morning at the clinic for lab draw in 6 MONTHS   - Make sure Lab Only appointment is at about 1 week before your next appointment, so that results will be available  For Lab Results, once available within 2-3 days of blood draw, you can can log in to MyChart online to view your results and a brief explanation. Also, we can discuss results at next follow-up visit.   Please schedule a Follow-up Appointment to: Return in about 6 months (around 07/22/2018) for Annual Physical.  If you have any other questions or concerns, please feel free to call the office or send a message through MyChart. You may also schedule an earlier appointment if necessary.  Additionally, you may be receiving a survey about your experience at our office within a few days to 1 week by e-mail or mail. We value your feedback.  Saralyn PilarAlexander Dacota Devall, DO [facility]

## 2018-01-21 NOTE — Progress Notes (Signed)
Subjective:    Patient ID: Erik Palmer, male    DOB: November 08, 1934, 83 y.o.   MRN: [id number]  Erik Palmer is a 83 y.o. male presenting on 01/21/2018 for No chief complaint on file.   HPI   Elevated A1c Now reading 5.6, previously 5.3 to 5.7 CBGs: not checking CBG regularly Meds: never on med Currently not on ACEi / ARB Lifestyle: - Diet (admits eating sweets too often, otherwise balanced diet)  - Exercise (limited but he is improving exercise) Denies hypoglycemia  Cardiology / History of Atrial Fibrillation / HTN Previously dx with chronic AFibt, cardiology has recommended to continue eliquis for anticoag  BPH with LUTS [facility] and [facility] and DRE showed normal exam by report, he was treated for UTI. Improved on Doxazosin currently but still complaints of significant LUTS. - He is asking for bladder spasm medication, already on alpha blocker. And he says was told prostate is not too large. Considered Finasteride. - [facility] other [facility] him  Health Maintenance:  Prostate CA Screening: [facility]. Last PSA 2.7 (07/2017), previously 2.3. No known family history of prostate CA  Health Maintenance: Due for Flu Shot, declines today despite counseling on benefits   [facility] 07/21/2017 07/21/2017 11/25/2016  Decreased Interest 0 0 0  Down, Depressed, Hopeless 0 0 0  PHQ - 2 Score 0 0 0  Altered sleeping 0 - 0  Tired, decreased energy 0 - 0  Change in appetite 0 - 0  Feeling bad or failure about yourself  0 - 0  Trouble concentrating 0 - 0  Moving slowly or fidgety/restless 0 - 0  Suicidal thoughts 0 - 0  PHQ-9 Score 0 - 0  Difficult doing work/chores Not difficult at all - Not difficult at all    Social History   Tobacco Use  . Smoking status: Never Smoker  . Smokeless tobacco: Never Used  Substance Use Topics  . Alcohol use: No   Alcohol/week: 0.0 standard drinks  . Drug use: No    Review of Systems Per HPI unless specifically indicated above     Objective:    BP (!) 172/76   Pulse 72   Temp 97.8 F (36.6 C) (Oral)   Resp 16   Ht 5\' 9"  (1.753 m)   Wt 245 lb 9.6 oz (111.4 kg)   BMI 36.27 kg/m   Wt Readings from Last 3 Encounters:  01/21/18 245 lb 9.6 oz (111.4 kg)  07/21/17 238 lb 9.6 oz (108.2 kg)  07/21/17 238 lb 9.6 oz (108.2 kg)    Physical Exam  Constitutional: He is oriented to person, place, and time. He appears well-developed and well-nourished. No distress.  Well-appearing, comfortable, cooperative  HENT:  Head: Normocephalic and atraumatic.  Mouth/Throat: Oropharynx is clear and moist.  Eyes: Conjunctivae are normal. Right eye exhibits no discharge. Left eye exhibits no discharge.  Neck: Normal range of motion. Neck supple. No thyromegaly present.  Cardiovascular: Normal rate, normal heart sounds and intact distal pulses.  No murmur heard. Irregularly irregular  Pulmonary/Chest: Effort normal and breath sounds normal. No respiratory distress. He has no wheezes. He has no rales.  Musculoskeletal: Normal range of motion. He exhibits no edema.  Lymphadenopathy:    He has no cervical adenopathy.  Neurological: He is alert and oriented to person, place, and time.  Skin: Skin is warm and dry.  No rash noted. He is not diaphoretic. No erythema.  Psychiatric: He has a normal mood and affect. His behavior is normal.  Well groomed, good eye contact, normal speech and thoughts  Nursing note and vitals reviewed.  Results for orders placed or performed in visit on 01/21/18  POCT HgB A1C  Result Value Ref Range   Hemoglobin A1C 5.6 4.0 - 5.6 %      Assessment & Plan:   Problem List Items Addressed This Visit    Atrial fibrillation (HCC) Followed by Cardiology On anticoagulation    BPH with obstruction/lower urinary tract symptoms - Primary See A&P Below   Relevant Medications    mirabegron ER (MYRBETRIQ) 25 MG TB24 tablet   Elevated hemoglobin A1c Stable A1c currently Reviewed PreDM Encourage lifestyle improvement - patient hesitant to focus on lower carb diet     Relevant Orders   POCT HgB A1C (Completed)    Other Visit Diagnoses    OAB (overactive bladder)     Clinically symptoms of LUTS w/ OAB vs BPH, however [facility] told him not significantly enlarged prostate. Already on alpha blocker doxazosin, has refractory symptoms  Plan Start trial Myrbetriq Caution other anti cholinergic agents d/t side effects in age 12 patient Future would switch or add Finasteride for trial for BPH if needed [facility] because I am uncertain why they did not offer other treatment for his LUTS that he is complaining of.   Relevant Medications   mirabegron ER (MYRBETRIQ) 25 MG TB24 tablet      Meds ordered this encounter  Medications  . mirabegron ER (MYRBETRIQ) 25 MG TB24 tablet    Sig: Take 1 tablet (25 mg total) by mouth daily.    Dispense:  30 tablet    Refill:  2      Follow up plan: Return in about 6 months (around 07/22/2018) for Annual Physical.  Future labs to be ordered for 07/2018  Erik Pilar, DO [facility] 01/21/2018, 8:44 AM

## 2018-01-22 DIAGNOSIS — I4891 Unspecified atrial fibrillation: Secondary | ICD-10-CM | POA: Insufficient documentation

## 2018-01-24 ENCOUNTER — Other Ambulatory Visit: Payer: Self-pay | Admitting: Family Medicine

## 2018-01-24 DIAGNOSIS — Z0000 Encounter for general adult medical examination without abnormal findings: Secondary | ICD-10-CM

## 2018-01-24 DIAGNOSIS — E669 Obesity, unspecified: Secondary | ICD-10-CM

## 2018-01-24 DIAGNOSIS — D509 Iron deficiency anemia, unspecified: Secondary | ICD-10-CM

## 2018-01-24 DIAGNOSIS — R7309 Other abnormal glucose: Secondary | ICD-10-CM

## 2018-01-24 DIAGNOSIS — N138 Other obstructive and reflux uropathy: Secondary | ICD-10-CM

## 2018-01-24 DIAGNOSIS — E782 Mixed hyperlipidemia: Secondary | ICD-10-CM

## 2018-01-24 DIAGNOSIS — F5104 Psychophysiologic insomnia: Secondary | ICD-10-CM

## 2018-01-24 DIAGNOSIS — I4821 Permanent atrial fibrillation: Secondary | ICD-10-CM

## 2018-01-24 DIAGNOSIS — I10 Essential (primary) hypertension: Secondary | ICD-10-CM

## 2018-01-24 DIAGNOSIS — N401 Enlarged prostate with lower urinary tract symptoms: Secondary | ICD-10-CM

## 2018-02-23 ENCOUNTER — Other Ambulatory Visit: Payer: Self-pay | Admitting: Nurse Practitioner

## 2018-02-23 DIAGNOSIS — I10 Essential (primary) hypertension: Secondary | ICD-10-CM

## 2018-02-23 DIAGNOSIS — N138 Other obstructive and reflux uropathy: Secondary | ICD-10-CM

## 2018-02-23 DIAGNOSIS — N401 Enlarged prostate with lower urinary tract symptoms: Secondary | ICD-10-CM

## 2018-03-05 DIAGNOSIS — H2513 Age-related nuclear cataract, bilateral: Secondary | ICD-10-CM | POA: Diagnosis not present

## 2018-03-24 ENCOUNTER — Other Ambulatory Visit: Payer: Self-pay | Admitting: Nurse Practitioner

## 2018-03-24 DIAGNOSIS — N138 Other obstructive and reflux uropathy: Secondary | ICD-10-CM

## 2018-03-24 DIAGNOSIS — N401 Enlarged prostate with lower urinary tract symptoms: Secondary | ICD-10-CM

## 2018-03-24 DIAGNOSIS — I10 Essential (primary) hypertension: Secondary | ICD-10-CM

## 2018-05-03 IMAGING — CT CT CERVICAL SPINE W/O CM
4 of 7 series · 14 of 33 positions shown, 15 images · non-contrast
Comparison: 11/02/2012 CT head without contrast

CLINICAL DATA: Fall from ladder, trauma, injury

EXAM:
CT HEAD WITHOUT CONTRAST
CT CERVICAL SPINE WITHOUT CONTRAST
TECHNIQUE: Multidetector CT imaging of the head and cervical spine was
performed following the standard protocol without intravenous
contrast. Multiplanar CT image reconstructions of the cervical spine
were also generated.

[Series 4: coronal soft tissue · coronal · 0.33mm/px · 2 of 70 slices shown]
[im 24/70  bone]
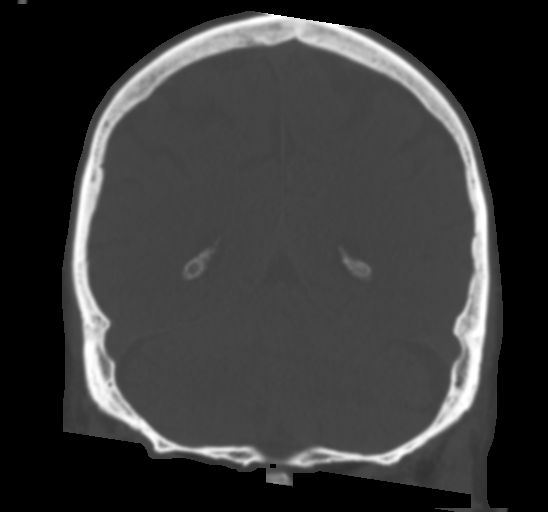
[im 47/70  bone]
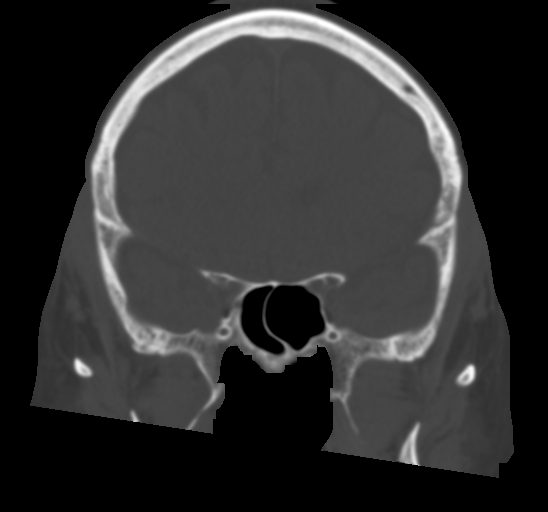

[Series 7: c spine soft · axial · 0.32mm/px · z∈[+48,+162]mm · 4 of 97 slices shown]
[im 20/97  soft-tissue]
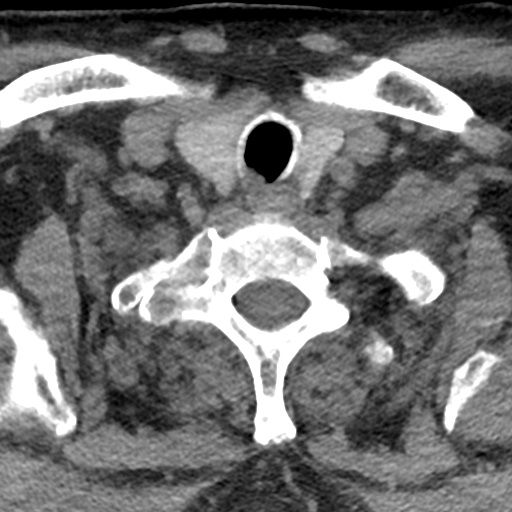
[im 39/97  soft-tissue]
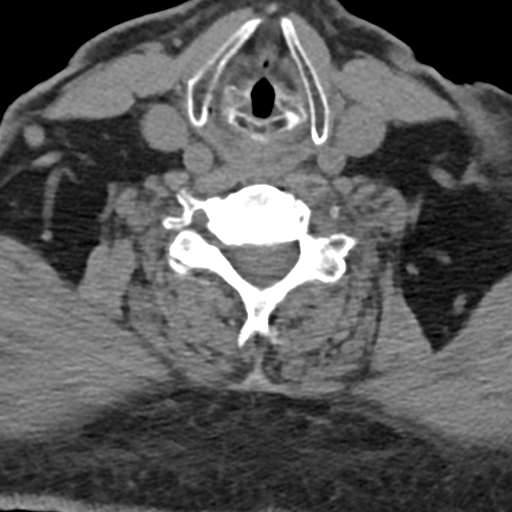
[im 58/97  soft-tissue]
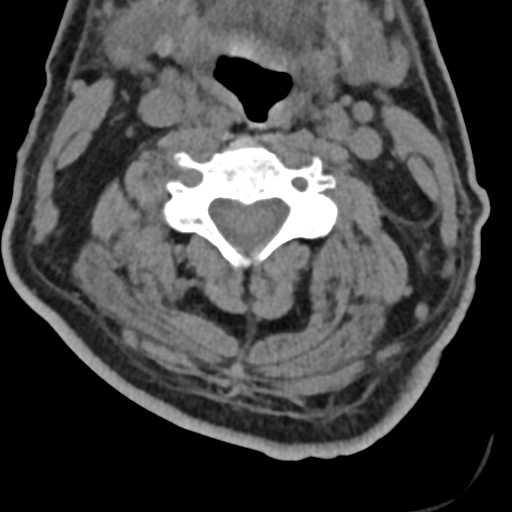
[im 77/97  soft-tissue]
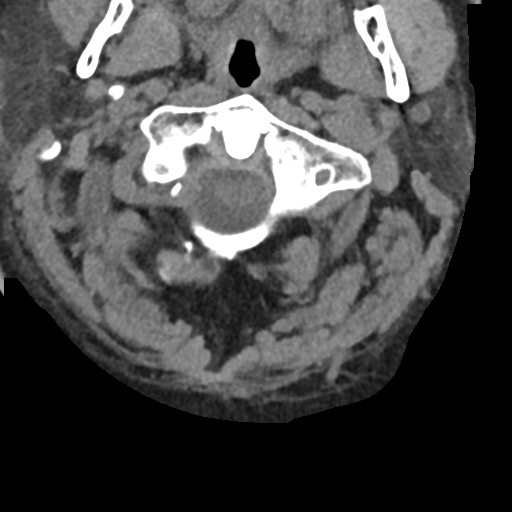

[Series 8: sagittal bone · sagittal · 0.28mm/px · 4 of 42 slices shown]
[im 9/42  bone]
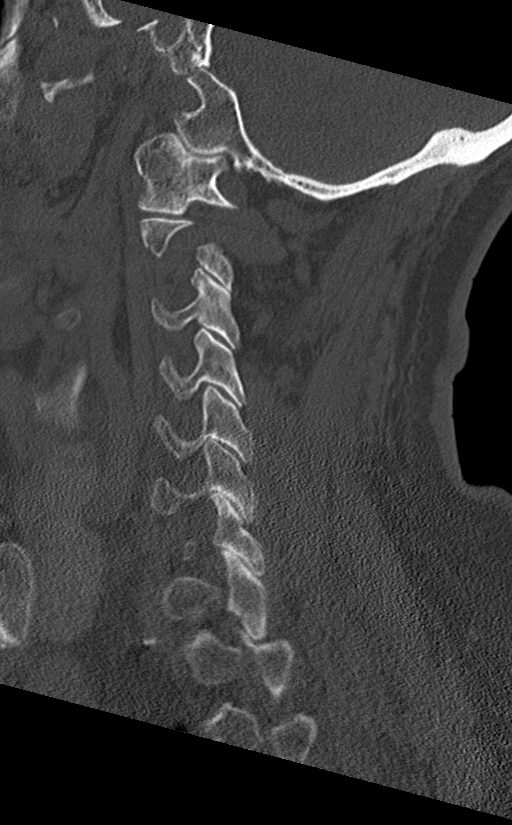
[im 17/42  bone]
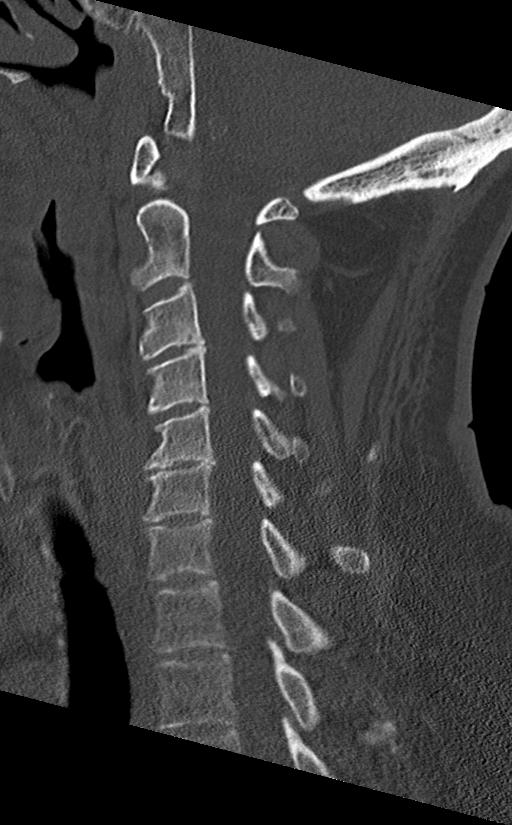
[im 25/42  bone]
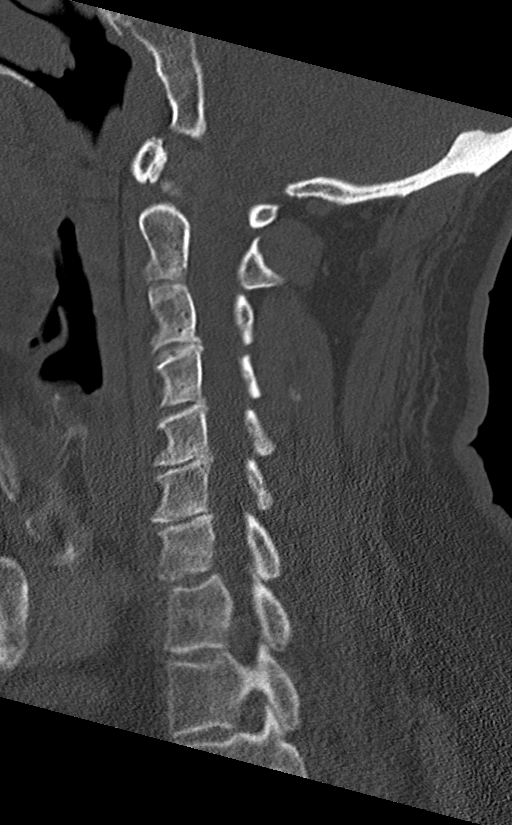
[im 33/42  bone]
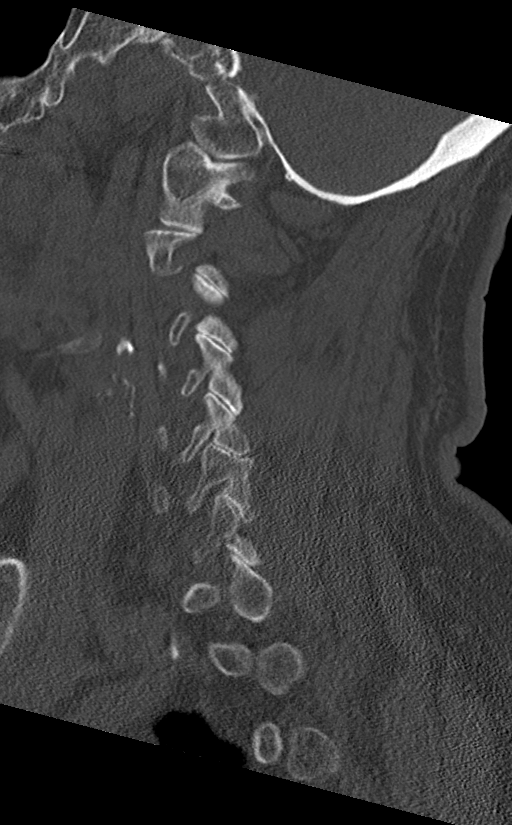

[Series 10: orthogonal bone · axial · 0.21mm/px · z∈[+22,+138]mm · 4 of 102 slices shown, 5 images]
[im 21/102  soft-tissue]
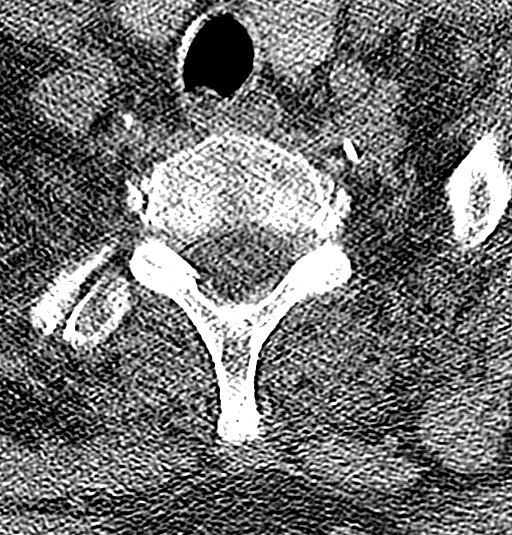
[im 21/102  bone]
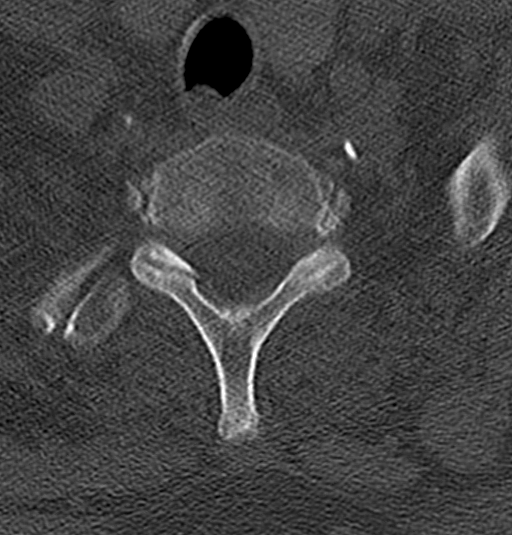
[im 41/102  bone]
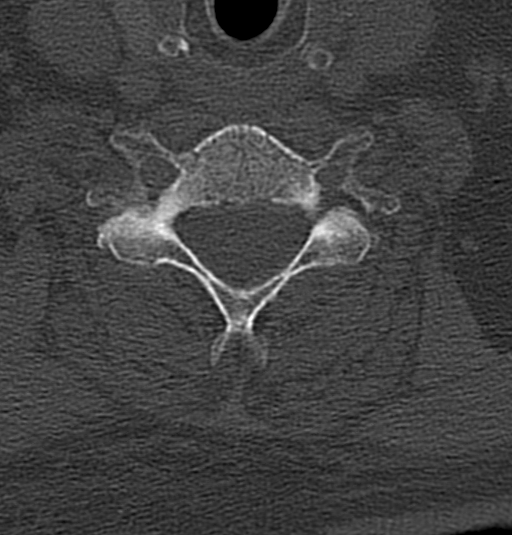
[im 61/102  bone]
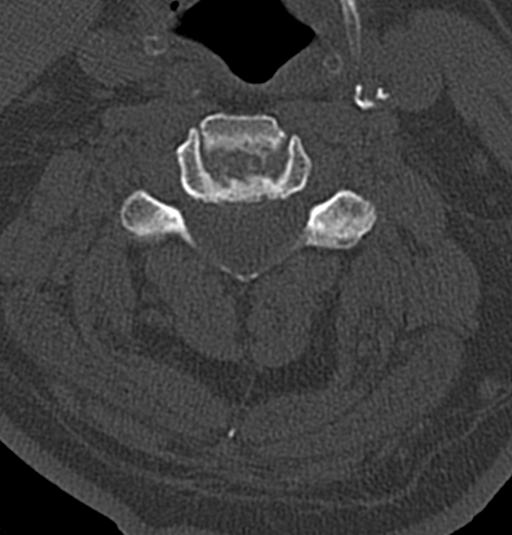
[im 81/102  bone]
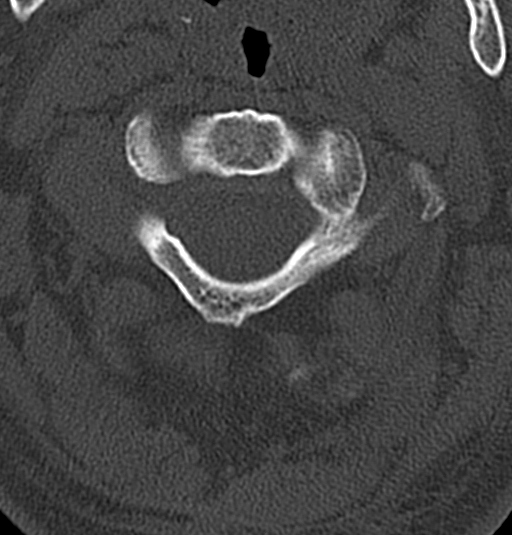

[14 of 33 positions shown; findings below may reference images not displayed]

FINDINGS: CT HEAD FINDINGS

Brain: Age related brain atrophy pattern without acute intracranial
hemorrhage, mass lesion, infarction, midline shift, herniation,
hydrocephalus, or extra-axial fluid collection. Normal gray-white
matter differentiation. No focal mass effect or edema. Cisterns are
patent. Cerebellar atrophy as well. Atherosclerosis of the
intracranial vessels noted at the skullbase.

Vascular: No hyperdense vessel or unexpected calcification.

Skull: Normal. Negative for fracture or focal lesion.

Sinuses/Orbits: Minor scattered sinus mucosal thickening. No sinus
air-fluid level or definite acute sinusitis. Mastoids remain clear.

Other: Orbits are symmetric.

CT CERVICAL SPINE FINDINGS

Alignment: Straightened cervical spine alignment but remains
anatomic. Facets are aligned. No subluxation dislocation.

Skull base and vertebrae: No skull base abnormality or malalignment.
Multilevel degenerative spondylosis at all levels with disc space
narrowing, sclerosis and osteophytes. Intact odontoid. Degenerative
changes at C1-2 articulation as well.

Soft tissues and spinal canal: Normal prevertebral soft tissues. No
soft tissue asymmetry in the neck. No prevertebral fluid or
swelling. No visualize canal hematoma. Carotid atherosclerosis
evident.

Upper chest: Clear lung apices. Atherosclerosis of the major branch
vessels. Thyroid unremarkable.

Other: None.
IMPRESSION: Brain atrophy without acute intracranial process by noncontrast
imaging. Stable exam.

Degenerative cervical spondylosis without acute osseous finding or
fracture by CT.

## 2018-05-24 ENCOUNTER — Ambulatory Visit: Payer: Medicare Other | Admitting: Psychiatry

## 2018-05-25 ENCOUNTER — Other Ambulatory Visit: Payer: Self-pay | Admitting: Family Medicine

## 2018-06-20 ENCOUNTER — Other Ambulatory Visit: Payer: Self-pay | Admitting: Nurse Practitioner

## 2018-06-20 DIAGNOSIS — N401 Enlarged prostate with lower urinary tract symptoms: Secondary | ICD-10-CM

## 2018-06-20 DIAGNOSIS — N138 Other obstructive and reflux uropathy: Secondary | ICD-10-CM

## 2018-06-20 DIAGNOSIS — I10 Essential (primary) hypertension: Secondary | ICD-10-CM

## 2018-06-21 ENCOUNTER — Ambulatory Visit: Payer: Medicare Other | Admitting: Psychiatry

## 2018-07-13 ENCOUNTER — Other Ambulatory Visit: Payer: Self-pay

## 2018-07-15 ENCOUNTER — Other Ambulatory Visit: Payer: Self-pay | Admitting: Family Medicine

## 2018-07-15 DIAGNOSIS — E782 Mixed hyperlipidemia: Secondary | ICD-10-CM

## 2018-07-16 ENCOUNTER — Encounter: Payer: Self-pay | Admitting: Family Medicine

## 2018-07-20 ENCOUNTER — Other Ambulatory Visit: Payer: Self-pay | Admitting: Family Medicine

## 2018-07-20 DIAGNOSIS — J3089 Other allergic rhinitis: Secondary | ICD-10-CM

## 2018-07-27 ENCOUNTER — Ambulatory Visit: Payer: Self-pay

## 2018-07-30 ENCOUNTER — Other Ambulatory Visit: Payer: Self-pay | Admitting: Psychiatry

## 2018-08-03 ENCOUNTER — Ambulatory Visit: Payer: Self-pay

## 2018-08-09 ENCOUNTER — Other Ambulatory Visit: Payer: Self-pay

## 2018-08-09 ENCOUNTER — Ambulatory Visit (INDEPENDENT_AMBULATORY_CARE_PROVIDER_SITE_OTHER): Payer: Medicare Other | Admitting: Psychiatry

## 2018-08-09 ENCOUNTER — Ambulatory Visit: Payer: Medicare Other | Admitting: Psychiatry

## 2018-08-09 DIAGNOSIS — F330 Major depressive disorder, recurrent, mild: Secondary | ICD-10-CM

## 2018-08-09 DIAGNOSIS — F411 Generalized anxiety disorder: Secondary | ICD-10-CM

## 2018-08-10 ENCOUNTER — Encounter: Payer: Self-pay | Admitting: Psychiatry

## 2018-08-10 NOTE — Progress Notes (Signed)
Patient ID: Erik Palmer, male   DOB: 04-16-1934, 84 y.o.   MRN: [id number]  Patient is 84 year old male with history of depression and anxiety who was followed up for medication management. His wife was also present in the phone. He reported that he enjoys his time at home by watching military channel and or movie channels. He stated that he's doing well and taking his medications. His wife fills his pillbox and he is compliant with medications. He appears pleasant and cooperative on the phone as usual. No other acute symptoms noted.  Plan Continue medications as prescribed. Follow up in three months earlier depending on his symptoms I discussed the assessment and treatment plan with the patient. The patient was provided an opportunity to ask questions and all were answered. The patient agreed with the plan and demonstrated an understanding of the instructions.   The patient was advised to call back or seek an in-person evaluation if the symptoms worsen or if the condition fails to improve as anticipated.   I provided 5 minutes of non-face-to-face time during this encounter.

## 2018-09-28 ENCOUNTER — Encounter: Payer: Self-pay | Admitting: Family Medicine

## 2018-11-16 ENCOUNTER — Ambulatory Visit (INDEPENDENT_AMBULATORY_CARE_PROVIDER_SITE_OTHER): Payer: Medicare Other

## 2018-11-16 ENCOUNTER — Other Ambulatory Visit: Payer: Self-pay

## 2018-11-16 VITALS — BP 124/62 | HR 70 | Ht 69.0 in | Wt 235.0 lb

## 2018-11-16 DIAGNOSIS — Z0000 Encounter for general adult medical examination without abnormal findings: Secondary | ICD-10-CM

## 2018-11-16 NOTE — Patient Instructions (Signed)
Erik Palmer , Thank you for taking time to come for your Medicare Wellness Visit. I appreciate your ongoing commitment to your health goals. Please review the following plan we discussed and let me know if I can assist you in the future.   Screening recommendations/referrals: Colonoscopy: no longer required Recommended yearly ophthalmology/optometry visit for glaucoma screening and checkup Recommended yearly dental visit for hygiene and checkup  Vaccinations: Influenza vaccine: due - declined Pneumococcal vaccine: pneumoax 23 due- declined Tdap vaccine: up to date Shingles vaccine: shingrix eligible, discussed getting at [facility] or pharmacy if you would like this vaccine     Advanced directives: please pick up a copy of this information next time you are in the office   Conditions/risks identified: discussed fall prevention   Next appointment: Follow up in one year for your annual wellness visit   Preventive Care 65 Years and Older, Male Preventive care refers to lifestyle choices and visits with your health care provider that can promote health and wellness. What does preventive care include?  A yearly physical exam. This is also called an annual well check.  Dental exams once or twice a year.  Routine eye exams. Ask your health care provider how often you should have your eyes checked.  Personal lifestyle choices, including:  Daily care of your teeth and gums.  Regular physical activity.  Eating a healthy diet.  Avoiding tobacco and drug use.  Limiting alcohol use.  Practicing safe sex.  Taking low doses of aspirin every day.  Taking vitamin and mineral supplements as recommended by your health care provider. What happens during an annual well check? The services and screenings done by your health care provider during your annual well check will depend on your age, overall health, lifestyle risk factors, and family history of disease. Counseling  Your health care  provider may ask you questions about your:  Alcohol use.  Tobacco use.  Drug use.  Emotional well-being.  Home and relationship well-being.  Sexual activity.  Eating habits.  History of falls.  Memory and ability to understand (cognition).  Work and work Statistician. Screening  You may have the following tests or measurements:  Height, weight, and BMI.  Blood pressure.  Lipid and cholesterol levels. These may be checked every 5 years, or more frequently if you are over 1 years old.  Skin check.  Lung cancer screening. You may have this screening every year starting at age 57 if you have a 30-pack-year history of smoking and currently smoke or have quit within the past 15 years.  Fecal occult blood test (FOBT) of the stool. You may have this test every year starting at age 51.  Flexible sigmoidoscopy or colonoscopy. You may have a sigmoidoscopy every 5 years or a colonoscopy every 10 years starting at age 58.  Prostate cancer screening. Recommendations will vary depending on your family history and other risks.  Hepatitis C blood test.  Hepatitis B blood test.  Sexually transmitted disease (STD) testing.  Diabetes screening. This is done by checking your blood sugar (glucose) after you have not eaten for a while (fasting). You may have this done every 1-3 years.  Abdominal aortic aneurysm (AAA) screening. You may need this if you are a current or former smoker.  Osteoporosis. You may be screened starting at age 71 if you are at high risk. Talk with your health care provider about your test results, treatment options, and if necessary, the need for more tests. Vaccines  Your health  care provider may recommend certain vaccines, such as:  Influenza vaccine. This is recommended every year.  Tetanus, diphtheria, and acellular pertussis (Tdap, Td) vaccine. You may need a Td booster every 10 years.  Zoster vaccine. You may need this after age 66.  Pneumococcal  13-valent conjugate (PCV13) vaccine. One dose is recommended after age 28.  Pneumococcal polysaccharide (PPSV23) vaccine. One dose is recommended after age 60. Talk to your health care provider about which screenings and vaccines you need and how often you need them. This information is not intended to replace advice given to you by your health care provider. Make sure you discuss any questions you have with your health care provider. Document Released: 03/16/2015 Document Revised: 11/07/2015 Document Reviewed: 12/19/2014 Elsevier Interactive Patient Education  2017 [place] [employer] Prevention in the Home Falls can cause injuries. They can happen to people of all ages. There are many things you can do to make your home safe and to help prevent falls. What can I do on the outside of my home?  Regularly fix the edges of walkways and driveways and fix any cracks.  Remove anything that might make you trip as you walk through a door, such as a raised step or threshold.  Trim any bushes or trees on the path to your home.  Use bright outdoor lighting.  Clear any walking paths of anything that might make someone trip, such as rocks or tools.  Regularly check to see if handrails are loose or broken. Make sure that both sides of any steps have handrails.  Any raised decks and porches should have guardrails on the edges.  Have any leaves, snow, or ice cleared regularly.  Use sand or salt on walking paths during winter.  Clean up any spills in your garage right away. This includes oil or grease spills. What can I do in the bathroom?  Use night lights.  Install grab bars by the toilet and in the tub and shower. Do not use towel bars as grab bars.  Use non-skid mats or decals in the tub or shower.  If you need to sit down in the shower, use a plastic, non-slip stool.  Keep the floor dry. Clean up any water that spills on the floor as soon as it happens.  Remove soap buildup in the  tub or shower regularly.  Attach bath mats securely with double-sided non-slip rug tape.  Do not have throw rugs and other things on the floor that can make you trip. What can I do in the bedroom?  Use night lights.  Make sure that you have a light by your bed that is easy to reach.  Do not use any sheets or blankets that are too big for your bed. They should not hang down onto the floor.  Have a firm chair that has side arms. You can use this for support while you get dressed.  Do not have throw rugs and other things on the floor that can make you trip. What can I do in the kitchen?  Clean up any spills right away.  Avoid walking on wet floors.  Keep items that you use a lot in easy-to-reach places.  If you need to reach something above you, use a strong step stool that has a grab bar.  Keep electrical cords out of the way.  Do not use floor polish or wax that makes floors slippery. If you must use wax, use non-skid floor wax.  Do not have  throw rugs and other things on the floor that can make you trip. What can I do with my stairs?  Do not leave any items on the stairs.  Make sure that there are handrails on both sides of the stairs and use them. Fix handrails that are broken or loose. Make sure that handrails are as long as the stairways.  Check any carpeting to make sure that it is firmly attached to the stairs. Fix any carpet that is loose or worn.  Avoid having throw rugs at the top or bottom of the stairs. If you do have throw rugs, attach them to the floor with carpet tape.  Make sure that you have a light switch at the top of the stairs and the bottom of the stairs. If you do not have them, ask someone to add them for you. What else can I do to help prevent falls?  Wear shoes that:  Do not have high heels.  Have rubber bottoms.  Are comfortable and fit you well.  Are closed at the toe. Do not wear sandals.  If you use a stepladder:  Make sure that it  is fully opened. Do not climb a closed stepladder.  Make sure that both sides of the stepladder are locked into place.  Ask someone to hold it for you, if possible.  Clearly mark and make sure that you can see:  Any grab bars or handrails.  First and last steps.  Where the edge of each step is.  Use tools that help you move around (mobility aids) if they are needed. These include:  Canes.  Walkers.  Scooters.  Crutches.  Turn on the lights when you go into a dark area. Replace any light bulbs as soon as they burn out.  Set up your furniture so you have a clear path. Avoid moving your furniture around.  If any of your floors are uneven, fix them.  If there are any pets around you, be aware of where they are.  Review your medicines with your doctor. Some medicines can make you feel dizzy. This can increase your chance of falling. Ask your doctor what other things that you can do to help prevent falls. This information is not intended to replace advice given to you by your health care provider. Make sure you discuss any questions you have with your health care provider. Document Released: 12/14/2008 Document Revised: 07/26/2015 Document Reviewed: 03/24/2014 Elsevier Interactive Patient Education  2017 [employer].

## 2018-11-16 NOTE — Progress Notes (Signed)
Subjective:   Erik ElliotWalter D Palmer is a 84 y.o. male who presents for Medicare Annual/Subsequent preventive examination.  This visit is being conducted via phone call  - after an attmept to do on video chat - due to the COVID-19 pandemic. This patient has given me verbal consent via phone to conduct this visit, patient states they are participating from their home address. Some vital signs may be absent or patient reported.   Patient identification: identified by name, DOB, and current address.    Review of Systems:   Cardiac Risk Factors include: advanced age (>2955men, 69>65 women);male gender;dyslipidemia;hypertension;obesity (BMI >30kg/m2)     Objective:    Vitals: BP 124/62 Comment: pt reported  Pulse 70 Comment: pt reported  Ht 5\' 9"  (1.753 m)   Wt 235 lb (106.6 kg) Comment: pt reported  BMI 34.70 kg/m   Body mass index is 34.7 kg/m.  Advanced Directives 07/21/2017 03/26/2017 12/27/2015 12/27/2015 12/27/2015 12/05/2015 12/04/2015  Does Patient Have a Medical Advance Directive? No No No No No No No  Would patient like information on creating a medical advance directive? No - Patient declined - No - patient declined information No - patient declined information No - patient declined information - Yes - Educational materials given  Some encounter information is confidential and restricted. Go to Review Flowsheets activity to see all data.    Tobacco Social History   Tobacco Use  Smoking Status Never Smoker  Smokeless Tobacco Never Used     Counseling given: Not Answered   Clinical Intake:  Pre-visit preparation completed: Yes  Pain : No/denies pain     Nutritional Status: BMI > 30  Obese Nutritional Risks: None Diabetes: No  How often do you need to have someone help you when you read instructions, pamphlets, or other written materials from your doctor or pharmacy?: 1 - Never  Interpreter Needed?: No  Information entered by :: Kin Galbraith,LPN  Past Medical History:   Diagnosis Date  . Angina pectoris syndrome (HCC)   . 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 Arthritis   . GERD (gastroesophageal reflux disease)   . Grief at loss of child 09/03/2014  . Hypercholesteremia   . Hypertension   . Prostate enlargement    Past Surgical History:  Procedure Laterality Date  . CORONARY [facility]  . TOTAL HIP ARTHROPLASTY Right 12/05/2015   Procedure: TOTAL HIP ARTHROPLASTY ANTERIOR APPROACH;  Surgeon: Kennedy BuckerMichael Menz, MD;  Location: [facility] ORS;  Service: Orthopedics;  Laterality: Right;   Family History  Problem Relation Age of Onset  . Hypertension Mother   . Heart block Mother   . Stroke Mother   . Depression Mother   . Cancer Father   . Heart attack Sister   . COPD Sister   . Hypertension Brother   . Hypertension Brother   . Hypertension Brother   . Hypertension Brother   . Heart attack Brother   . Diabetes Neg Hx    Social History   Socioeconomic History  . Marital status: Married    Spouse name: Not on file  . Number of children: Not on file  . Years of education: Not on file  . Highest education level: Not on file  Occupational History  . Occupation: retired  Engineer, productionocial Needs  . Financial resource strain: Not hard at all  . Food insecurity    Worry: Never true    Inability: Never true  . Transportation  needs    Medical: No    Non-medical: No  Tobacco Use  . Smoking status: Never Smoker  . Smokeless tobacco: Never Used  Substance and Sexual Activity  . Alcohol use: No    Alcohol/week: 0.0 standard drinks  . Drug use: No  . Sexual activity: Not Currently  Lifestyle  . Physical activity    Days per week: 0 days    Minutes per session: 0 min  . Stress: Not at all  Relationships  . Social Musician on phone: Never    Gets together: More than three times a week    Attends religious service: Never    Active member of club or organization: No    Attends meetings of clubs  or organizations: Never    Relationship status: Married  Other Topics Concern  . Not on file  Social History Narrative  . Not on file    Outpatient Encounter Medications as of 11/16/2018  Medication Sig  . acetaminophen (TYLENOL) 650 MG CR tablet Take 650 mg by mouth daily. Reported on 05/08/2015  . amLODipine (NORVASC) 10 MG tablet Take 5 mg by mouth daily.   [name] apixaban (ELIQUIS) 5 MG TABS tablet Take 5 mg by mouth 2 (two) times daily.  [name] aspirin EC 81 MG tablet Take 81 mg by mouth daily.  [name] atorvastatin (LIPITOR) 40 MG tablet TAKE 1 TABLET BY MOUTH EVERYDAY AT BEDTIME  . carvedilol (COREG) 6.25 MG tablet Take 6.25 mg by mouth 2 (two) times daily with a meal.  . cholecalciferol (VITAMIN D) 400 units TABS tablet Take 400 Units by mouth daily.  [name] doxazosin (CARDURA) 2 MG tablet TAKE 1 TABLET (2 MG TOTAL) BY MOUTH 2 (TWO) TIMES DAILY.  . fluticasone (FLONASE) 50 MCG/ACT nasal spray INHALE 2 SPRAYS INTO EACH NOSTRIL EVERY DAY  . mirabegron ER (MYRBETRIQ) 25 MG TB24 tablet Take 1 tablet (25 mg total) by mouth daily.  [name] omeprazole (PRILOSEC) 20 MG capsule TAKE 1 CAPSULE BY MOUTH TWICE A DAY  . polyethylene glycol powder (GLYCOLAX/MIRALAX) powder Take 17 g by mouth daily.  . QUEtiapine (SEROQUEL) 50 MG tablet TAKE 1 TABLET BY MOUTH EVERYDAY AT BEDTIME   No facility-administered encounter medications on file as of 11/16/2018.     Activities of Daily Living In your present state of health, do you have any difficulty performing the following activities: 11/16/2018  Hearing? Y  Comment hearing aids, doesnt use them  Vision? Y  Comment eyes get tired during the day, goes to [facility] annually  Difficulty concentrating or making decisions? N  Walking or climbing stairs? N  Dressing or bathing? N  Doing errands, shopping? N  Preparing Food and eating ? N  Using the Toilet? N  In the past six months, have you accidently leaked urine? N  Do you have problems with loss of bowel control?  N  Managing your Medications? N  Managing your Finances? N  Housekeeping or managing your Housekeeping? N  Some recent data might be hidden    Patient Care Team: Smitty Cords, DO as PCP - General (Family Medicine)   Assessment:   This is a routine wellness examination for Adeel.  Exercise Activities and Dietary recommendations Current Exercise Habits: The patient does not participate in regular exercise at present, Exercise limited by: None identified  Goals    . DIET - INCREASE WATER INTAKE     Recommend drinking at least 6-8 glasses of water a day     .  Remain Stable     Keep active and control anxiety.        Fall Risk: Fall Risk  11/16/2018 07/21/2017 07/21/2017 11/25/2016 07/22/2016  Falls in the past year? 0 No No No No  Number falls in past yr: - - - - -  Injury with Fall? - - - - -  Follow up - - - - -    FALL RISK PREVENTION PERTAINING TO THE HOME:  Any stairs in or around the home? Yes  If so, are there any without handrails? No   Home free of loose throw rugs in walkways, pet beds, electrical cords, etc? yes Adequate lighting in your home to reduce risk of falls? Yes   ASSISTIVE DEVICES UTILIZED TO PREVENT FALLS:  Life alert? No  Use of a cane, walker or w/c? No  Grab bars in the bathroom? Yes  Shower chair or bench in shower? Yes  Elevated toilet seat or a handicapped toilet? Yes   TIMED UP AND GO:  Unable to perform   Depression Screen PHQ 2/9 Scores 11/16/2018 07/21/2017 07/21/2017 11/25/2016  PHQ - 2 Score 0 0 0 0  PHQ- 9 Score - 0 - 0    Cognitive Function MMSE - Mini Mental State Exam 12/22/2014  Orientation to time 5  Orientation to Place 5  Registration 3  Attention/ Calculation 5  Recall 3  Language- name 2 objects 2  Language- repeat 1  Language- follow 3 step command 3  Language- read & follow direction 1  Write a sentence 1  Copy design 1  Total score 30     6CIT Screen 07/21/2017  What Year? 0 points  What month?  0 points  What time? 0 points  Count back from 20 0 points  Months in reverse 0 points  Repeat phrase 0 points  Total Score 0    Immunization History  Administered Date(s) Administered  . Pneumococcal Conjugate-13 06/20/2014  . Tdap 06/20/2014    Qualifies for Shingles Vaccine? Yes  Zostavax completed n/a.  Due for Shingrix. Education has been provided regarding the importance of this vaccine. Pt has been advised to call insurance company to determine out of pocket expense. Advised may also receive vaccine at local pharmacy or Health Dept. Verbalized acceptance and understanding.  Tdap: up to date   Flu Vaccine: Due for Flu vaccine.   Pneumococcal Vaccine: Due for Pneumococcal vaccine.   Screening Tests Health Maintenance  Topic Date Due  . INFLUENZA VACCINE  06/01/2019 (Originally 10/02/2018)  . PNA vac Low Risk Adult (2 of 2 - PPSV23) 11/16/2019 (Originally 06/20/2015)  . TETANUS/TDAP  06/19/2024   Cancer Screenings:  Colorectal Screening: no longer required  Lung Cancer Screening: (Low Dose CT Chest recommended if Age 64-80 years, 30 pack-year currently smoking OR have quit w/in 15years.) does not qualify.    Additional Screening:  Hepatitis C Screening: does not qualify  Vision Screening: Recommended annual ophthalmology exams for early detection of glaucoma and other disorders of the eye. Is the patient up to date with their annual eye exam?  Yes  Who is the provider or what is the name of the office in which the pt attends annual eye exams? [facility]   Dental Screening: Recommended annual dental exams for proper oral hygiene  Community Resource Referral:  CRR required this visit?  No       Plan:  I have personally reviewed and addressed the Medicare Annual Wellness questionnaire and have noted the following  in the patient's chart:  A. Medical and social history B. Use of alcohol, tobacco or illicit drugs  C. Current medications and supplements D.  Functional ability and status E.  Nutritional status F.  Physical activity G. Advance directives H. List of other physicians I.  Hospitalizations, surgeries, and ER visits in previous 12 months J.  [place] such as hearing and vision if needed, cognitive and depression L. Referrals and appointments   In addition, I have reviewed and discussed with patient certain preventive protocols, quality metrics, and best practice recommendations. A written personalized care plan for preventive services as well as general preventive health recommendations were provided to patient.   Signed,   Bevelyn Ngo, LPN  3/89/3734 Nurse Health Advisor   Nurse Notes: none

## 2018-11-22 ENCOUNTER — Encounter: Payer: Self-pay | Admitting: Psychiatry

## 2018-11-22 ENCOUNTER — Ambulatory Visit (INDEPENDENT_AMBULATORY_CARE_PROVIDER_SITE_OTHER): Payer: Medicare Other | Admitting: Psychiatry

## 2018-11-22 ENCOUNTER — Other Ambulatory Visit: Payer: Self-pay

## 2018-11-22 DIAGNOSIS — F330 Major depressive disorder, recurrent, mild: Secondary | ICD-10-CM | POA: Diagnosis not present

## 2018-11-22 DIAGNOSIS — F411 Generalized anxiety disorder: Secondary | ICD-10-CM

## 2018-11-22 MED ORDER — QUETIAPINE FUMARATE 50 MG PO TABS: 50.0000 mg | ORAL_TABLET | Freq: Every day | ORAL | 1 refills | Status: DC

## 2018-11-22 NOTE — Progress Notes (Signed)
Patient ID: Erik Palmer, male   DOB: Aug 20, 1934, 84 y.o.   MRN: [id number]   Patient is a 84 year old male followed a for medication management. He was pleasant as usual and reported that he was planting some flowers outside today for the fall. He reported that he spends most of the time outside in the garden and is enjoying the fall weather. He stated that he has been compliant with his medications and does not have any other acute symptoms. He lives with his wife and has not notice any change in his memory. He denied having any problems with her sleep. He denied having any suicidal or homicidal ideations or plans. He remains pleasant during the interview. No other acute symptoms noted at this time.  Plan I will refilled his medications. He will follow-up in three months earlier depending on his symptoms.   I connected with patient via telemedicine application and verified that I am speaking with the correct person using two identifiers.  I discussed the limitations of evaluation and management by telemedicine and the availability of in person appointments. The patient expressed understanding and agreed to proceed.   I discussed the assessment and treatment plan with the patient. The patient was provided an opportunity to ask questions and all were answered. The patient agreed with the plan and demonstrated an understanding of the instructions.   The patient was advised to call back or seek an in-person evaluation if the symptoms worsen or if the condition fails to improve as anticipated.   I provided 15 minutes of non-face-to-face time during this encounter.

## 2018-11-23 ENCOUNTER — Encounter: Payer: Self-pay | Admitting: Family Medicine

## 2018-11-26 ENCOUNTER — Other Ambulatory Visit: Payer: Self-pay | Admitting: Family Medicine

## 2018-12-28 ENCOUNTER — Other Ambulatory Visit: Payer: Self-pay | Admitting: Family Medicine

## 2018-12-28 DIAGNOSIS — I10 Essential (primary) hypertension: Secondary | ICD-10-CM

## 2018-12-28 DIAGNOSIS — N401 Enlarged prostate with lower urinary tract symptoms: Secondary | ICD-10-CM

## 2018-12-28 DIAGNOSIS — N138 Other obstructive and reflux uropathy: Secondary | ICD-10-CM

## 2019-01-07 ENCOUNTER — Telehealth: Payer: Self-pay

## 2019-01-07 DIAGNOSIS — R7309 Other abnormal glucose: Secondary | ICD-10-CM

## 2019-01-07 DIAGNOSIS — Z0000 Encounter for general adult medical examination without abnormal findings: Secondary | ICD-10-CM

## 2019-01-07 DIAGNOSIS — I4821 Permanent atrial fibrillation: Secondary | ICD-10-CM

## 2019-01-07 DIAGNOSIS — N401 Enlarged prostate with lower urinary tract symptoms: Secondary | ICD-10-CM

## 2019-01-07 DIAGNOSIS — Z125 Encounter for screening for malignant neoplasm of prostate: Secondary | ICD-10-CM

## 2019-01-07 DIAGNOSIS — I10 Essential (primary) hypertension: Secondary | ICD-10-CM

## 2019-01-07 DIAGNOSIS — E782 Mixed hyperlipidemia: Secondary | ICD-10-CM

## 2019-01-07 DIAGNOSIS — N138 Other obstructive and reflux uropathy: Secondary | ICD-10-CM

## 2019-01-07 NOTE — Telephone Encounter (Signed)
Signed orders  Nobie Putnam, [facility] 01/07/2019, 12:34 PM

## 2019-01-10 ENCOUNTER — Other Ambulatory Visit: Payer: Self-pay

## 2019-01-10 ENCOUNTER — Other Ambulatory Visit: Payer: Medicare Other

## 2019-01-10 DIAGNOSIS — I10 Essential (primary) hypertension: Secondary | ICD-10-CM | POA: Diagnosis not present

## 2019-01-10 DIAGNOSIS — R7309 Other abnormal glucose: Secondary | ICD-10-CM | POA: Diagnosis not present

## 2019-01-10 DIAGNOSIS — N138 Other obstructive and reflux uropathy: Secondary | ICD-10-CM | POA: Diagnosis not present

## 2019-01-10 DIAGNOSIS — I4821 Permanent atrial fibrillation: Secondary | ICD-10-CM

## 2019-01-10 DIAGNOSIS — E782 Mixed hyperlipidemia: Secondary | ICD-10-CM

## 2019-01-10 DIAGNOSIS — Z0000 Encounter for general adult medical examination without abnormal findings: Secondary | ICD-10-CM

## 2019-01-10 DIAGNOSIS — Z125 Encounter for screening for malignant neoplasm of prostate: Secondary | ICD-10-CM

## 2019-01-11 LAB — CBC WITH DIFFERENTIAL/PLATELET
Absolute Monocytes: 551 cells/uL (ref 200–950)
Basophils Absolute: 27 cells/uL (ref 0–200)
Basophils Relative: 0.4 %
Eosinophils Absolute: 88 cells/uL (ref 15–500)
Eosinophils Relative: 1.3 %
HCT: 36.2 % — ABNORMAL LOW (ref 38.5–50.0)
Hemoglobin: 12 g/dL — ABNORMAL LOW (ref 13.2–17.1)
Lymphs Abs: 1632 cells/uL (ref 850–3900)
MCH: 30.4 pg (ref 27.0–33.0)
MCHC: 33.1 g/dL (ref 32.0–36.0)
MCV: 91.6 fL (ref 80.0–100.0)
MPV: 10.4 fL (ref 7.5–12.5)
Monocytes Relative: 8.1 %
Neutro Abs: 4502 cells/uL (ref 1500–7800)
Neutrophils Relative %: 66.2 %
Platelets: 321 10*3/uL (ref 140–400)
RBC: 3.95 10*6/uL — ABNORMAL LOW (ref 4.20–5.80)
RDW: 13.2 % (ref 11.0–15.0)
Total Lymphocyte: 24 %
WBC: 6.8 10*3/uL (ref 3.8–10.8)

## 2019-01-11 LAB — HEMOGLOBIN A1C
Hgb A1c MFr Bld: 5.7 % of total Hgb — ABNORMAL HIGH (ref <5.7)
Mean Plasma Glucose: 117 (calc)
eAG (mmol/L): 6.5 (calc)

## 2019-01-11 LAB — COMPLETE METABOLIC PANEL WITH GFR
AG Ratio: 2.2 (calc) (ref 1.0–2.5)
ALT: 11 U/L (ref 9–46)
AST: 12 U/L (ref 10–35)
Albumin: 4.2 g/dL (ref 3.6–5.1)
Alkaline phosphatase (APISO): 68 U/L (ref 35–144)
BUN: 17 mg/dL (ref 7–25)
CO2: 28 mmol/L (ref 20–32)
Calcium: 9.5 mg/dL (ref 8.6–10.3)
Chloride: 104 mmol/L (ref 98–110)
Creat: 0.77 mg/dL (ref 0.70–1.11)
GFR, Est African American: 98 mL/min/{1.73_m2} (ref >=60)
GFR, Est Non African American: 85 mL/min/{1.73_m2} (ref >=60)
Globulin: 1.9 g/dL (calc) (ref 1.9–3.7)
Glucose, Bld: 95 mg/dL (ref 65–99)
Potassium: 4.2 mmol/L (ref 3.5–5.3)
Sodium: 142 mmol/L (ref 135–146)
Total Bilirubin: 0.5 mg/dL (ref 0.2–1.2)
Total Protein: 6.1 g/dL (ref 6.1–8.1)

## 2019-01-11 LAB — PSA: PSA: 3.6 ng/mL (ref <=4.0)

## 2019-01-11 LAB — LIPID PANEL
Cholesterol: 157 mg/dL (ref <200)
HDL: 44 mg/dL (ref >=40)
LDL Cholesterol (Calc): 90 mg/dL (calc)
Non-HDL Cholesterol (Calc): 113 mg/dL (calc) (ref <130)
Total CHOL/HDL Ratio: 3.6 (calc) (ref <5.0)
Triglycerides: 129 mg/dL (ref <150)

## 2019-01-16 ENCOUNTER — Other Ambulatory Visit: Payer: Self-pay | Admitting: Family Medicine

## 2019-01-16 DIAGNOSIS — J3089 Other allergic rhinitis: Secondary | ICD-10-CM

## 2019-01-17 ENCOUNTER — Other Ambulatory Visit: Payer: Self-pay

## 2019-01-17 ENCOUNTER — Ambulatory Visit (INDEPENDENT_AMBULATORY_CARE_PROVIDER_SITE_OTHER): Payer: Medicare Other | Admitting: Family Medicine

## 2019-01-17 ENCOUNTER — Encounter: Payer: Self-pay | Admitting: Family Medicine

## 2019-01-17 ENCOUNTER — Other Ambulatory Visit: Payer: Self-pay | Admitting: Family Medicine

## 2019-01-17 VITALS — BP 136/74 | HR 78 | Temp 98.5°F | Resp 16 | Ht 69.0 in | Wt 242.0 lb

## 2019-01-17 DIAGNOSIS — N138 Other obstructive and reflux uropathy: Secondary | ICD-10-CM

## 2019-01-17 DIAGNOSIS — I10 Essential (primary) hypertension: Secondary | ICD-10-CM

## 2019-01-17 DIAGNOSIS — E669 Obesity, unspecified: Secondary | ICD-10-CM

## 2019-01-17 DIAGNOSIS — N3281 Overactive bladder: Secondary | ICD-10-CM

## 2019-01-17 DIAGNOSIS — R7309 Other abnormal glucose: Secondary | ICD-10-CM | POA: Diagnosis not present

## 2019-01-17 DIAGNOSIS — R972 Elevated prostate specific antigen [PSA]: Secondary | ICD-10-CM

## 2019-01-17 DIAGNOSIS — N401 Enlarged prostate with lower urinary tract symptoms: Secondary | ICD-10-CM

## 2019-01-17 DIAGNOSIS — Z0000 Encounter for general adult medical examination without abnormal findings: Secondary | ICD-10-CM

## 2019-01-17 DIAGNOSIS — E782 Mixed hyperlipidemia: Secondary | ICD-10-CM

## 2019-01-17 DIAGNOSIS — F5104 Psychophysiologic insomnia: Secondary | ICD-10-CM | POA: Diagnosis not present

## 2019-01-17 DIAGNOSIS — Z125 Encounter for screening for malignant neoplasm of prostate: Secondary | ICD-10-CM

## 2019-01-17 DIAGNOSIS — I4821 Permanent atrial fibrillation: Secondary | ICD-10-CM

## 2019-01-17 MED ORDER — QUETIAPINE FUMARATE 50 MG PO TABS: 50.0000 mg | ORAL_TABLET | Freq: Every day | ORAL | 1 refills | Status: DC

## 2019-01-17 MED ORDER — OXYBUTYNIN CHLORIDE ER 5 MG PO TB24: 5.0000 mg | ORAL_TABLET | Freq: Every day | ORAL | 2 refills | Status: DC

## 2019-01-17 NOTE — Progress Notes (Signed)
Subjective:    Patient ID: Erik Palmer, male    DOB: [birth year], 84 y.o.   MRN: [id number]  Erik Palmer is a 84 y.o. male presenting on 01/17/2019 for Annual Exam   HPI   Here for Annual Physical and Lab Review.   Elevated A1c A1c average 5.6 to 5.7, last lab 5.7 stable without new concerns. CBGs:not checking CBG regularly Meds:never on med Currentlynot onACEi / ARB Lifestyle: - Diet (improved diet)  - Exercise (limited but he is improving exercise) Denies hypoglycemia  Cardiology / History of Atrial Fibrillation / HTN Previously dx with chronic AFibt, cardiology has recommended to continue eliquis for anticoag Current Meds - Amlodipine 10mg  daily, Carvedilol 6.25mg  BID, Doxazosin 2mg  BID   Reports good compliance, took meds today. Tolerating well, w/o complaints.  HYPERLIPIDEMIA: - Reports no concerns. Last lipid panel 01/2019, mostly controlled  - Currently taking Atorvastatin 40mg , tolerating well without side effects or myalgias  BPH with LUTS [facility] and [facility] and DRE showed normal exam by report, he was treated for UTI. Improved on Doxazosin currently but still complaints of significant LUTS. - He is asking for bladder spasm medication, already on alpha blocker. And he says was told prostate is not too large. - Urology at [facility] did not offer other treatment according to him - We previously rx Myrbetriq and it was too costly for patient he never started it.  Insomnia Followed by [facility] has managed well on Seroquel 50mg  nightly for insomnia, his psych has left and they scheduled him with new provider in march 2021, he asks to switch his rx to our office for now, he said that he has not required any other intervention.  Health Maintenance:  Prostate CA Screening: PSA trend 2.3 > to 2.7 (07/2017 - at [facility]) now up to 3.6 (01/2019) Fam history [facility] with possible [facility]  Due for Flu Shot,  declines today despite counseling on benefits   Depression screen [employer] 2/9 01/17/2019 11/16/2018 07/21/2017  Decreased Interest 0 0 0  Down, Depressed, Hopeless 0 0 0  PHQ - 2 Score 0 0 0  Altered sleeping 2 - 0  Tired, decreased energy 0 - 0  Change in appetite 0 - 0  Feeling bad or failure about yourself  0 - 0  Trouble concentrating 0 - 0  Moving slowly or fidgety/restless 0 - 0  Suicidal thoughts 0 - 0  PHQ-9 Score 2 - 0  Difficult doing work/chores Somewhat difficult - Not difficult at all    Past Medical History:  Diagnosis Date  . Angina pectoris syndrome (HCC)   . 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 Arthritis   . GERD (gastroesophageal reflux disease)   . Grief at loss of child 09/03/2014  . Hypercholesteremia   . Hypertension   . Prostate enlargement    Past Surgical History:  Procedure Laterality Date  . CORONARY ARTERY BYPASS GRAFT  03/25/2017   at [facility]  . TOTAL HIP ARTHROPLASTY Right 12/05/2015   Procedure: TOTAL HIP ARTHROPLASTY ANTERIOR APPROACH;  Surgeon: 11/04/2014, MD;  Location: [facility] ORS;  Service: Orthopedics;  Laterality: Right;   Social History   Socioeconomic History  . Marital status: Married    Spouse name: Not on file  . Number of children: Not on file  . Years of education: Not on file  . Highest education level: Not on file  Occupational History  . Occupation: retired  Social Needs  . Financial resource strain: Not hard at all  . Food insecurity    Worry: Never true    Inability: Never true  . Transportation needs    Medical: No    Non-medical: No  Tobacco Use  . Smoking status: Never Smoker  . Smokeless tobacco: Never Used  Substance and Sexual Activity  . Alcohol use: No    Alcohol/week: 0.0 standard drinks  . Drug use: No  . Sexual activity: Not Currently  Lifestyle  . Physical activity    Days per week: 0 days    Minutes per session: 0 min  . Stress: Not at all  Relationships  . Social Wellsite geologist on phone: Never    Gets together: More than three times a week    Attends religious service: Never    Active member of club or organization: No    Attends meetings of clubs or organizations: Never    Relationship status: Married  . Intimate partner violence    Fear of current or ex partner: No    Emotionally abused: No    Physically abused: No    Forced sexual activity: No  Other Topics Concern  . Not on file  Social History Narrative  . Not on file   Family History  Problem Relation Age of Onset  . Hypertension Mother   . Heart block Mother   . Stroke Mother   . Depression Mother   . Cancer Father   . Heart attack Sister   . COPD Sister   . Hypertension Brother   . Hypertension Brother   . Hypertension Brother   . Hypertension Brother   . Heart attack Brother   . Diabetes Neg Hx    Current Outpatient Medications on File Prior to Visit  Medication Sig  . acetaminophen (TYLENOL) 650 MG CR tablet Take 650 mg by mouth daily. Reported on 05/08/2015  . amLODipine (NORVASC) 10 MG tablet Take 5 mg by mouth daily.   [name] apixaban (ELIQUIS) 5 MG TABS tablet Take 5 mg by mouth 2 (two) times daily.  [name] aspirin EC 81 MG tablet Take 81 mg by mouth daily.  [name] atorvastatin (LIPITOR) 40 MG tablet TAKE 1 TABLET BY MOUTH EVERYDAY AT BEDTIME  . carvedilol (COREG) 6.25 MG tablet Take 6.25 mg by mouth 2 (two) times daily with a meal.  . cholecalciferol (VITAMIN D) 400 units TABS tablet Take 400 Units by mouth daily.  [name] doxazosin (CARDURA) 2 MG tablet TAKE 1 TABLET (2 MG TOTAL) BY MOUTH 2 (TWO) TIMES DAILY.  . fluticasone (FLONASE) 50 MCG/ACT nasal spray INSTILL 2 SPRAYS INTO EACH NOSTRIL EVERY DAY  . omeprazole (PRILOSEC) 20 MG capsule TAKE 1 CAPSULE BY MOUTH TWICE A DAY  . polyethylene glycol powder (GLYCOLAX/MIRALAX) powder Take 17 g by mouth daily.   No current facility-administered medications on file prior to visit.     Review of Systems  Constitutional: Negative for activity  change, appetite change, chills, diaphoresis, fatigue and fever.  HENT: Negative for congestion and hearing loss.   Eyes: Negative for visual disturbance.  Respiratory: Negative for apnea, cough, chest tightness, shortness of breath and wheezing.   Cardiovascular: Negative for chest pain, palpitations and leg swelling.  Gastrointestinal: Negative for abdominal pain, anal bleeding, blood in stool, constipation, diarrhea, nausea and vomiting.  Endocrine: Negative for cold intolerance.  Genitourinary: Positive for difficulty urinating and frequency. Negative for decreased urine volume, dysuria, hematuria, testicular pain and urgency.  Musculoskeletal: Negative for arthralgias, back pain and neck pain.  Skin: Negative for rash.  Allergic/Immunologic: Negative for environmental allergies.  Neurological: Negative for dizziness, weakness, light-headedness, numbness and headaches.  Hematological: Negative for adenopathy.  Psychiatric/Behavioral: Negative for behavioral problems, dysphoric mood and sleep disturbance. The patient is not nervous/anxious.    Per HPI unless specifically indicated above       Objective:    BP 136/74 (BP Location: Left Arm, Cuff Size: Normal)   Pulse 78   Temp 98.5 F (36.9 C) (Oral)   Resp 16   Ht  (1.753 m)   Wt 242 lb (109.8 kg)   BMI 35.74 kg/m   Wt Readings from Last 3 Encounters:  01/17/19 242 lb (109.8 kg)  11/16/18 235 lb (106.6 kg)  01/21/18 245 lb 9.6 oz (111.4 kg)    Physical Exam Vitals signs and nursing note reviewed.  Constitutional:      General: He is not in acute distress.    Appearance: He is well-developed. He is not diaphoretic.     Comments: Well-appearing, comfortable, cooperative  HENT:     Head: Normocephalic and atraumatic.  Eyes:     General:        Right eye: No discharge.        Left eye: No discharge.     Conjunctiva/sclera: Conjunctivae normal.     Pupils: Pupils are equal, round, and reactive to light.  Neck:      Musculoskeletal: Normal range of motion and neck supple.     Thyroid: No thyromegaly.  Cardiovascular:     Rate and Rhythm: Normal rate. Rhythm irregular.     Heart sounds: Normal heart sounds. No murmur.  Pulmonary:     Effort: Pulmonary effort is normal. No respiratory distress.     Breath sounds: Normal breath sounds. No wheezing or rales.  Abdominal:     General: Bowel sounds are normal. There is no distension.     Palpations: Abdomen is soft. There is no mass.     Tenderness: There is no abdominal tenderness.  Musculoskeletal: Normal range of motion.        General: No tenderness.     Comments: Upper / Lower Extremities: - Normal muscle tone, strength bilateral upper extremities 5/5, lower extremities 5/5  Lymphadenopathy:     Cervical: No cervical adenopathy.  Skin:    General: Skin is warm and dry.     Findings: No erythema or rash.  Neurological:     Mental Status: He is alert and oriented to person, place, and time.     Comments: Distal sensation intact to light touch all extremities  Psychiatric:        Behavior: Behavior normal.     Comments: Well groomed, good eye contact, normal speech and thoughts       Results for orders placed or performed in visit on 01/10/19  physical PSA  Result Value Ref Range   PSA 3.6 < OR = 4.0 ng/mL  physical Lipid panel  Result Value Ref Range   Cholesterol 157 <200 mg/dL   HDL 44 > OR = 40 mg/dL   Triglycerides 161 <096 mg/dL   LDL Cholesterol (Calc) 90 mg/dL (calc)   Total CHOL/HDL Ratio 3.6 <5.0 (calc)   Non-HDL Cholesterol (Calc) 113 <130 mg/dL (calc)  physical HgB E4V  Result Value Ref Range   Hgb A1c MFr Bld 5.7 (H) <5.7 % of total Hgb   Mean Plasma Glucose 117 (calc)   eAG (mmol/L)  6.5 (calc)  physical CBC  Result Value Ref Range   WBC 6.8 3.8 - 10.8 Thousand/uL   RBC 3.95 (L) 4.20 - 5.80 Million/uL   Hemoglobin 12.0 (L) 13.2 - 17.1 g/dL   HCT 36.2 (L) 38.5 - 50.0 %   MCV 91.6 80.0 - 100.0 fL   MCH 30.4 27.0 -  33.0 pg   MCHC 33.1 32.0 - 36.0 g/dL   RDW 13.2 11.0 - 15.0 %   Platelets 321 140 - 400 Thousand/uL   MPV 10.4 7.5 - 12.5 fL   Neutro Abs 4,502 1,500 - 7,800 cells/uL   Lymphs Abs 1,632 850 - 3,900 cells/uL   Absolute Monocytes 551 200 - 950 cells/uL   Eosinophils Absolute 88 15 - 500 cells/uL   Basophils Absolute 27 0 - 200 cells/uL   Neutrophils Relative % 66.2 %   Total Lymphocyte 24.0 %   Monocytes Relative 8.1 %   Eosinophils Relative 1.3 %   Basophils Relative 0.4 %  cmet with GFR physical  Result Value Ref Range   Glucose, Bld 95 65 - 99 mg/dL   BUN 17 7 - 25 mg/dL   Creat 0.77 0.70 - 1.11 mg/dL   GFR, Est Non African American 85 > OR = 60 mL/min/1.28m2   GFR, Est African American 98 > OR = 60 mL/min/1.74m2   BUN/Creatinine Ratio NOT APPLICABLE 6 - 22 (calc)   Sodium 142 135 - 146 mmol/L   Potassium 4.2 3.5 - 5.3 mmol/L   Chloride 104 98 - 110 mmol/L   CO2 28 20 - 32 mmol/L   Calcium 9.5 8.6 - 10.3 mg/dL   Total Protein 6.1 6.1 - 8.1 g/dL   Albumin 4.2 3.6 - 5.1 g/dL   Globulin 1.9 1.9 - 3.7 g/dL (calc)   AG Ratio 2.2 1.0 - 2.5 (calc)   Total Bilirubin 0.5 0.2 - 1.2 mg/dL   Alkaline phosphatase (APISO) 68 35 - 144 U/L   AST 12 10 - 35 U/L   ALT 11 9 - 46 U/L      Assessment & Plan:   Problem List Items Addressed This Visit    Screening for prostate cancer    Slightly elevated PSA trend from 2.3 to 2.7 to 3.6 now Likely this is normal trend with BPH gradual increase and his known LUTS  Fam history brother with possible prostate [facility] with [facility] D[facility]  Plan Repeat PSA in 6 months as discussed trend monitoring, if still elevated >4 then can consider return to Urology for further management of elevated PSA  Also note in future if start finasteride would be aware of PSA reduction      Obesity (BMI 35.0-39.9 without comorbidity)    Abnormal weight gain recently Encourage lifestyle diet exercise      Insomnia    Chronic insomnia, previously  managed by [facility] PHQ negative today Maintained on Seroquel 50mg  nightly  I agreed to take over this rx now as long as insomnia and mood remain stable, and med is working, if we need to adjust or change or discuss other therapy - I would ask that he be referred back to Psychiatry to follow-up      Relevant Medications   QUEtiapine (SEROQUEL) 50 MG tablet   Hyperlipidemia    Controlled cholesterol on statin and lifestyle Last lipid panel 01/2019 Calculated ASCVD 10 yr risk score elevated  Plan: 1. Continue current meds - Atorvastatin 40mg  daily 2. Continue ASA 81mg  for primary ASCVD  risk reduction 3. Encourage improved lifestyle - low carb/cholesterol, reduce portion size, continue improving regular exercise      Essential hypertension    Improved BP No known complications Failed HCTZ (hyponatremia)  Plan: 1. Continue Amlodipine 10mg  daily, Carvedilol 6.25mg  BID, Doxazosin 2mg  BID 2. Closely watch BP at home, monitor regularly, notify office if elevated BP >140/90 persistently - discussion on goal BP < 150, despite patient not interested in lower BP again as he experienced in past, he is comfortable with lesser control and understands risks 4. Encouraged will need to work on gradual improving exercise, continue hydration, low sodium diet 5. Follow-up 6 months      Elevated hemoglobin A1c    Stable controlled A1c 5.7 Without worsening Encourage lifestyle intervention Check yearly a1c      BPH with obstruction/lower urinary tract symptoms    Stable to mildly improved chronic BPH with LUTS primarily nocturia. Limited management in past - AUA BPH score elevated in past >20 - Last PSA 3.6 (prior 2.3 > 2.7) - [facility] - No known personal/family history of prostate CA - fam history of brother with prostate biopsy/BPH Failed Myrbetriq for possible OAB due to cost  Plan: 1. Continue current dose Doxazosin 2mg  BID for now - cannot tolerate higher  dose up to 4mg  per  Considered Finasteride, but ultimately patient prefer a bladder medicine to start, Detrol not covered, Myrbetriq too costly, will trial on Oxybutynin XR 5mg  daily can adjust dose up as tolerated, precautions on side effects anti cholinergic given, in future may reconsider Finasteride will have to monitor PSA accordingly for decrease.  He declines to return [facility] this issue      Relevant Medications   oxybutynin (DITROPAN-XL) 5 MG 24 hr tablet   Atrial fibrillation (HCC)    Chronic Atrial Fibrillation On Anticoagulation Followed by Cardiology       Other Visit Diagnoses    Annual physical exam    -  Primary   OAB (overactive bladder)       Relevant Medications   oxybutynin (DITROPAN-XL) 5 MG 24 hr tablet      Updated Health Maintenance information Reviewed recent lab results with patient Encouraged improvement to lifestyle with diet and exercise - Goal of weight loss    Meds ordered this encounter  Medications  . oxybutynin (DITROPAN-XL) 5 MG 24 hr tablet    Sig: Take 1 tablet (5 mg total) by mouth at bedtime.    Dispense:  30 tablet    Refill:  2  . QUEtiapine (SEROQUEL) 50 MG tablet    Sig: Take 1 tablet (50 mg total) by mouth at bedtime.    Dispense:  90 tablet    Refill:  1    Keep on hold or refills on file until ready, switched prescribers    Follow up plan: Return in about 6 months (around 07/17/2019) for 6 month follow-up lab results, PSA, BPH/OAB, Insomnia.   Future lab PSA ordered 06/17/19  Saralyn PilarAlexander Karamalegos, DO [facility] 01/17/2019, 10:56 AM

## 2019-01-17 NOTE — Assessment & Plan Note (Signed)
Abnormal weight gain recently Encourage lifestyle diet exercise

## 2019-01-17 NOTE — Assessment & Plan Note (Signed)
Chronic Atrial Fibrillation On Anticoagulation Followed by Cardiology 

## 2019-01-17 NOTE — Patient Instructions (Addendum)
Thank you for coming to the office today.  I can re order the Seroquel for now, in future if we need to change anything we need to re-establish back with Psychiatry, they got a new provider for you, call them and tell them what you wish to do - if we need to adjust this in future we will need to refer you back to them.  For over active bladder / urinary frequency / prostate - try the Oxybutynin medication once a day we can increase dose in future if need. - Also Urologist can help with bladder if need  Repeat PSA in 6 months  Future finasteride for prostate if need  DUE for NON FASTING BLOOD WORK   SCHEDULE "Lab Only" visit in the morning at the clinic for lab draw in 6 MONTHS   - Make sure Lab Only appointment is at about 1 week before your next appointment, so that results will be available  For Lab Results, once available within 2-3 days of blood draw, you can can log in to MyChart online to view your results and a brief explanation. Also, we can discuss results at next follow-up visit.    Please schedule a Follow-up Appointment to: Return in about 6 months (around 07/17/2019) for 6 month follow-up lab results, PSA, BPH/OAB, Insomnia.  If you have any other questions or concerns, please feel free to call the office or send a message through [place]. You may also schedule an earlier appointment if necessary.  Additionally, you may be receiving a survey about your experience at our office within a few days to 1 week by e-mail or mail. We value your feedback.  Nobie Putnam, DO [place]

## 2019-01-17 NOTE — Assessment & Plan Note (Signed)
Stable to mildly improved chronic BPH with LUTS primarily nocturia. Limited management in past - AUA BPH score elevated in past >20 - Last PSA 3.6 (prior 2.3 > 2.7) - [facility] - No known personal/family history of prostate CA - fam history of brother with prostate biopsy/BPH Failed Myrbetriq for possible OAB due to cost  Plan: 1. Continue current dose Doxazosin 2mg  BID for now - cannot tolerate higher dose up to 4mg  per  Considered Finasteride, but ultimately patient prefer a bladder medicine to start, Detrol not covered, Myrbetriq too costly, will trial on Oxybutynin XR 5mg  daily can adjust dose up as tolerated, precautions on side effects anti cholinergic given, in future may reconsider Finasteride will have to monitor PSA accordingly for decrease.  He declines to return to [facility] for this issue

## 2019-01-17 NOTE — Assessment & Plan Note (Addendum)
Slightly elevated PSA trend from 2.3 to 2.7 to 3.6 now Likely this is normal trend with BPH gradual increase and his known LUTS  Fam history brother with possible prostate CA [facility] with normal DRE  Plan Repeat PSA in 6 months as discussed trend monitoring, if still elevated >4 then can consider return to Urology for further management of elevated PSA  Also note in future if start finasteride would be aware of PSA reduction

## 2019-01-17 NOTE — Assessment & Plan Note (Signed)
Stable controlled A1c 5.7 Without worsening Encourage lifestyle intervention Check yearly a1c 

## 2019-01-17 NOTE — Assessment & Plan Note (Addendum)
Improved BP No known complications Failed HCTZ (hyponatremia)  Plan: 1. Continue Amlodipine 10mg  daily, Carvedilol 6.25mg  BID, Doxazosin 2mg  BID 2. Closely watch BP at home, monitor regularly, notify office if elevated BP >140/90 persistently - discussion on goal BP < 150, despite patient not interested in lower BP again as he experienced in past, he is comfortable with lesser control and understands risks 4. Encouraged will need to work on gradual improving exercise, continue hydration, low sodium diet 5. Follow-up 6 months

## 2019-01-17 NOTE — Assessment & Plan Note (Signed)
Chronic insomnia, previously managed by [facility] PHQ negative today Maintained on Seroquel 50mg nightly  I agreed to take over this rx now as long as insomnia and mood remain stable, and med is working, if we need to adjust or change or discuss other therapy - I would ask that he be referred back to Psychiatry to follow-up 

## 2019-01-17 NOTE — Assessment & Plan Note (Addendum)
Controlled cholesterol on statin and lifestyle Last lipid panel 01/2019 Calculated ASCVD 10 yr risk score elevated  Plan: 1. Continue current meds - Atorvastatin 40mg  daily 2. Continue ASA 81mg  for primary ASCVD risk reduction 3. Encourage improved lifestyle - low carb/cholesterol, reduce portion size, continue improving regular exercise

## 2019-02-08 ENCOUNTER — Other Ambulatory Visit: Payer: Medicare Other

## 2019-03-07 DIAGNOSIS — H353131 Nonexudative age-related macular degeneration, bilateral, early dry stage: Secondary | ICD-10-CM | POA: Diagnosis not present

## 2019-03-07 DIAGNOSIS — H40003 Preglaucoma, unspecified, bilateral: Secondary | ICD-10-CM | POA: Diagnosis not present

## 2019-04-10 ENCOUNTER — Other Ambulatory Visit: Payer: Self-pay | Admitting: Family Medicine

## 2019-04-10 DIAGNOSIS — N138 Other obstructive and reflux uropathy: Secondary | ICD-10-CM

## 2019-04-10 DIAGNOSIS — N3281 Overactive bladder: Secondary | ICD-10-CM

## 2019-04-29 ENCOUNTER — Ambulatory Visit: Payer: Medicare Other | Attending: Internal Medicine

## 2019-04-29 ENCOUNTER — Other Ambulatory Visit: Payer: Self-pay

## 2019-04-29 DIAGNOSIS — Z23 Encounter for immunization: Secondary | ICD-10-CM

## 2019-04-29 NOTE — Progress Notes (Signed)
   Covid-19 Vaccination Clinic  Name:  Erik Palmer    MRN: [id number] DOB: April 14, 1934  04/29/2019  [name] was observed post Covid-19 immunization for 15 minutes without incidence. He was provided with Vaccine Information Sheet and instruction to access the V-Safe system.   [name] was instructed to call 911 with any severe reactions post vaccine: [name] Difficulty breathing  . Swelling of your face and throat  . A fast heartbeat  . A bad rash all over your body  . Dizziness and weakness    Immunizations Administered    Name Date Dose VIS Date Route   Pfizer COVID-19 Vaccine 04/29/2019 12:38 PM 0.3 mL 02/11/2019 Intramuscular   Manufacturer: [employer], [employer]   Lot: [id number]   NDC: [address]-9

## 2019-05-23 ENCOUNTER — Ambulatory Visit: Payer: Medicare Other | Admitting: Psychiatry

## 2019-05-31 ENCOUNTER — Ambulatory Visit: Payer: Medicare Other | Attending: Internal Medicine

## 2019-05-31 DIAGNOSIS — Z23 Encounter for immunization: Secondary | ICD-10-CM

## 2019-05-31 NOTE — Progress Notes (Signed)
   Covid-19 Vaccination Clinic  Name:  Erik Palmer    MRN: [id number] DOB: 12-17-1934  05/31/2019  [name] was observed post Covid-19 immunization for 15 minutes without incident. He was provided with Vaccine Information Sheet and instruction to access the V-Safe system.   [name] was instructed to call 911 with any severe reactions post vaccine: [name] Difficulty breathing  . Swelling of face and throat  . A fast heartbeat  . A bad rash all over body  . Dizziness and weakness   Immunizations Administered    Name Date Dose VIS Date Route   Pfizer COVID-19 Vaccine 05/31/2019 11:48 AM 0.3 mL 02/11/2019 Intramuscular   Manufacturer: [employer]   Lot: [id number]   NDC: [address]-8

## 2019-06-17 ENCOUNTER — Other Ambulatory Visit: Payer: Self-pay | Admitting: Family Medicine

## 2019-06-17 ENCOUNTER — Other Ambulatory Visit: Payer: Medicare Other

## 2019-06-17 ENCOUNTER — Other Ambulatory Visit: Payer: Self-pay

## 2019-06-17 DIAGNOSIS — N138 Other obstructive and reflux uropathy: Secondary | ICD-10-CM | POA: Diagnosis not present

## 2019-06-17 LAB — PSA: PSA: 3.2 ng/mL (ref <=4.0)

## 2019-06-27 ENCOUNTER — Other Ambulatory Visit: Payer: Self-pay | Admitting: Family Medicine

## 2019-06-27 NOTE — Telephone Encounter (Signed)
>   3 months overdue for OV- pt has OV in 23 days. Filled with enough to last until upcoming appt. Requested Prescriptions  Pending Prescriptions Disp Refills  . omeprazole (PRILOSEC) 20 MG capsule [Pharmacy Med Name: OMEPRAZOLE DR 20 MG CAPSULE] 46 capsule 0    Sig: TAKE 1 CAPSULE BY MOUTH TWICE A DAY     Gastroenterology: Proton Pump Inhibitors Failed - 06/27/2019 10:02 AM      Failed - Valid encounter within last 12 months    Recent Outpatient Visits          5 months ago Annual physical exam   [facility] Smitty Cords, DO   1 year ago BPH with obstruction/lower urinary tract symptoms   [facility] Althea Charon, Netta Neat, DO   1 year ago Annual physical exam   [facility] Smitty Cords, DO   2 years ago Essential hypertension   [facility] Smitty Cords, DO   2 years ago Essential hypertension   [facility] Althea Charon, Netta Neat, DO      Future Appointments            In 3 weeks Althea Charon, Netta Neat, DO [facility]

## 2019-06-28 ENCOUNTER — Other Ambulatory Visit: Payer: Self-pay | Admitting: Family Medicine

## 2019-06-28 DIAGNOSIS — N138 Other obstructive and reflux uropathy: Secondary | ICD-10-CM

## 2019-06-28 DIAGNOSIS — N401 Enlarged prostate with lower urinary tract symptoms: Secondary | ICD-10-CM

## 2019-06-28 DIAGNOSIS — I10 Essential (primary) hypertension: Secondary | ICD-10-CM

## 2019-06-28 NOTE — Telephone Encounter (Signed)
Requested Prescriptions  Pending Prescriptions Disp Refills  . doxazosin (CARDURA) 2 MG tablet [Pharmacy Med Name: DOXAZOSIN MESYLATE 2 MG TAB] 180 tablet 0    Sig: TAKE 1 TABLET (2 MG TOTAL) BY MOUTH 2 (TWO) TIMES DAILY.     Cardiovascular:  Alpha Blockers Failed - 06/28/2019  3:10 AM      Failed - Valid encounter within last 6 months    Recent Outpatient Visits          5 months ago Annual physical exam   [facility] Smitty Cords, DO   1 year ago BPH with obstruction/lower urinary tract symptoms   [facility] Althea Charon, Netta Neat, DO   1 year ago Annual physical exam   [facility] Smitty Cords, DO   2 years ago Essential hypertension   [facility] Smitty Cords, DO   2 years ago Essential hypertension   [facility] Althea Charon, Netta Neat, DO      Future Appointments            In 3 weeks Althea Charon, Netta Neat, DO [facility]           Passed - Last BP in normal range    BP Readings from Last 1 Encounters:  01/17/19 136/74         Patient had a valid encounter 5 months ago for a physical.

## 2019-07-04 ENCOUNTER — Other Ambulatory Visit: Payer: Self-pay | Admitting: Family Medicine

## 2019-07-04 DIAGNOSIS — N401 Enlarged prostate with lower urinary tract symptoms: Secondary | ICD-10-CM

## 2019-07-04 DIAGNOSIS — N138 Other obstructive and reflux uropathy: Secondary | ICD-10-CM

## 2019-07-04 DIAGNOSIS — N3281 Overactive bladder: Secondary | ICD-10-CM

## 2019-07-04 NOTE — Telephone Encounter (Signed)
Refilled with enough medication to get to upcoming appt Requested Prescriptions  Pending Prescriptions Disp Refills  . oxybutynin (DITROPAN-XL) 5 MG 24 hr tablet [Pharmacy Med Name: OXYBUTYNIN CL ER 5 MG TABLET] 15 tablet 0    Sig: TAKE 1 TABLET BY MOUTH EVERYDAY AT BEDTIME     Urology:  Bladder Agents Failed - 07/04/2019 10:52 AM      Failed - Valid encounter within last 12 months    Recent Outpatient Visits          5 months ago Annual physical exam   [facility] Smitty Cords, DO   1 year ago BPH with obstruction/lower urinary tract symptoms   [facility] Althea Charon, Netta Neat, DO   1 year ago Annual physical exam   [facility] Smitty Cords, DO   2 years ago Essential hypertension   [facility] Smitty Cords, DO   2 years ago Essential hypertension   [facility] Althea Charon, Netta Neat, DO      Future Appointments            In 2 weeks Althea Charon, Netta Neat, DO [facility]           \

## 2019-07-09 ENCOUNTER — Other Ambulatory Visit: Payer: Self-pay | Admitting: Family Medicine

## 2019-07-09 DIAGNOSIS — J3089 Other allergic rhinitis: Secondary | ICD-10-CM

## 2019-07-09 NOTE — Telephone Encounter (Signed)
Requested Prescriptions  Pending Prescriptions Disp Refills  . fluticasone (FLONASE) 50 MCG/ACT nasal spray [Pharmacy Med Name: FLUTICASONE PROP 50 MCG SPRAY] 48 mL 1    Sig: USE 2 SPRAYS IN EACH NOSTRIL EVERY DAY     Ear, Nose, and Throat: Nasal Preparations - Corticosteroids Failed - 07/09/2019  9:51 AM      Failed - Valid encounter within last 12 months    Recent Outpatient Visits          5 months ago Annual physical exam   [facility] Smitty Cords, DO   1 year ago BPH with obstruction/lower urinary tract symptoms   [facility] Althea Charon, Netta Neat, DO   1 year ago Annual physical exam   [facility] Smitty Cords, DO   2 years ago Essential hypertension   [facility] Smitty Cords, DO   2 years ago Essential hypertension   [facility] Althea Charon, Netta Neat, DO      Future Appointments            In 1 week Althea Charon, Netta Neat, DO [facility], [facility]

## 2019-07-14 ENCOUNTER — Other Ambulatory Visit: Payer: Self-pay | Admitting: Family Medicine

## 2019-07-14 DIAGNOSIS — N138 Other obstructive and reflux uropathy: Secondary | ICD-10-CM

## 2019-07-14 DIAGNOSIS — N3281 Overactive bladder: Secondary | ICD-10-CM

## 2019-07-14 DIAGNOSIS — N401 Enlarged prostate with lower urinary tract symptoms: Secondary | ICD-10-CM

## 2019-07-15 ENCOUNTER — Other Ambulatory Visit: Payer: Self-pay | Admitting: Family Medicine

## 2019-07-15 DIAGNOSIS — E782 Mixed hyperlipidemia: Secondary | ICD-10-CM

## 2019-07-15 NOTE — Telephone Encounter (Signed)
Requested Prescriptions  Pending Prescriptions Disp Refills  . atorvastatin (LIPITOR) 40 MG tablet [Pharmacy Med Name: ATORVASTATIN 40 MG TABLET] 90 tablet 3    Sig: TAKE 1 TABLET BY MOUTH EVERYDAY AT BEDTIME     Cardiovascular:  Antilipid - Statins Failed - 07/15/2019  1:25 AM      Failed - Valid encounter within last 12 months    Recent Outpatient Visits          5 months ago Annual physical exam   [facility] Smitty Cords, DO   1 year ago BPH with obstruction/lower urinary tract symptoms   [facility] Althea Charon, Netta Neat, DO   1 year ago Annual physical exam   [facility] Smitty Cords, DO   2 years ago Essential hypertension   [facility] Smitty Cords, DO   2 years ago Essential hypertension   [facility], Netta Neat, DO      Future Appointments            In 4 days Althea Charon, Netta Neat, DO [facility]           Passed - Total Cholesterol in normal range and within 360 days    Cholesterol, Total  Date Value Ref Range Status  05/21/2015 185 100 - 199 mg/dL Final   Cholesterol  Date Value Ref Range Status  01/10/2019 157 <200 mg/dL Final         Passed - LDL in normal range and within 360 days    LDL Cholesterol (Calc)  Date Value Ref Range Status  01/10/2019 90 mg/dL (calc) Final    Comment:    Reference range: <100 . Desirable range <100 mg/dL for primary prevention;   <70 mg/dL for patients with CHD or diabetic patients  with > or = 2 CHD risk factors. [name] LDL-C is now calculated using the Martin-Hopkins  calculation, which is a validated novel method providing  better accuracy than the Friedewald equation in the  estimation of LDL-C.  Horald Pollen et al. Lenox Ahr. 5956;387(56): 2061-2068  (http://education.[employer].com/faq/FAQ164)          Passed - HDL in normal range and within 360 days    HDL   Date Value Ref Range Status  01/10/2019 44 > OR = 40 mg/dL Final  43/32/9518 45 >84 mg/dL Final         Passed - Triglycerides in normal range and within 360 days    Triglycerides  Date Value Ref Range Status  01/10/2019 129 <150 mg/dL Final         Passed - Patient is not pregnant      valid encounter 5 months ago.

## 2019-07-18 ENCOUNTER — Other Ambulatory Visit: Payer: Self-pay | Admitting: Family Medicine

## 2019-07-19 ENCOUNTER — Other Ambulatory Visit: Payer: Self-pay | Admitting: Family Medicine

## 2019-07-19 ENCOUNTER — Other Ambulatory Visit: Payer: Self-pay

## 2019-07-19 ENCOUNTER — Encounter: Payer: Self-pay | Admitting: Family Medicine

## 2019-07-19 ENCOUNTER — Ambulatory Visit (INDEPENDENT_AMBULATORY_CARE_PROVIDER_SITE_OTHER): Payer: Medicare Other | Admitting: Family Medicine

## 2019-07-19 VITALS — BP 144/60 | HR 58 | Temp 97.5°F | Resp 16 | Ht 69.0 in | Wt 238.0 lb

## 2019-07-19 DIAGNOSIS — N3281 Overactive bladder: Secondary | ICD-10-CM | POA: Diagnosis not present

## 2019-07-19 DIAGNOSIS — F5104 Psychophysiologic insomnia: Secondary | ICD-10-CM

## 2019-07-19 DIAGNOSIS — Z125 Encounter for screening for malignant neoplasm of prostate: Secondary | ICD-10-CM

## 2019-07-19 DIAGNOSIS — N138 Other obstructive and reflux uropathy: Secondary | ICD-10-CM

## 2019-07-19 DIAGNOSIS — E782 Mixed hyperlipidemia: Secondary | ICD-10-CM

## 2019-07-19 DIAGNOSIS — N401 Enlarged prostate with lower urinary tract symptoms: Secondary | ICD-10-CM

## 2019-07-19 DIAGNOSIS — D509 Iron deficiency anemia, unspecified: Secondary | ICD-10-CM

## 2019-07-19 DIAGNOSIS — R7309 Other abnormal glucose: Secondary | ICD-10-CM

## 2019-07-19 DIAGNOSIS — K219 Gastro-esophageal reflux disease without esophagitis: Secondary | ICD-10-CM

## 2019-07-19 DIAGNOSIS — F3342 Major depressive disorder, recurrent, in full remission: Secondary | ICD-10-CM | POA: Insufficient documentation

## 2019-07-19 DIAGNOSIS — F330 Major depressive disorder, recurrent, mild: Secondary | ICD-10-CM

## 2019-07-19 DIAGNOSIS — I10 Essential (primary) hypertension: Secondary | ICD-10-CM

## 2019-07-19 DIAGNOSIS — E669 Obesity, unspecified: Secondary | ICD-10-CM

## 2019-07-19 DIAGNOSIS — Z0000 Encounter for general adult medical examination without abnormal findings: Secondary | ICD-10-CM

## 2019-07-19 MED ORDER — OXYBUTYNIN CHLORIDE ER 10 MG PO TB24: 10.0000 mg | ORAL_TABLET | Freq: Every day | ORAL | 2 refills | Status: DC

## 2019-07-19 MED ORDER — OMEPRAZOLE 20 MG PO CPDR: 20.0000 mg | DELAYED_RELEASE_CAPSULE | Freq: Two times a day (BID) | ORAL | 1 refills | Status: DC

## 2019-07-19 MED ORDER — QUETIAPINE FUMARATE 50 MG PO TABS: 50.0000 mg | ORAL_TABLET | Freq: Every day | ORAL | 1 refills | Status: DC

## 2019-07-19 NOTE — Patient Instructions (Addendum)
Thank you for coming to the office today.  Refilled Seroquel, on file  Refilled stomach acid Omeprazole  Dose increase Oxybutynin from 5 to 10 - take at bedtime, and then after 2 weeks if not improving, can switch to morning.  In future we can try the Detrol again to see if they will pay for it.  DUE for FASTING BLOOD WORK (no food or drink after midnight before the lab appointment, only water or coffee without cream/sugar on the morning of)  SCHEDULE "Lab Only" visit in the morning at the clinic for lab draw in 6 MONTHS   - Make sure Lab Only appointment is at about 1 week before your next appointment, so that results will be available  For Lab Results, once available within 2-3 days of blood draw, you can can log in to MyChart online to view your results and a brief explanation. Also, we can discuss results at next follow-up visit.   Please schedule a Follow-up Appointment to: Return in about 6 months (around 01/19/2020) for Annual Physical.  If you have any other questions or concerns, please feel free to call the office or send a message through MyChart. You may also schedule an earlier appointment if necessary.  Additionally, you may be receiving a survey about your experience at our office within a few days to 1 week by e-mail or mail. We value your feedback.  Saralyn Pilar, DO [facility], [place]

## 2019-07-19 NOTE — Progress Notes (Signed)
Subjective:    Patient ID: Erik Palmer, male    DOB: Nov 30, 1934, 85 y.o.   MRN: [id number]  Erik Palmer is a 85 y.o. male presenting on 07/19/2019 for Hypertension (6 month follow up)  Accompanied by wife, Karan Ramnauth, who provides additional history.  HPI   BPH with LUTS [facility] and [facility] and DRE showed normal exam by report, he was treated for UTI. Improved on Doxazosin currentlybut still complaints of significant LUTS.He has had issues with bladder spasms and urinary frequency but reduced volume and LUTS Obstructive symptoms - Previously last seen 01/2019 by me we tried to order Myrbetriq but cost too high, Detrol LA was not covered or preferred. He was initiated on oxybutynin XL 5mg  nightly, he has done fairly well but it has not resolved his nocturia and urinary LUTS. - He asks about taking higher dose or during the day - He is not ready to return to Urologist - See PSA below, last lab done 06/17/19  Major Depression recurrent mild / Insomnia Followed by [facility] has managed well on Seroquel 50mg  nightly for insomnia, his psych has left and they attempted to re-schedule him with new provider unsure if this has happened, he requests that we are able to continue his seroquel for insomnia. He has not required any dose adjust - Taking Seroquel 50mg  nightly currently and doing well   Health Maintenance:  Elevated PSA / Prostate CA Screening: PSA trend 2.3 > to 2.7 (07/2017 - at [facility]) now recent trend 3.6 (01/2019) - Now new lab result PSA 3.2 (06/17/19) - shown improvement with his trend.  Fam history brother with possible prostate cancer unsure diagnosis, [facility]  UTD COVID[facility]  Depression screen [facility] 2/9 07/19/2019 01/17/2019 11/16/2018  Decreased Interest 0 0 0  Down, Depressed, Hopeless 0 0 0  PHQ - 2 Score 0 0 0  Altered sleeping 1 2 -  Tired, decreased energy 0 0 -  Change in appetite 0 0 -  Feeling bad or  failure about yourself  0 0 -  Trouble concentrating 0 0 -  Moving slowly or fidgety/restless 0 0 -  Suicidal thoughts 0 0 -  PHQ-9 Score 1 2 -  Difficult doing work/chores Not difficult at all Somewhat difficult -    Social History   Tobacco Use  . Smoking status: Never Smoker  . Smokeless tobacco: Never Used  Substance Use Topics  . Alcohol use: No    Alcohol/week: 0.0 standard drinks  . Drug use: No    Review of Systems Per HPI unless specifically indicated above     Objective:    BP (!) 144/60   Pulse (!) 58   Temp (!) 97.5 F (36.4 C) (Temporal)   Resp 16   Ht 5\' 9"  (1.753 m)   Wt 238 lb (108 kg)   SpO2 98%   BMI 35.15 kg/m   Wt Readings from Last 3 Encounters:  07/19/19 238 lb (108 kg)  01/17/19 242 lb (109.8 kg)  11/16/18 235 lb (106.6 kg)    Physical Exam Vitals and nursing note reviewed.  Constitutional:      General: He is not in acute distress.    Appearance: He is well-developed. He is not diaphoretic.     Comments: Well-appearing, comfortable, cooperative  HENT:     Head: Normocephalic and atraumatic.  Eyes:     General:        Right eye: No discharge.  Left eye: No discharge.     Conjunctiva/sclera: Conjunctivae normal.  Cardiovascular:     Rate and Rhythm: Normal rate.  Pulmonary:     Effort: Pulmonary effort is normal.  Skin:    General: Skin is warm and dry.     Findings: No erythema or rash.  Neurological:     Mental Status: He is alert and oriented to person, place, and time.  Psychiatric:        Behavior: Behavior normal.     Comments: Well groomed, good eye contact, normal speech and thoughts        Results for orders placed or performed in visit on 06/17/19  PSA  Result Value Ref Range   PSA 3.2 < OR = 4.0 ng/mL      Assessment & Plan:   Problem List Items Addressed This Visit    Screening for prostate cancer - Primary    Improved PSA from 3.6 down to 3.2 now in past 6 months Prior trend 2.3 to 2.7 in past  few year. BPH LUTS Nocturia  Fam history brother with possible prostate CA [facility] with [facility] D[facility]  Plan Reassurance down trend PSA Continue treating BPH / OAB Repeat PSA in 6 months as discussed trend monitoring, if still elevated >4 then can consider return to Urology for further management of elevated PSA  Also note in future if start finasteride would be aware of PSA reduction      MDD (major depressive disorder), recurrent episode, mild (HCC)    Stable Controlled on med management Insomnia improved Still has nocturia See PHQ      Insomnia    Chronic insomnia, previously managed by [facility] PHQ negative today Maintained on Seroquel 50mg  nightly  I agreed to take over this rx now as long as insomnia and mood remain stable, and med is working, if we need to adjust or change or discuss other therapy - I would ask that he be referred back to Psychiatry to follow-up      Relevant Medications   QUEtiapine (SEROQUEL) 50 MG tablet   BPH with obstruction/lower urinary tract symptoms    Stable to mildly improved chronic BPH with LUTS primarily nocturia - Last PSA 3.2 (improved from 3.6, previously 2-3 range) - [facility] D[facility] per [facility] - No known personal/family history of prostate CA - fam history of brother with prostate biopsy/BPH Failed Myrbetriq for possible OAB due to cost  Plan: 1. Continue current dose Doxazosin 2mg  BID for now - cannot tolerate higher dose up to 4mg  per 2. Increase dose Oxybutynin XL from 5 to 10mg  new rx sent, ins did not cover Detrol LA, and he could not afford cost of Myrbetriq. Considered Finasteride but concern with PSA monitoring in future He declines to return to [facility] for this issue      Relevant Medications   oxybutynin (DITROPAN-XL) 10 MG 24 hr tablet    Other Visit Diagnoses    OAB (overactive bladder)       Relevant Medications   oxybutynin (DITROPAN-XL) 10 MG 24 hr tablet       Meds ordered this  encounter  Medications  . oxybutynin (DITROPAN-XL) 10 MG 24 hr tablet    Sig: Take 1 tablet (10 mg total) by mouth at bedtime. May switch to morning in future if preferred    Dispense:  30 tablet    Refill:  2    Dose increase  . QUEtiapine (SEROQUEL) 50 MG tablet  Sig: Take 1 tablet (50 mg total) by mouth at bedtime.    Dispense:  90 tablet    Refill:  1    Keep on hold or refills on file until ready     Follow up plan: Return in about 6 months (around 01/19/2020) for Annual Physical.  Future labs ordered for 01/12/20   Saralyn Pilar, DO [facility] 07/19/2019, 11:15 AM

## 2019-07-19 NOTE — Assessment & Plan Note (Signed)
Chronic insomnia, previously managed by [facility] PHQ negative today Maintained on Seroquel 50mg  nightly  I agreed to take over this rx now as long as insomnia and mood remain stable, and med is working, if we need to adjust or change or discuss other therapy - I would ask that he be referred back to Psychiatry to follow-up

## 2019-07-19 NOTE — Assessment & Plan Note (Signed)
Stable to mildly improved chronic BPH with LUTS primarily nocturia - Last PSA 3.2 (improved from 3.6, previously 2-3 range) - Last DRE reported normal per [place] - No known personal/family history of prostate CA - fam history of brother with prostate biopsy/BPH Failed Myrbetriq for possible OAB due to cost  Plan: 1. Continue current dose Doxazosin 2mg  BID for now - cannot tolerate higher dose up to 4mg  per 2. Increase dose Oxybutynin XL from 5 to 10mg  new rx sent, ins did not cover Detrol LA, and he could not afford cost of Myrbetriq. Considered Finasteride but concern with PSA monitoring in future He declines to return to [facility] for this issue

## 2019-07-19 NOTE — Assessment & Plan Note (Signed)
Stable Controlled on med management Insomnia improved Still has nocturia See PHQ

## 2019-07-19 NOTE — Assessment & Plan Note (Signed)
Improved PSA from 3.6 down to 3.2 now in past 6 months Prior trend 2.3 to 2.7 in past few year. BPH LUTS Nocturia  Fam history brother with possible prostate CA [facility] with [facility] DRE  Plan Reassurance down trend PSA Continue treating BPH / OAB Repeat PSA in 6 months as discussed trend monitoring, if still elevated >4 then can consider return to Urology for further management of elevated PSA  Also note in future if start finasteride would be aware of PSA reduction

## 2019-09-23 ENCOUNTER — Other Ambulatory Visit: Payer: Self-pay | Admitting: Family Medicine

## 2019-09-23 DIAGNOSIS — N138 Other obstructive and reflux uropathy: Secondary | ICD-10-CM

## 2019-09-23 DIAGNOSIS — N401 Enlarged prostate with lower urinary tract symptoms: Secondary | ICD-10-CM

## 2019-09-23 DIAGNOSIS — I10 Essential (primary) hypertension: Secondary | ICD-10-CM

## 2019-09-30 LAB — BASIC METABOLIC PANEL
BUN: 10 (ref 4–21)
Creatinine: 0.8 (ref 0.6–1.3)
Glucose: 96
Potassium: 4.2 (ref 3.4–5.3)
Sodium: 142 (ref 137–147)

## 2019-09-30 LAB — LIPID PANEL
Cholesterol: 164 (ref 0–200)
HDL: 40 (ref 35–70)
LDL Cholesterol: 86
Triglycerides: 161 — AB (ref 40–160)

## 2019-09-30 LAB — CBC AND DIFFERENTIAL
HCT: 38 — AB (ref 41–53)
Hemoglobin: 12.7 — AB (ref 13.5–17.5)
Platelets: 308 (ref 150–399)
WBC: 6.6

## 2019-09-30 LAB — COMPREHENSIVE METABOLIC PANEL: Calcium: 9.2 (ref 8.7–10.7)

## 2019-10-19 ENCOUNTER — Other Ambulatory Visit: Payer: Self-pay | Admitting: Family Medicine

## 2019-10-19 DIAGNOSIS — N401 Enlarged prostate with lower urinary tract symptoms: Secondary | ICD-10-CM

## 2019-10-19 DIAGNOSIS — N3281 Overactive bladder: Secondary | ICD-10-CM

## 2019-10-19 DIAGNOSIS — N138 Other obstructive and reflux uropathy: Secondary | ICD-10-CM

## 2019-10-19 NOTE — Telephone Encounter (Signed)
Requested Prescriptions  Pending Prescriptions Disp Refills   oxybutynin (DITROPAN-XL) 10 MG 24 hr tablet [Pharmacy Med Name: OXYBUTYNIN CL ER 10 MG TABLET] 90 tablet     Sig: TAKE 1 TABLET BY MOUTH EVERY NIGHT AT BEDTIME. MAY SWITCH TO MORNING IF PREFERRED *DOSE INCREASE     Urology:  Bladder Agents Passed - 10/19/2019  1:22 AM      Passed - Valid encounter within last 12 months    Recent Outpatient Visits          3 months ago Screening for prostate cancer   [facility] Smitty Cords, DO   9 months ago Annual physical exam   [facility] Smitty Cords, DO   1 year ago BPH with obstruction/lower urinary tract symptoms   [facility] Althea Charon, Netta Neat, DO   2 years ago Annual physical exam   [facility] Smitty Cords, DO   2 years ago Essential hypertension   [facility] Althea Charon, Netta Neat, DO      Future Appointments            In 3 months Althea Charon, Netta Neat, DO [facility], [facility]

## 2019-12-25 ENCOUNTER — Other Ambulatory Visit: Payer: Self-pay | Admitting: Family Medicine

## 2019-12-25 DIAGNOSIS — N138 Other obstructive and reflux uropathy: Secondary | ICD-10-CM

## 2019-12-25 DIAGNOSIS — I10 Essential (primary) hypertension: Secondary | ICD-10-CM

## 2019-12-25 NOTE — Telephone Encounter (Signed)
Requested Prescriptions  Pending Prescriptions Disp Refills  . doxazosin (CARDURA) 2 MG tablet [Pharmacy Med Name: DOXAZOSIN MESYLATE 2 MG TAB] 180 tablet 0    Sig: TAKE 1 TABLET (2 MG TOTAL) BY MOUTH 2 (TWO) TIMES DAILY.     Cardiovascular:  Alpha Blockers Failed - 12/25/2019  9:05 AM      Failed - Last BP in normal range    BP Readings from Last 1 Encounters:  07/19/19 (!) 144/60         Passed - Valid encounter within last 6 months    Recent Outpatient Visits          5 months ago Screening for prostate cancer   [facility] Smitty Cords, DO   11 months ago Annual physical exam   [facility] Smitty Cords, DO   1 year ago BPH with obstruction/lower urinary tract symptoms   [facility] Smitty Cords, DO   2 years ago Annual physical exam   [facility] Smitty Cords, DO   3 years ago Essential hypertension   [facility] Althea Charon, Netta Neat, DO      Future Appointments            In 3 weeks Althea Charon, Netta Neat, DO [facility], [facility]

## 2020-01-04 ENCOUNTER — Other Ambulatory Visit: Payer: Self-pay | Admitting: Family Medicine

## 2020-01-04 DIAGNOSIS — E782 Mixed hyperlipidemia: Secondary | ICD-10-CM

## 2020-01-04 NOTE — Telephone Encounter (Signed)
Requested Prescriptions  Pending Prescriptions Disp Refills  . atorvastatin (LIPITOR) 40 MG tablet [Pharmacy Med Name: ATORVASTATIN 40 MG TABLET] 90 tablet 0    Sig: TAKE 1 TABLET BY MOUTH EVERYDAY AT BEDTIME     Cardiovascular:  Antilipid - Statins Passed - 01/04/2020  1:22 AM      Passed - Total Cholesterol in normal range and within 360 days    Cholesterol, Total  Date Value Ref Range Status  05/21/2015 185 100 - 199 mg/dL Final   Cholesterol  Date Value Ref Range Status  01/10/2019 157 <200 mg/dL Final         Passed - LDL in normal range and within 360 days    LDL Cholesterol (Calc)  Date Value Ref Range Status  01/10/2019 90 mg/dL (calc) Final    Comment:    Reference range: <100 . Desirable range <100 mg/dL for primary prevention;   <70 mg/dL for patients with CHD or diabetic patients  with > or = 2 CHD risk factors. [name] LDL-C is now calculated using the Martin-Hopkins  calculation, which is a validated novel method providing  better accuracy than the Friedewald equation in the  estimation of LDL-C.  Horald Pollen et al. Lenox Ahr. 1027;253(66): 2061-2068  (http://education.[facility].com/faq/FAQ164)          Passed - HDL in normal range and within 360 days    HDL  Date Value Ref Range Status  01/10/2019 44 > OR = 40 mg/dL Final  44/05/4740 45 >59 mg/dL Final         Passed - Triglycerides in normal range and within 360 days    Triglycerides  Date Value Ref Range Status  01/10/2019 129 <150 mg/dL Final         Passed - Patient is not pregnant      Passed - Valid encounter within last 12 months    Recent Outpatient Visits          5 months ago Screening for prostate cancer   [facility] Smitty Cords, DO   11 months ago Annual physical exam   [facility] Smitty Cords, DO   1 year ago BPH with obstruction/lower urinary tract symptoms   [facility] Althea Charon, Netta Neat, DO   2 years  ago Annual physical exam   [facility] Smitty Cords, DO   3 years ago Essential hypertension   [facility] Althea Charon, Netta Neat, DO      Future Appointments            In 2 weeks Althea Charon, Netta Neat, DO [facility], [facility]

## 2020-01-11 ENCOUNTER — Other Ambulatory Visit: Payer: Self-pay | Admitting: *Deleted

## 2020-01-11 ENCOUNTER — Other Ambulatory Visit: Payer: Self-pay | Admitting: Family Medicine

## 2020-01-11 DIAGNOSIS — N138 Other obstructive and reflux uropathy: Secondary | ICD-10-CM

## 2020-01-11 DIAGNOSIS — D509 Iron deficiency anemia, unspecified: Secondary | ICD-10-CM

## 2020-01-11 DIAGNOSIS — E669 Obesity, unspecified: Secondary | ICD-10-CM

## 2020-01-11 DIAGNOSIS — I10 Essential (primary) hypertension: Secondary | ICD-10-CM

## 2020-01-11 DIAGNOSIS — Z0000 Encounter for general adult medical examination without abnormal findings: Secondary | ICD-10-CM

## 2020-01-11 DIAGNOSIS — K219 Gastro-esophageal reflux disease without esophagitis: Secondary | ICD-10-CM

## 2020-01-11 DIAGNOSIS — E782 Mixed hyperlipidemia: Secondary | ICD-10-CM

## 2020-01-11 DIAGNOSIS — R7309 Other abnormal glucose: Secondary | ICD-10-CM

## 2020-01-11 DIAGNOSIS — N401 Enlarged prostate with lower urinary tract symptoms: Secondary | ICD-10-CM

## 2020-01-11 NOTE — Telephone Encounter (Signed)
Requested Prescriptions  Pending Prescriptions Disp Refills  . omeprazole (PRILOSEC) 20 MG capsule [Pharmacy Med Name: OMEPRAZOLE DR 20 MG CAPSULE] 180 capsule 1    Sig: TAKE 1 CAPSULE BY MOUTH TWICE A DAY     Gastroenterology: Proton Pump Inhibitors Passed - 01/11/2020  1:45 AM      Passed - Valid encounter within last 12 months    Recent Outpatient Visits          5 months ago Screening for prostate cancer   [facility] Smitty Cords, DO   11 months ago Annual physical exam   [facility] Smitty Cords, DO   1 year ago BPH with obstruction/lower urinary tract symptoms   [facility] Smitty Cords, DO   2 years ago Annual physical exam   [facility] Smitty Cords, DO   3 years ago Essential hypertension   [facility] Althea Charon, Netta Neat, DO      Future Appointments            In 1 week Althea Charon, Netta Neat, DO [facility], [facility]

## 2020-01-12 ENCOUNTER — Other Ambulatory Visit: Payer: Medicare Other

## 2020-01-12 ENCOUNTER — Other Ambulatory Visit: Payer: Self-pay

## 2020-01-12 DIAGNOSIS — I10 Essential (primary) hypertension: Secondary | ICD-10-CM | POA: Diagnosis not present

## 2020-01-12 DIAGNOSIS — R7309 Other abnormal glucose: Secondary | ICD-10-CM | POA: Diagnosis not present

## 2020-01-12 DIAGNOSIS — E782 Mixed hyperlipidemia: Secondary | ICD-10-CM | POA: Diagnosis not present

## 2020-01-13 ENCOUNTER — Other Ambulatory Visit: Payer: Self-pay | Admitting: Family Medicine

## 2020-01-13 DIAGNOSIS — J3089 Other allergic rhinitis: Secondary | ICD-10-CM

## 2020-01-13 LAB — LIPID PANEL
Cholesterol: 169 mg/dL (ref <200)
HDL: 46 mg/dL (ref >=40)
LDL Cholesterol (Calc): 96 mg/dL (calc)
Non-HDL Cholesterol (Calc): 123 mg/dL (calc) (ref <130)
Total CHOL/HDL Ratio: 3.7 (calc) (ref <5.0)
Triglycerides: 167 mg/dL — ABNORMAL HIGH (ref <150)

## 2020-01-13 LAB — HEMOGLOBIN A1C
Hgb A1c MFr Bld: 5.7 % of total Hgb — ABNORMAL HIGH (ref <5.7)
Mean Plasma Glucose: 117 (calc)
eAG (mmol/L): 6.5 (calc)

## 2020-01-13 LAB — COMPLETE METABOLIC PANEL WITH GFR
AG Ratio: 2 (calc) (ref 1.0–2.5)
ALT: 12 U/L (ref 9–46)
AST: 14 U/L (ref 10–35)
Albumin: 4.3 g/dL (ref 3.6–5.1)
Alkaline phosphatase (APISO): 72 U/L (ref 35–144)
BUN: 11 mg/dL (ref 7–25)
CO2: 27 mmol/L (ref 20–32)
Calcium: 9.4 mg/dL (ref 8.6–10.3)
Chloride: 106 mmol/L (ref 98–110)
Creat: 0.77 mg/dL (ref 0.70–1.11)
GFR, Est African American: 97 mL/min/{1.73_m2} (ref >=60)
GFR, Est Non African American: 84 mL/min/{1.73_m2} (ref >=60)
Globulin: 2.2 g/dL (calc) (ref 1.9–3.7)
Glucose, Bld: 94 mg/dL (ref 65–99)
Potassium: 4.1 mmol/L (ref 3.5–5.3)
Sodium: 141 mmol/L (ref 135–146)
Total Bilirubin: 0.6 mg/dL (ref 0.2–1.2)
Total Protein: 6.5 g/dL (ref 6.1–8.1)

## 2020-01-13 LAB — CBC WITH DIFFERENTIAL/PLATELET
Absolute Monocytes: 599 cells/uL (ref 200–950)
Basophils Absolute: 28 cells/uL (ref 0–200)
Basophils Relative: 0.5 %
Eosinophils Absolute: 101 cells/uL (ref 15–500)
Eosinophils Relative: 1.8 %
HCT: 37.9 % — ABNORMAL LOW (ref 38.5–50.0)
Hemoglobin: 12.3 g/dL — ABNORMAL LOW (ref 13.2–17.1)
Lymphs Abs: 1742 cells/uL (ref 850–3900)
MCH: 30.6 pg (ref 27.0–33.0)
MCHC: 32.5 g/dL (ref 32.0–36.0)
MCV: 94.3 fL (ref 80.0–100.0)
MPV: 10.6 fL (ref 7.5–12.5)
Monocytes Relative: 10.7 %
Neutro Abs: 3130 cells/uL (ref 1500–7800)
Neutrophils Relative %: 55.9 %
Platelets: 305 10*3/uL (ref 140–400)
RBC: 4.02 10*6/uL — ABNORMAL LOW (ref 4.20–5.80)
RDW: 12.7 % (ref 11.0–15.0)
Total Lymphocyte: 31.1 %
WBC: 5.6 10*3/uL (ref 3.8–10.8)

## 2020-01-13 LAB — PSA: PSA: 4.12 ng/mL — ABNORMAL HIGH (ref <=4.0)

## 2020-01-13 LAB — TSH: TSH: 3.41 mIU/L (ref 0.40–4.50)

## 2020-01-19 ENCOUNTER — Encounter: Payer: Self-pay | Admitting: Family Medicine

## 2020-01-19 ENCOUNTER — Ambulatory Visit (INDEPENDENT_AMBULATORY_CARE_PROVIDER_SITE_OTHER): Payer: Medicare Other | Admitting: Family Medicine

## 2020-01-19 ENCOUNTER — Other Ambulatory Visit: Payer: Self-pay

## 2020-01-19 VITALS — BP 130/68 | HR 64 | Temp 98.0°F | Ht 69.0 in | Wt 243.0 lb

## 2020-01-19 DIAGNOSIS — I10 Essential (primary) hypertension: Secondary | ICD-10-CM | POA: Diagnosis not present

## 2020-01-19 DIAGNOSIS — N401 Enlarged prostate with lower urinary tract symptoms: Secondary | ICD-10-CM

## 2020-01-19 DIAGNOSIS — Z0000 Encounter for general adult medical examination without abnormal findings: Secondary | ICD-10-CM | POA: Diagnosis not present

## 2020-01-19 DIAGNOSIS — R7309 Other abnormal glucose: Secondary | ICD-10-CM

## 2020-01-19 DIAGNOSIS — F3342 Major depressive disorder, recurrent, in full remission: Secondary | ICD-10-CM

## 2020-01-19 DIAGNOSIS — E669 Obesity, unspecified: Secondary | ICD-10-CM

## 2020-01-19 DIAGNOSIS — E782 Mixed hyperlipidemia: Secondary | ICD-10-CM

## 2020-01-19 DIAGNOSIS — I4821 Permanent atrial fibrillation: Secondary | ICD-10-CM

## 2020-01-19 DIAGNOSIS — F5104 Psychophysiologic insomnia: Secondary | ICD-10-CM

## 2020-01-19 DIAGNOSIS — N138 Other obstructive and reflux uropathy: Secondary | ICD-10-CM

## 2020-01-19 MED ORDER — QUETIAPINE FUMARATE 50 MG PO TABS: 50.0000 mg | ORAL_TABLET | Freq: Every day | ORAL | 3 refills | Status: DC

## 2020-01-19 MED ORDER — AMLODIPINE BESYLATE 5 MG PO TABS: 5.0000 mg | ORAL_TABLET | Freq: Every day | ORAL | 3 refills | Status: DC

## 2020-01-19 MED ORDER — ATORVASTATIN CALCIUM 40 MG PO TABS: 40.0000 mg | ORAL_TABLET | Freq: Every day | ORAL | 3 refills | Status: DC

## 2020-01-19 MED ORDER — DOXAZOSIN MESYLATE 2 MG PO TABS: 2.0000 mg | ORAL_TABLET | Freq: Two times a day (BID) | ORAL | 3 refills | Status: DC

## 2020-01-19 NOTE — Assessment & Plan Note (Signed)
Stable controlled A1c 5.7 Without worsening Encourage lifestyle intervention Check yearly a1c

## 2020-01-19 NOTE — Assessment & Plan Note (Signed)
Controlled cholesterol on statin and lifestyle Last lipid panel 01/2020 Calculated ASCVD 10 yr risk score elevated  Plan: 1. Continue current meds - Atorvastatin 40mg  daily 2. Continue ASA 81mg  for primary ASCVD risk reduction 3. Encourage improved lifestyle - low carb/cholesterol, reduce portion size, continue improving regular exercise

## 2020-01-19 NOTE — Progress Notes (Signed)
Subjective:    Patient ID: Erik Palmer, male    DOB: 11-15-1934, 85 y.o.   MRN: [id number]  Erik Palmer is a 85 y.o. male presenting on 01/19/2020 for Annual Exam   HPI   Here for Annual Physical and Lab Review. [facility], abstracted.  Elevated A1c A1c average 5.6 to 5.7, last lab 5.7 stable without new concerns. CBGs:not checking CBG regularly Meds:never on med Currentlynot onACEi / ARB Lifestyle: - Diet (improved diet)  - Exercise (limited but he is improving exercise) Denies hypoglycemia  Cardiology / History of Atrial Fibrillation / HTN Previously dx with chronic AFibt,cardiology has recommended to continue eliquis for anticoag Current Meds - Amlodipine 5mg  daily (based on med rec dose has been lowered), Carvedilol 6.25mg  BID, Doxazosin 2mg  BID   Reports good compliance, took meds today. Tolerating well, w/o complaints.  HYPERLIPIDEMIA: - Reports no concerns. Last lipid panel 01/2019, mostly controlled  - Currently taking Atorvastatin 40mg , tolerating well without side effects or myalgias  BPH with LUTS [facility], and [facility] did [facility] evaluation and DRE showed normal exam by report, he was treated for UTI. Improved on Doxazosin currentlybut still complaints of significant LUTS.He has had issues with bladder spasms and urinary frequency but reduced volume and LUTS Obstructive symptoms - Previously last seen 01/2019 by me we tried to order Myrbetriq but cost too high, Detrol LA was not covered or preferred. He was initiated on oxybutynin XL 5mg  nightly, he has done fairly well but it has not resolved his nocturia and urinary LUTS. - He asks about taking higher dose or during the day - He is not ready to return to Urologist - See PSA below, last lab done 06/17/19  Major Depression recurrent mild vs remission / Insomnia Taking Seroquel 50mg  nightly currently and doing well   Health Maintenance:  Elevated PSA /  Prostate CA Screening: PSA trend 2.3 > to 2.7 (07/2017 - at [facility]) now recent trend 3.6 (01/2019) to 3.2 (06/2019) now last updated PSA 01/2020 was PSA 4.12  Fam history brother with possible prostate cancer unsure diagnosis, [facility]  UTD COVID19 vaccine  Declines Flu Shot.  Depression screen [facility] 2/9 01/19/2020 07/19/2019 01/17/2019  Decreased Interest 0 0 0  Down, Depressed, Hopeless 0 0 0  PHQ - 2 Score 0 0 0  Altered sleeping 1 1 2   Tired, decreased energy 0 0 0  Change in appetite 0 0 0  Feeling bad or failure about yourself  0 0 0  Trouble concentrating 0 0 0  Moving slowly or fidgety/restless 0 0 0  Suicidal thoughts 0 0 0  PHQ-9 Score 1 1 2   Difficult doing work/chores Not difficult at all Not difficult at all Somewhat difficult    Past Medical History:  Diagnosis Date  . Angina pectoris syndrome (HCC)   . 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 Arthritis   . GERD (gastroesophageal reflux disease)   . Grief at loss of child 09/03/2014  . Hypercholesteremia   . Hypertension   . Prostate enlargement    Past Surgical History:  Procedure Laterality Date  . CORONARY ARTERY BYPASS GRAFT  03/25/2017   at [facility]  . TOTAL HIP ARTHROPLASTY Right 12/05/2015   Procedure: TOTAL HIP ARTHROPLASTY ANTERIOR APPROACH;  Surgeon: , MD;  Location: [facility] ORS;  Service: Orthopedics;  Laterality: Right;   Social History   Socioeconomic History  . Marital status: Married    Spouse  name: Not on file  . Number of children: Not on file  . Years of education: Not on file  . Highest education level: Not on file  Occupational History  . Occupation: retired  Tobacco Use  . Smoking status: Never Smoker  . Smokeless tobacco: Never Used  Vaping Use  . Vaping Use: Never used  Substance and Sexual Activity  . Alcohol use: No    Alcohol/week: 0.0 standard drinks  . Drug use: No  . Sexual activity: Not Currently  Other Topics Concern  . Not on file  Social  History Narrative  . Not on file   Social Determinants of Health   Financial Resource Strain:   . Difficulty of Paying Living Expenses: Not on file  Food Insecurity:   . Worried About Programme researcher, broadcasting/film/videounning Out of Food in the Last Year: Not on file  . Ran Out of Food in the Last Year: Not on file  Transportation Needs:   . Lack of Transportation (Medical): Not on file  . Lack of Transportation (Non-Medical): Not on file  Physical Activity:   . Days of Exercise per Week: Not on file  . Minutes of Exercise per Session: Not on file  Stress:   . Feeling of Stress : Not on file  Social Connections:   . Frequency of Communication with Friends and Family: Not on file  . Frequency of Social Gatherings with Friends and Family: Not on file  . Attends Religious Services: Not on file  . Active Member of Clubs or Organizations: Not on file  . Attends BankerClub or Organization Meetings: Not on file  . Marital Status: Not on file  Intimate Partner Violence:   . Fear of Current or Ex-Partner: Not on file  . Emotionally Abused: Not on file  . Physically Abused: Not on file  . Sexually Abused: Not on file   Family History  Problem Relation Age of Onset  . Hypertension Mother   . Heart block Mother   . Stroke Mother   . Depression Mother   . Cancer Father   . Heart attack Sister   . COPD Sister   . Hypertension Brother   . Hypertension Brother   . Hypertension Brother   . Hypertension Brother   . Heart attack Brother   . Diabetes Neg Hx    Current Outpatient Medications on File Prior to Visit  Medication Sig  . acetaminophen (TYLENOL) 650 MG CR tablet Take 650 mg by mouth 2 (two) times daily. Reported on 05/08/2015  . apixaban (ELIQUIS) 5 MG TABS tablet Take 5 mg by mouth 2 (two) times daily.  [name]. aspirin EC 81 MG tablet Take 81 mg by mouth daily.  . carvedilol (COREG) 6.25 MG tablet Take 6.25 mg by mouth 2 (two) times daily with a meal.  . omeprazole (PRILOSEC) 20 MG capsule TAKE 1 CAPSULE BY MOUTH TWICE  A DAY  . polyethylene glycol powder (GLYCOLAX/MIRALAX) powder Take 17 g by mouth daily.  . cholecalciferol (VITAMIN D) 400 units TABS tablet Take 400 Units by mouth daily. (Patient not taking: Reported on 01/19/2020)   No current facility-administered medications on file prior to visit.    Review of Systems  Constitutional: Negative for activity change, appetite change, chills, diaphoresis, fatigue and fever.  HENT: Negative for congestion and hearing loss.   Eyes: Negative for visual disturbance.  Respiratory: Negative for apnea, cough, chest tightness, shortness of breath and wheezing.   Cardiovascular: Negative for chest pain, palpitations and leg swelling.  Gastrointestinal: Negative for abdominal pain, constipation, diarrhea, nausea and vomiting.  Endocrine: Negative for cold intolerance.  Genitourinary: Negative for difficulty urinating, dysuria, frequency and hematuria.  Musculoskeletal: Negative for arthralgias and neck pain.  Skin: Negative for rash.  Allergic/Immunologic: Negative for environmental allergies.  Neurological: Negative for dizziness, weakness, light-headedness, numbness and headaches.  Hematological: Negative for adenopathy.  Psychiatric/Behavioral: Negative for behavioral problems, dysphoric mood and sleep disturbance.   Per HPI unless specifically indicated above      Objective:    BP 130/68 (BP Location: Left Arm, Cuff Size: Normal)   Pulse 64   Temp 98 F (36.7 C) (Oral)   Ht 5\' 9"  (1.753 m)   Wt 243 lb (110.2 kg)   BMI 35.88 kg/m   Wt Readings from Last 3 Encounters:  01/19/20 243 lb (110.2 kg)  07/19/19 238 lb (108 kg)  01/17/19 242 lb (109.8 kg)    Physical Exam Vitals and nursing note reviewed.  Constitutional:      General: He is not in acute distress.    Appearance: He is well-developed. He is not diaphoretic.     Comments: Well-appearing, comfortable, cooperative  HENT:     Head: Normocephalic and atraumatic.  Eyes:     General:         Right eye: No discharge.        Left eye: No discharge.     Conjunctiva/sclera: Conjunctivae normal.     Pupils: Pupils are equal, round, and reactive to light.  Neck:     Thyroid: No thyromegaly.  Cardiovascular:     Rate and Rhythm: Normal rate and regular rhythm.     Heart sounds: Normal heart sounds. No murmur heard.   Pulmonary:     Effort: Pulmonary effort is normal. No respiratory distress.     Breath sounds: Normal breath sounds. No wheezing or rales.  Abdominal:     General: Bowel sounds are normal. There is no distension.     Palpations: Abdomen is soft. There is no mass.     Tenderness: There is no abdominal tenderness.  Musculoskeletal:        General: No tenderness. Normal range of motion.     Cervical back: Normal range of motion and neck supple.     Right lower leg: Edema present.     Left lower leg: Edema present.     Comments: Upper / Lower Extremities: - Normal muscle tone, strength bilateral upper extremities 5/5, lower extremities 5/5  Lymphadenopathy:     Cervical: No cervical adenopathy.  Skin:    General: Skin is warm and dry.     Findings: No erythema or rash.  Neurological:     Mental Status: He is alert and oriented to person, place, and time.     Comments: Distal sensation intact to light touch all extremities  Psychiatric:        Behavior: Behavior normal.     Comments: Well groomed, good eye contact, normal speech and thoughts    Results for orders placed or performed in visit on 01/19/20  CBC and differential  Result Value Ref Range   Hemoglobin 12.7 (A) 13.5 - 17.5   HCT 38 (A) 41 - 53   Platelets 308 150 - 399   WBC 6.6   Basic metabolic panel  Result Value Ref Range   Glucose 96    BUN 10 4 - 21   Creatinine 0.8 0.6 - 1.3   Potassium 4.2 3.4 - 5.3   Sodium  142 137 - 147  Comprehensive metabolic panel  Result Value Ref Range   Calcium 9.2 8.7 - 10.7  Lipid panel  Result Value Ref Range   Triglycerides 161 (A) 40 - 160    Cholesterol 164 0 - 200   HDL 40 35 - 70   LDL Cholesterol 86       Assessment & Plan:   Problem List Items Addressed This Visit    Obesity (BMI 35.0-39.9 without comorbidity)   Major depression, recurrent, full remission (HCC)    Remission On Seroquel No longer w/ Psych      Insomnia    Chronic insomnia, previously managed by [facility] PHQ negative today Maintained on Seroquel  nightly  , if we need to adjust or change or discuss other therapy - I would ask that he be referred back to Psychiatry to follow-up      Relevant Medications   QUEtiapine (SEROQUEL) 50 MG tablet   Hyperlipidemia    Controlled cholesterol on statin and lifestyle Last lipid panel 01/2020 Calculated ASCVD 10 yr risk score elevated  Plan: 1. Continue current meds - Atorvastatin  daily 2. Continue ASA  for primary ASCVD risk reduction 3. Encourage improved lifestyle - low carb/cholesterol, reduce portion size, continue improving regular exercise      Relevant Medications   amLODipine (NORVASC) 5 MG tablet   atorvastatin (LIPITOR) 40 MG tablet   doxazosin (CARDURA) 2 MG tablet   Essential hypertension    Elevated BP, repeat improved No known complications Failed HCTZ (hyponatremia)  Plan: 1. Continue Amlodipine  daily, Carvedilol 6.25mg  BID, Doxazosin  BID 2. Closely watch BP at home, monitor regularly, notify office if elevated BP >140/90 persistently - discussion on goal BP < 150, despite patient not interested in lower BP again as he experienced in past, he is comfortable with lesser control and understands risks 4. Encouraged will need to work on gradual improving exercise, continue hydration, low sodium diet 5. Follow-up 6 months      Relevant Medications   amLODipine (NORVASC) 5 MG tablet   atorvastatin (LIPITOR) 40 MG tablet   doxazosin (CARDURA) 2 MG tablet   Elevated hemoglobin A1c    Stable controlled A1c 5.7 Without worsening Encourage lifestyle  intervention Check yearly a1c      BPH with obstruction/lower urinary tract symptoms    Stable chronic BPH with LUTS primarily nocturia - Last PSA increased from 3.2 to 4.12 now - Last DRE reported normal per [place] - No known personal/family history of prostate CA - fam history of brother with prostate biopsy/BPH Failed Myrbetriq for possible OAB due to cost  Plan: 1. Continue current dose Doxazosin  BID for now - cannot tolerate higher dose up to  per 2. Increase dose Oxybutynin XL from 5 to  new rx sent, ins did not cover Detrol LA, and he could not afford cost of Myrbetriq. Considered Finasteride but concern with PSA monitoring in future  Advised him to return to [facility] for further PSA monitor and BPH treatment      Relevant Medications   doxazosin (CARDURA) 2 MG tablet   Atrial fibrillation (HCC)    Chronic Atrial Fibrillation On Anticoagulation Followed by Cardiology      Relevant Medications   amLODipine (NORVASC) 5 MG tablet   atorvastatin (LIPITOR) 40 MG tablet   doxazosin (CARDURA) 2 MG tablet    Other Visit Diagnoses    Annual physical exam    -  Primary  Updated Health Maintenance information Reviewed recent lab results with patient Encouraged improvement to lifestyle with diet and exercise - Goal of weight loss   Meds ordered this encounter  Medications  . amLODipine (NORVASC) 5 MG tablet    Sig: Take 1 tablet (5 mg total) by mouth daily.    Dispense:  90 tablet    Refill:  3  . atorvastatin (LIPITOR) 40 MG tablet    Sig: Take 1 tablet (40 mg total) by mouth daily.    Dispense:  90 tablet    Refill:  3  . doxazosin (CARDURA) 2 MG tablet    Sig: Take 1 tablet (2 mg total) by mouth 2 (two) times daily.    Dispense:  180 tablet    Refill:  3  . QUEtiapine (SEROQUEL) 50 MG tablet    Sig: Take 1 tablet (50 mg total) by mouth at bedtime.    Dispense:  90 tablet    Refill:  3    Keep on hold or refills on file until ready      Follow  up plan: Return in about 6 months (around 07/18/2020) for 6 month follow-up PreDM A1c, HTN (morning apt may need labs for PSA).  Saralyn Pilar, DO [facility] 01/19/2020, 11:43 AM

## 2020-01-19 NOTE — Assessment & Plan Note (Signed)
Elevated BP, repeat improved No known complications Failed HCTZ (hyponatremia)  Plan: 1. Continue Amlodipine 5mg  daily, Carvedilol 6.25mg  BID, Doxazosin 2mg  BID 2. Closely watch BP at home, monitor regularly, notify office if elevated BP >140/90 persistently - discussion on goal BP < 150, despite patient not interested in lower BP again as he experienced in past, he is comfortable with lesser control and understands risks 4. Encouraged will need to work on gradual improving exercise, continue hydration, low sodium diet 5. Follow-up 6 months

## 2020-01-19 NOTE — Assessment & Plan Note (Signed)
Stable chronic BPH with LUTS primarily nocturia - Last PSA increased from 3.2 to 4.12 now - Last DRE reported normal per [place] - No known personal/family history of prostate CA - fam history of brother with prostate biopsy/BPH Failed Myrbetriq for possible OAB due to cost  Plan: 1. Continue current dose Doxazosin 2mg  BID for now - cannot tolerate higher dose up to 4mg  per 2. Increase dose Oxybutynin XL from 5 to 10mg  new rx sent, ins did not cover Detrol LA, and he could not afford cost of Myrbetriq. Considered Finasteride but concern with PSA monitoring in future  Advised him to return to [facility] for further PSA monitor and BPH treatment

## 2020-01-19 NOTE — Assessment & Plan Note (Signed)
Remission On Seroquel No longer w/ Psych

## 2020-01-19 NOTE — Assessment & Plan Note (Signed)
Chronic insomnia, previously managed by [facility] PHQ negative today Maintained on Seroquel 50mg  nightly  , if we need to adjust or change or discuss other therapy - I would ask that he be referred back to Psychiatry to follow-up

## 2020-01-19 NOTE — Patient Instructions (Addendum)
Thank you for coming to the office today.  Changed Amlodipine from 10mg  down to 5mg  daily - I believe you were cutting the 10mg  tab in HALF and taking half tab daily  I have sent new rx Amlodipine for 5mg  once daily.  Recommend return to [facility] Urologist for further management of overactive bladder / BPH  PSA Prostate Cancer Screening - increased PSA up to 4.12, gradual increase from prior 3.2  Recommend discussing your PSA with Urologist.  Refilled some medicines today.  Chemistry - Normal results, including electrolytes, kidney and liver function. Normal fasting blood sugar   Hemoglobin A1c (Diabetes screening) - 5.7, mildly elevated in range of Pre-Diabetes (>5.7 to 6.4) - stable from previous  TSH Thyroid Function Tests - Normal.  Cholesterol - Controlled LDL at 96, on statin, mild elevated Triglyceride 167  CBC Blood Counts - Stble mild anemia with Hgb 12.3.  Please schedule a Follow-up Appointment to: Return in about 6 months (around 07/18/2020) for 6 month follow-up PreDM A1c, HTN (morning apt may need labs for PSA).  If you have any other questions or concerns, please feel free to call the office or send a message through MyChart. You may also schedule an earlier appointment if necessary.  Additionally, you may be receiving a survey about your experience at our office within a few days to 1 week by e-mail or mail. We value your feedback.  , DO [facility], [facility]

## 2020-01-19 NOTE — Assessment & Plan Note (Signed)
Chronic Atrial Fibrillation On Anticoagulation Followed by Cardiology 

## 2020-02-07 ENCOUNTER — Ambulatory Visit: Payer: Medicare Other | Attending: Internal Medicine

## 2020-02-07 ENCOUNTER — Other Ambulatory Visit: Payer: Self-pay | Admitting: Internal Medicine

## 2020-02-07 DIAGNOSIS — Z23 Encounter for immunization: Secondary | ICD-10-CM

## 2020-02-07 NOTE — Progress Notes (Signed)
   Covid-19 Vaccination Clinic  Name:  Erik Palmer    MRN: [id number] DOB: 02/07/1935  02/07/2020  [name] was observed post Covid-19 immunization for 15 minutes without incident. He was provided with Vaccine Information Sheet and instruction to access the V-Safe system.   [name] was instructed to call 911 with any severe reactions post vaccine: [name] Difficulty breathing  . Swelling of face and throat  . A fast heartbeat  . A bad rash all over body  . Dizziness and weakness   Immunizations Administered    Name Date Dose VIS Date Route   Pfizer COVID-19 Vaccine 02/07/2020 12:52 PM 0.3 mL 12/21/2019 Intramuscular   Manufacturer: [employer], [employer]   Lot: [id number]   NDC: [address]-2

## 2020-05-07 DIAGNOSIS — H40053 Ocular hypertension, bilateral: Secondary | ICD-10-CM | POA: Diagnosis not present

## 2020-06-06 ENCOUNTER — Telehealth: Payer: Self-pay | Admitting: Family Medicine

## 2020-06-06 NOTE — Telephone Encounter (Signed)
Copied from CRM [id number]. Topic: Medicare AWV >> Jun 06, 2020 11:16 AM Claudette Laws R wrote: Reason for CRM:  Left message for patient to call back and schedule the Medicare Annual Wellness Visit (AWV) virtually or by telephone.  Last AWV 11/16/2018  Please schedule at anytime with [facility] Advisor.  40 minute appointment  Any questions, please call me at [phone]

## 2020-07-11 ENCOUNTER — Other Ambulatory Visit: Payer: Self-pay | Admitting: Family Medicine

## 2020-07-11 DIAGNOSIS — J3089 Other allergic rhinitis: Secondary | ICD-10-CM

## 2020-07-11 NOTE — Telephone Encounter (Signed)
Requested medications are due for refill today.  unknown  Requested medications are on the active medications list.  no  Last refill. unknown  Future visit scheduled.   yes  Notes to clinic.  Medication was discontinued on 01/19/2020. Please advise.

## 2020-07-25 ENCOUNTER — Ambulatory Visit: Payer: Medicare Other | Admitting: Family Medicine

## 2020-07-26 ENCOUNTER — Ambulatory Visit: Payer: Medicare Other | Admitting: Family Medicine

## 2020-08-03 ENCOUNTER — Encounter: Payer: Self-pay | Admitting: Family Medicine

## 2020-08-03 ENCOUNTER — Other Ambulatory Visit: Payer: Self-pay

## 2020-08-03 ENCOUNTER — Ambulatory Visit (INDEPENDENT_AMBULATORY_CARE_PROVIDER_SITE_OTHER): Payer: Medicare Other | Admitting: Family Medicine

## 2020-08-03 VITALS — BP 148/74 | HR 71 | Ht 69.0 in | Wt 236.8 lb

## 2020-08-03 DIAGNOSIS — R350 Frequency of micturition: Secondary | ICD-10-CM | POA: Diagnosis not present

## 2020-08-03 DIAGNOSIS — N3001 Acute cystitis with hematuria: Secondary | ICD-10-CM | POA: Diagnosis not present

## 2020-08-03 DIAGNOSIS — K219 Gastro-esophageal reflux disease without esophagitis: Secondary | ICD-10-CM

## 2020-08-03 DIAGNOSIS — R7309 Other abnormal glucose: Secondary | ICD-10-CM | POA: Diagnosis not present

## 2020-08-03 DIAGNOSIS — R972 Elevated prostate specific antigen [PSA]: Secondary | ICD-10-CM

## 2020-08-03 DIAGNOSIS — N138 Other obstructive and reflux uropathy: Secondary | ICD-10-CM

## 2020-08-03 DIAGNOSIS — R310 Gross hematuria: Secondary | ICD-10-CM | POA: Diagnosis not present

## 2020-08-03 DIAGNOSIS — N401 Enlarged prostate with lower urinary tract symptoms: Secondary | ICD-10-CM

## 2020-08-03 DIAGNOSIS — R319 Hematuria, unspecified: Secondary | ICD-10-CM | POA: Diagnosis not present

## 2020-08-03 LAB — POCT URINALYSIS DIPSTICK
Bilirubin, UA: NEGATIVE
Glucose, UA: NEGATIVE
Ketones, UA: NEGATIVE
Nitrite, UA: POSITIVE
Protein, UA: POSITIVE — AB
Spec Grav, UA: 1.01 (ref 1.010–1.025)
Urobilinogen, UA: 0.2 E.U./dL
pH, UA: 7 (ref 5.0–8.0)

## 2020-08-03 MED ORDER — CEPHALEXIN 500 MG PO CAPS: 500.0000 mg | ORAL_CAPSULE | Freq: Three times a day (TID) | ORAL | 0 refills | Status: DC

## 2020-08-03 NOTE — Progress Notes (Signed)
Subjective:    Patient ID: Erik Palmer, male    DOB: 1934/04/08, 86 y.o.   MRN: [id number]  Erik Palmer is a 86 y.o. male presenting on 08/03/2020 for Prediabetes, Hypertension, and Urinary Frequency   HPI   Elevated A1c A1c average 5.6 to 5.7, last lab 5.7 stable without new concerns. CBGs:not checking CBG regularly Meds:never on med Currentlynot onACEi / ARB Lifestyle: - Diet (improved diet) - Exercise (limited but he is improving exercise) Denies hypoglycemia  PMH - Cardiology / History of Atrial Fibrillation / HTN  BPH with LUTS Gross Hematuria UTI [facility] and [facility] and DRE showed normal exam by report, he was treated for UTI. Improved on Doxazosin currentlybut still complaints of significant LUTS.He has had issues with bladder spasms and urinary frequency but reduced volume and LUTS Obstructive symptoms - Previously last seen 01/2019 by me we tried to order Myrbetriq but cost too high, Detrol LA was not covered or preferred. He was initiated on oxybutynin XL 5mg  nightly, he has done fairly well but it has not resolved his nocturia and urinary LUTS.  He is unsure if he has Oxybutynin at home, not on his handwritten list today He will check at home. [facility] has [facility] in [facility]. Prior PSA elevated. Will recheck today He has dysuria at times, cloudy urine, and has seen gross hematuria. Due for urine now  Major Depression recurrent mild vs remission Taking Seroquel 50mg  nightly currently and doing well   Health Maintenance:  Elevated PSA /Prostate CA Screening: PSA trend 2.3 > to 2.7 (07/2017 - at [facility]) nowrecent trend3.6 (01/2019) to 3.2 (06/2019) now last updated PSA 01/2020 was PSA 4.12 - Due for PSA today.   Depression screen [facility] 2/9 08/03/2020 01/19/2020 07/19/2019  Decreased Interest 0 0 0  Down, Depressed, Hopeless 0 0 0  PHQ - 2 Score 0 0 0  Altered sleeping 0 1 1  Tired,  decreased energy 0 0 0  Change in appetite 0 0 0  Feeling bad or failure about yourself  0 0 0  Trouble concentrating 0 0 0  Moving slowly or fidgety/restless 0 0 0  Suicidal thoughts 0 0 0  PHQ-9 Score 0 1 1  Difficult doing work/chores Not difficult at all Not difficult at all Not difficult at all  Some recent data might be hidden    Social History   Tobacco Use  . Smoking status: Never Smoker  . Smokeless tobacco: Never Used  Vaping Use  . Vaping Use: Never used  Substance Use Topics  . Alcohol use: No    Alcohol/week: 0.0 standard drinks  . Drug use: No    Review of Systems Per HPI unless specifically indicated above     Objective:    BP (!) 148/74 (BP Location: Left Arm, Cuff Size: Normal)   Pulse 71   Ht 5\' 9"  (1.753 m)   Wt 236 lb 12.8 oz (107.4 kg)   SpO2 99%   BMI 34.97 kg/m   Wt Readings from Last 3 Encounters:  08/03/20 236 lb 12.8 oz (107.4 kg)  01/19/20 243 lb (110.2 kg)  07/19/19 238 lb (108 kg)    Physical Exam Vitals and nursing note reviewed.  Constitutional:      General: He is not in acute distress.    Appearance: He is well-developed. He is not diaphoretic.     Comments: Well-appearing, comfortable, cooperative  HENT:  Head: Normocephalic and atraumatic.  Eyes:     General:        Right eye: No discharge.        Left eye: No discharge.     Conjunctiva/sclera: Conjunctivae normal.  Cardiovascular:     Rate and Rhythm: Normal rate.  Pulmonary:     Effort: Pulmonary effort is normal.  Skin:    General: Skin is warm and dry.     Findings: No erythema or rash.  Neurological:     Mental Status: He is alert and oriented to person, place, and time.  Psychiatric:        Behavior: Behavior normal.     Comments: Well groomed, good eye contact, normal speech and thoughts    Results for orders placed or performed in visit on 08/03/20  POCT Urinalysis Dipstick  Result Value Ref Range   Color, UA Yellow    Clarity, UA Cloudy     Glucose, UA Negative Negative   Bilirubin, UA Negative    Ketones, UA Negative    Spec Grav, UA 1.010 1.010 - 1.025   Blood, UA Large +++    pH, UA 7.0 5.0 - 8.0   Protein, UA Positive (A) Negative   Urobilinogen, UA 0.2 0.2 or 1.0 E.U./dL   Nitrite, UA Positive    Leukocytes, UA Moderate (2+) (A) Negative   Appearance     Odor        Assessment & Plan:   Problem List Items Addressed This Visit    Elevated hemoglobin A1c   Relevant Orders   Hemoglobin A1c   BPH with obstruction/lower urinary tract symptoms   Relevant Orders   BASIC METABOLIC PANEL WITH GFR    Other Visit Diagnoses    Acute cystitis with hematuria    -  Primary   Relevant Medications   cephALEXin (KEFLEX) 500 MG capsule   Urinary frequency       Relevant Medications   cephALEXin (KEFLEX) 500 MG capsule   Other Relevant Orders   POCT Urinalysis Dipstick (Completed)   Urine Culture   Gross hematuria       Relevant Orders   Urine Culture   Gastroesophageal reflux disease, unspecified whether esophagitis present       Relevant Medications   Docusate Sodium (DSS) 100 MG CAPS   omeprazole (PRILOSEC) 20 MG capsule   Elevated PSA       Relevant Orders   PSA      Elevated PSA BPH LUTS Previously followed by [facility], question if Urology last time. He has been on variety of meds, alpha blocker, Oxybuytnin etc. Mixed results. Unsure what he is currently taking, has handwritten list that is incomplete.  Prior PSA 4.12 Repeat PSA today, discussion that he has UTI and may elevate PSA  UTI Acute cystitis Gross hematuria reported - as discussed above, he will need to return to Urologist for further management.  Urine dipstick today Start Keflex 500mg  TID x 7 days Urine culture [facility] as planned.  A1c elevated previously 5.7 Check today on lab.   Meds ordered this encounter  Medications  . cephALEXin (KEFLEX) 500 MG capsule    Sig: Take 1 capsule (500 mg total) by mouth 3 (three) times  daily. For 7 days    Dispense:  21 capsule    Refill:  0      Follow up plan: Return in about 6 months (around 02/02/2021).  Labs in AM after review prior labs from  [facility] in [facility]21  Saralyn Pilar, DO [facility] 08/03/2020, 8:57 AM

## 2020-08-03 NOTE — Patient Instructions (Addendum)
Thank you for coming to the office today.  [facility] -1st floor [address] [place],  [place]  [address] Phone: [phone]  Recommend Dr Richardo Hanks or Dr Apolinar Junes for Urology.  -------------------  You may have a urinary tract infection  We will treat with antibiotic.  1. You have a Urinary Tract Infection - this is very common, your symptoms are reassuring and you should get better within 1 week on the antibiotics - Start Keflex 500mg  3 times daily for next 7 days, complete entire course, even if feeling better - We sent urine for a culture, we will call you within next few days if we need to change antibiotics - Please drink plenty of fluids, improve hydration over next 1 week  If symptoms worsening, developing nausea / vomiting, worsening back pain, fevers / chills / sweats, then please return for re-evaluation sooner.  Return to Urology.  Please schedule a Follow-up Appointment to: Return in about 6 months (around 02/02/2021) for 6 month Annual Physical (AM Apt, labs after, review [facility] results).  If you have any other questions or concerns, please feel free to call the office or send a message through MyChart. You may also schedule an earlier appointment if necessary.  Additionally, you may be receiving a survey about your experience at our office within a few days to 1 week by e-mail or mail. We value your feedback.  14/05/2020, DO [facility], [facility]

## 2020-08-04 LAB — BASIC METABOLIC PANEL WITH GFR
BUN: 12 mg/dL (ref 7–25)
CO2: 26 mmol/L (ref 20–32)
Calcium: 9.3 mg/dL (ref 8.6–10.3)
Chloride: 105 mmol/L (ref 98–110)
Creat: 0.73 mg/dL (ref 0.70–1.11)
GFR, Est African American: 99 mL/min/{1.73_m2} (ref >=60)
GFR, Est Non African American: 86 mL/min/{1.73_m2} (ref >=60)
Glucose, Bld: 91 mg/dL (ref 65–99)
Potassium: 4.3 mmol/L (ref 3.5–5.3)
Sodium: 140 mmol/L (ref 135–146)

## 2020-08-04 LAB — HEMOGLOBIN A1C
Hgb A1c MFr Bld: 5.7 % of total Hgb — ABNORMAL HIGH (ref <5.7)
Mean Plasma Glucose: 117 mg/dL
eAG (mmol/L): 6.5 mmol/L

## 2020-08-04 LAB — PSA: PSA: 3.02 ng/mL (ref <=4.00)

## 2020-08-05 LAB — URINE CULTURE
MICRO NUMBER:: 11967136
SPECIMEN QUALITY:: ADEQUATE

## 2020-08-08 ENCOUNTER — Telehealth: Payer: Self-pay

## 2020-08-08 NOTE — Telephone Encounter (Signed)
I called and spoke with the patient about recent labs. He was pleased overall to hear that everything is stable. I let him know that he should finish the Keflex and f/u with urology. Patient agreed with recommendations.

## 2020-10-03 ENCOUNTER — Other Ambulatory Visit: Payer: Self-pay | Admitting: Family Medicine

## 2020-10-03 DIAGNOSIS — K219 Gastro-esophageal reflux disease without esophagitis: Secondary | ICD-10-CM

## 2020-10-03 NOTE — Telephone Encounter (Signed)
Requested medications are due for refill today.  yes  Requested medications are on the active medications list.  yes  Last refill. 08/03/2020  Future visit scheduled.   yes  Notes to clinic.  Historical medication.

## 2020-11-20 ENCOUNTER — Ambulatory Visit (INDEPENDENT_AMBULATORY_CARE_PROVIDER_SITE_OTHER): Payer: Medicare Other

## 2020-11-20 VITALS — Ht 69.0 in | Wt 220.0 lb

## 2020-11-20 DIAGNOSIS — Z0000 Encounter for general adult medical examination without abnormal findings: Secondary | ICD-10-CM

## 2020-11-20 NOTE — Patient Instructions (Signed)
Erik Palmer , Thank you for taking time to come for your Medicare Wellness Visit. I appreciate your ongoing commitment to your health goals. Please review the following plan we discussed and let me know if I can assist you in the future.   Screening recommendations/referrals: Colonoscopy: not required Recommended yearly ophthalmology/optometry visit for glaucoma screening and checkup Recommended yearly dental visit for hygiene and checkup  Vaccinations: Influenza vaccine: decline Pneumococcal vaccine: completed 06/20/2014 Tdap vaccine: completed 06/20/2014, due 06/19/2024 Shingles vaccine: discussed   Covid-19:  02/07/2020, 05/31/2019, 04/29/2019, 04/09/2019  Advanced directives: Please bring a copy of your POA (Power of Attorney) and/or Living Will to your next appointment.   Conditions/risks identified: none  Next appointment: Follow up in one year for your annual wellness visit.   Preventive Care 21 Years and Older, Male Preventive care refers to lifestyle choices and visits with your health care provider that can promote health and wellness. What does preventive care include? A yearly physical exam. This is also called an annual well check. Dental exams once or twice a year. Routine eye exams. Ask your health care provider how often you should have your eyes checked. Personal lifestyle choices, including: Daily care of your teeth and gums. Regular physical activity. Eating a healthy diet. Avoiding tobacco and drug use. Limiting alcohol use. Practicing safe sex. Taking low doses of aspirin every day. Taking vitamin and mineral supplements as recommended by your health care provider. What happens during an annual well check? The services and screenings done by your health care provider during your annual well check will depend on your age, overall health, lifestyle risk factors, and family history of disease. Counseling  Your health care provider may ask you questions about  your: Alcohol use. Tobacco use. Drug use. Emotional well-being. Home and relationship well-being. Sexual activity. Eating habits. History of falls. Memory and ability to understand (cognition). Work and work Astronomer. Screening  You may have the following tests or measurements: Height, weight, and BMI. Blood pressure. Lipid and cholesterol levels. These may be checked every 5 years, or more frequently if you are over 40 years old. Skin check. Lung cancer screening. You may have this screening every year starting at age 57 if you have a 30-pack-year history of smoking and currently smoke or have quit within the past 15 years. Fecal occult blood test (FOBT) of the stool. You may have this test every year starting at age 19. Flexible sigmoidoscopy or colonoscopy. You may have a sigmoidoscopy every 5 years or a colonoscopy every 10 years starting at age 55. Prostate cancer screening. Recommendations will vary depending on your family history and other risks. Hepatitis C blood test. Hepatitis B blood test. Sexually transmitted disease (STD) testing. Diabetes screening. This is done by checking your blood sugar (glucose) after you have not eaten for a while (fasting). You may have this done every 1-3 years. Abdominal aortic aneurysm (AAA) screening. You may need this if you are a current or former smoker. Osteoporosis. You may be screened starting at age 26 if you are at high risk. Talk with your health care provider about your test results, treatment options, and if necessary, the need for more tests. Vaccines  Your health care provider may recommend certain vaccines, such as: Influenza vaccine. This is recommended every year. Tetanus, diphtheria, and acellular pertussis (Tdap, Td) vaccine. You may need a Td booster every 10 years. Zoster vaccine. You may need this after age 36. Pneumococcal 13-valent conjugate (PCV13) vaccine. One dose is recommended  after age 21. Pneumococcal  polysaccharide (PPSV23) vaccine. One dose is recommended after age 42. Talk to your health care provider about which screenings and vaccines you need and how often you need them. This information is not intended to replace advice given to you by your health care provider. Make sure you discuss any questions you have with your health care provider. Document Released: 03/16/2015 Document Revised: 11/07/2015 Document Reviewed: 12/19/2014 Elsevier Interactive Patient Education  2017 [place] Prevention in the Home Falls can cause injuries. They can happen to people of all ages. There are many things you can do to make your home safe and to help prevent falls. What can I do on the outside of my home? Regularly fix the edges of walkways and driveways and fix any cracks. Remove anything that might make you trip as you walk through a door, such as a raised step or threshold. Trim any bushes or trees on the path to your home. Use bright outdoor lighting. Clear any walking paths of anything that might make someone trip, such as rocks or tools. Regularly check to see if handrails are loose or broken. Make sure that both sides of any steps have handrails. Any raised decks and porches should have guardrails on the edges. Have any leaves, snow, or ice cleared regularly. Use sand or salt on walking paths during winter. Clean up any spills in your garage right away. This includes oil or grease spills. What can I do in the bathroom? Use night lights. Install grab bars by the toilet and in the tub and shower. Do not use towel bars as grab bars. Use non-skid mats or decals in the tub or shower. If you need to sit down in the shower, use a plastic, non-slip stool. Keep the floor dry. Clean up any water that spills on the floor as soon as it happens. Remove soap buildup in the tub or shower regularly. Attach bath mats securely with double-sided non-slip rug tape. Do not have throw rugs and other  things on the floor that can make you trip. What can I do in the bedroom? Use night lights. Make sure that you have a light by your bed that is easy to reach. Do not use any sheets or blankets that are too big for your bed. They should not hang down onto the floor. Have a firm chair that has side arms. You can use this for support while you get dressed. Do not have throw rugs and other things on the floor that can make you trip. What can I do in the kitchen? Clean up any spills right away. Avoid walking on wet floors. Keep items that you use a lot in easy-to-reach places. If you need to reach something above you, use a strong step stool that has a grab bar. Keep electrical cords out of the way. Do not use floor polish or wax that makes floors slippery. If you must use wax, use non-skid floor wax. Do not have throw rugs and other things on the floor that can make you trip. What can I do with my stairs? Do not leave any items on the stairs. Make sure that there are handrails on both sides of the stairs and use them. Fix handrails that are broken or loose. Make sure that handrails are as long as the stairways. Check any carpeting to make sure that it is firmly attached to the stairs. Fix any carpet that is loose or worn. Avoid having throw  rugs at the top or bottom of the stairs. If you do have throw rugs, attach them to the floor with carpet tape. Make sure that you have a light switch at the top of the stairs and the bottom of the stairs. If you do not have them, ask someone to add them for you. What else can I do to help prevent falls? Wear shoes that: Do not have high heels. Have rubber bottoms. Are comfortable and fit you well. Are closed at the toe. Do not wear sandals. If you use a stepladder: Make sure that it is fully opened. Do not climb a closed stepladder. Make sure that both sides of the stepladder are locked into place. Ask someone to hold it for you, if possible. Clearly  mark and make sure that you can see: Any grab bars or handrails. First and last steps. Where the edge of each step is. Use tools that help you move around (mobility aids) if they are needed. These include: Canes. Walkers. Scooters. Crutches. Turn on the lights when you go into a dark area. Replace any light bulbs as soon as they burn out. Set up your furniture so you have a clear path. Avoid moving your furniture around. If any of your floors are uneven, fix them. If there are any pets around you, be aware of where they are. Review your medicines with your doctor. Some medicines can make you feel dizzy. This can increase your chance of falling. Ask your doctor what other things that you can do to help prevent falls. This information is not intended to replace advice given to you by your health care provider. Make sure you discuss any questions you have with your health care provider. Document Released: 12/14/2008 Document Revised: 07/26/2015 Document Reviewed: 03/24/2014 Elsevier Interactive Patient Education  2017 [employer].

## 2020-11-20 NOTE — Progress Notes (Signed)
I connected with Erik Palmer today by telephone and verified that I am speaking with the correct person using two identifiers. Location patient: home Location provider: work Persons participating in the virtual visit: Erik Palmer, Elisha Ponder LPN.   I discussed the limitations, risks, security and privacy concerns of performing an evaluation and management service by telephone and the availability of in person appointments. I also discussed with the patient that there may be a patient responsible charge related to this service. The patient expressed understanding and verbally consented to this telephonic visit.    Interactive audio and video telecommunications were attempted between this provider and patient, however failed, due to patient having technical difficulties OR patient did not have access to video capability.  We continued and completed visit with audio only.     Vital signs may be patient reported or missing.  Subjective:   Erik Palmer is a 86 y.o. male who presents for Medicare Annual/Subsequent preventive examination.  Review of Systems     Cardiac Risk Factors include: advanced age (>42men, >10 women);dyslipidemia;hypertension;male gender;obesity (BMI >30kg/m2);sedentary lifestyle     Objective:    Today's Vitals   11/20/20 0816  Weight: 220 lb (99.8 kg)  Height: 5\' 9"  (1.753 m)   Body mass index is 32.49 kg/m.  Advanced Directives 11/20/2020 07/21/2017 03/26/2017 12/27/2015 12/27/2015 12/27/2015 12/05/2015  Does Patient Have a Medical Advance Directive? Yes No No No No No No  Type of 02/04/2016 of [place];Living will - - - - - -  Copy of Healthcare Power of Attorney in Chart? No - copy requested - - - - - -  Would patient like information on creating a medical advance directive? - No - Patient declined - No - patient declined information No - patient declined information No - patient declined information -  Some encounter  information is confidential and restricted. Go to Review Flowsheets activity to see all data.    Current Medications (verified) Outpatient Encounter Medications as of 11/20/2020  Medication Sig   amLODipine (NORVASC) 5 MG tablet Take 1 tablet (5 mg total) by mouth daily.   apixaban (ELIQUIS) 5 MG TABS tablet Take 5 mg by mouth 2 (two) times daily.   atorvastatin (LIPITOR) 40 MG tablet Take 1 tablet (40 mg total) by mouth daily.   carvedilol (COREG) 6.25 MG tablet Take 6.25 mg by mouth 2 (two) times daily with a meal.   doxazosin (CARDURA) 2 MG tablet Take 1 tablet (2 mg total) by mouth 2 (two) times daily.   fluticasone (FLONASE) 50 MCG/ACT nasal spray SPRAY 2 SPRAYS INTO EACH NOSTRIL EVERY DAY   lisinopril (ZESTRIL) 5 MG tablet Take 2.5 mg by mouth daily.   omeprazole (PRILOSEC) 20 MG capsule TAKE 1 CAPSULE BY MOUTH TWICE A DAY   oxybutynin (DITROPAN-XL) 5 MG 24 hr tablet Take 5 mg by mouth at bedtime.   QUEtiapine (SEROQUEL) 50 MG tablet Take 1 tablet (50 mg total) by mouth at bedtime.   acetaminophen (TYLENOL) 650 MG CR tablet Take 650 mg by mouth 2 (two) times daily. Reported on 05/08/2015 (Patient not taking: Reported on 11/20/2020)   aspirin EC 81 MG tablet Take 81 mg by mouth daily. (Patient not taking: Reported on 11/20/2020)   cephALEXin (KEFLEX) 500 MG capsule Take 1 capsule (500 mg total) by mouth 3 (three) times daily. For 7 days (Patient not taking: Reported on 11/20/2020)   cholecalciferol (VITAMIN D) 400 units TABS tablet Take 400 Units by mouth daily. (Patient  not taking: No sig reported)   COVID-19 mRNA vaccine, Pfizer, 30 MCG/0.3ML injection USE AS DIRECTED   Docusate Sodium (DSS) 100 MG CAPS Take 100 mg by mouth. (Patient not taking: Reported on 11/20/2020)   polyethylene glycol powder (GLYCOLAX/MIRALAX) powder Take 17 g by mouth daily. (Patient not taking: Reported on 11/20/2020)   No facility-administered encounter medications on file as of 11/20/2020.    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   TOTAL HIP ARTHROPLASTY Right 12/05/2015   Procedure: TOTAL HIP ARTHROPLASTY ANTERIOR APPROACH;  Surgeon: Kennedy Bucker, MD;  Location: [facility] ORS;  Service: Orthopedics;  Laterality: Right;   Family History  Problem Relation Age of Onset   Hypertension Mother    Heart block Mother    Stroke Mother    Depression Mother    Cancer Father    Heart attack Sister    COPD Sister    Hypertension Brother    Hypertension Brother    Hypertension Brother    Hypertension Brother    Heart attack Brother    Diabetes Neg Hx    Social History   Socioeconomic History   Marital status: Married    Spouse name: Not on file   Number of children: Not on file   Years of education: Not on file   Highest education level: Not on file  Occupational History   Occupation: retired  Tobacco Use   Smoking status: Never   Smokeless tobacco: Never  Vaping Use   Vaping Use: Never used  Substance and Sexual Activity   Alcohol use: No    Alcohol/week: 0.0 standard drinks   Drug use: No   Sexual activity: Not Currently  Other Topics Concern   Not on file  Social History Narrative   Not on file   Social Determinants of Health   Financial Resource Strain: Low Risk    Difficulty of Paying Living Expenses: Not hard at all  Food Insecurity: No Food Insecurity   Worried About Programme researcher, broadcasting/film/video in the Last Year: Never true   Ran Out of Food in the Last Year: Never true  Transportation Needs: No Transportation Needs   Lack of  Transportation (Medical): No   Lack of Transportation (Non-Medical): No  Physical Activity: Inactive   Days of Exercise per Week: 0 days   Minutes of Exercise per Session: 0 min  Stress: No Stress Concern Present   Feeling of Stress : Not at all  Social Connections: Not on file    Tobacco Counseling Counseling given: Not Answered   Clinical Intake:  Pre-visit preparation completed: Yes  Pain : No/denies pain     Nutritional Status: BMI > 30  Obese Nutritional Risks: None Diabetes: No  How often do you need to have someone help you when you read instructions, pamphlets, or other written materials from your doctor or pharmacy?: 1 - Never What is the last grade level you completed in school?: 12th grade  Diabetic? no  Interpreter Needed?: No  Information entered by :: NAllen LPN  Activities of Daily Living In your present state of health, do you have any difficulty performing the following activities: 11/20/2020  Hearing? N  Vision? Y  Comment sometimes  Difficulty concentrating or making decisions? N  Walking or climbing stairs? Y  Dressing or bathing? N  Doing errands, shopping? N  Preparing Food and eating ? N  Using the Toilet? N  In the past six months, have you accidently leaked urine? N  Do you have problems with loss of bowel control? N  Managing your Medications? N  Managing your Finances? N  Housekeeping or managing your Housekeeping? N  Some recent data might be hidden    Patient Care Team: Smitty Cords, DO as PCP - General (Family Medicine)  Indicate any recent Medical Services you may have received from other than [facility] providers in the past year (date may be approximate).     Assessment:   This is a routine wellness examination for [place].  Hearing/Vision screen Vision Screening - Comments:: Regular eye exams, [facility]  Dietary issues and exercise activities discussed: Current Exercise Habits: The patient does not  participate in regular exercise at present   Goals Addressed             This Visit's Progress    Patient Stated       11/20/2020, no goals       Depression Screen PHQ 2/9 Scores 11/20/2020 08/03/2020 01/19/2020 07/19/2019 01/17/2019 11/16/2018 07/21/2017  PHQ - 2 Score 0 0 0 0 0 0 0  PHQ- 9 Score - 0 1 1 2  - 0    Fall Risk Fall Risk  11/20/2020 08/03/2020 07/19/2019 01/17/2019 11/16/2018  Falls in the past year? 0 0 0 0 0  Number falls in past yr: - 0 0 0 -  Injury with Fall? - 0 0 - -  Risk for fall due to : Medication side effect - - - -  Follow up Falls evaluation completed;Education provided;Falls prevention discussed Falls evaluation completed Falls evaluation completed Falls evaluation completed -    FALL RISK PREVENTION PERTAINING TO THE HOME:  Any stairs in or around the home? No  If so, are there any without handrails? N/a Home free of loose throw rugs in walkways, pet beds, electrical cords, etc? Yes  Adequate lighting in your home to reduce risk of falls? Yes   ASSISTIVE DEVICES UTILIZED TO PREVENT FALLS:  Life alert? No  Use of a cane, walker or w/c? No  Grab bars in the bathroom? Yes  Shower chair or bench in shower? Yes  Elevated toilet seat or a handicapped toilet? Yes   TIMED UP AND GO:  Was the test performed? No .      Cognitive Function: MMSE - Mini Mental State Exam 12/22/2014  Orientation to time 5  Orientation to Place 5  Registration 3  Attention/ Calculation 5  Recall 3  Language- name 2 objects 2  Language- repeat 1  Language- follow 3 step command 3  Language- read & follow direction 1  Write a sentence 1  Copy design 1  Total score 30     6CIT Screen 11/20/2020 07/21/2017  What Year? 0 points 0 points  What month? 0 points 0 points  What time? 0 points 0 points  Count back from 20 0 points 0 points  Months in reverse 0 points 0 points  Repeat phrase 4 points 0 points  Total Score 4 0    Immunizations Immunization History   Administered  Date(s) Administered   PFIZER(Purple Top)SARS-COV-2 Vaccination 04/29/2019, 05/31/2019, 02/07/2020   Pneumococcal Conjugate-13 06/20/2014   Tdap 06/20/2014    TDAP status: Up to date  Flu Vaccine status: Declined, Education has been provided regarding the importance of this vaccine but patient still declined. Advised may receive this vaccine at local pharmacy or Health Dept. Aware to provide a copy of the vaccination record if obtained from local pharmacy or Health Dept. Verbalized acceptance and understanding.  Pneumococcal vaccine status: Up to date  Covid-19 vaccine status: Completed vaccines  Qualifies for Shingles Vaccine? Yes   Zostavax completed No   Shingrix Completed?: No.    Education has been provided regarding the importance of this vaccine. Patient has been advised to call insurance company to determine out of pocket expense if they have not yet received this vaccine. Advised may also receive vaccine at local pharmacy or Health Dept. Verbalized acceptance and understanding.  Screening Tests Health Maintenance  Topic Date Due   Zoster Vaccines- Shingrix (1 of 2) Never done   INFLUENZA VACCINE  05/31/2021 (Originally 10/01/2020)   TETANUS/TDAP  06/19/2024   COVID-19 Vaccine  Completed   HPV VACCINES  Aged Out    Health Maintenance  Health Maintenance Due  Topic Date Due   Zoster Vaccines- Shingrix (1 of 2) Never done    Colorectal cancer screening: No longer required.   Lung Cancer Screening: (Low Dose CT Chest recommended if Age 77-80 years, 30 pack-year currently smoking OR have quit w/in 15years.) does not qualify.   Lung Cancer Screening Referral: no  Additional Screening:  Hepatitis C Screening: does not qualify;   Vision Screening: Recommended annual ophthalmology exams for early detection of glaucoma and other disorders of the eye. Is the patient up to date with their annual eye exam?  Yes  Who is the provider or what is the name of the  office in which the patient attends annual eye exams? [facility] If pt is not established with a provider, would they like to be referred to a provider to establish care? No .   Dental Screening: Recommended annual dental exams for proper oral hygiene  Community Resource Referral / Chronic Care Management: CRR required this visit?  No   CCM required this visit?  No      Plan:     I have personally reviewed and noted the following in the patient's chart:   Medical and social history Use of alcohol, tobacco or illicit drugs  Current medications and supplements including opioid prescriptions. Patient is not currently taking opioid prescriptions. Functional ability and status Nutritional status Physical activity Advanced directives List of other physicians Hospitalizations, surgeries, and ER visits in previous 12 months Vitals Screenings to include cognitive, depression, and falls Referrals and appointments  In addition, I have reviewed and discussed with patient certain preventive protocols, quality metrics, and best practice recommendations. A written personalized care plan for preventive services as well as general preventive health recommendations were provided to patient.     Barb Merino, LPN   2/62/0355   Nurse Notes:

## 2020-11-30 ENCOUNTER — Other Ambulatory Visit: Payer: Self-pay | Admitting: Family Medicine

## 2020-11-30 DIAGNOSIS — N138 Other obstructive and reflux uropathy: Secondary | ICD-10-CM

## 2020-11-30 NOTE — Telephone Encounter (Signed)
Requested medication (s) are due for refill today:   Not sure  Requested medication (s) are on the active medication list:   Yes but no information  Future visit scheduled:   Yes with [place]   Last ordered: 03/22/2019 by a historical provider  Returned because last prescribed by a historical provider   Requested Prescriptions  Pending Prescriptions Disp Refills   oxybutynin (DITROPAN-XL) 5 MG 24 hr tablet      Sig: Take 1 tablet (5 mg total) by mouth at bedtime.     Urology:  Bladder Agents Passed - 11/30/2020  1:52 PM      Passed - Valid encounter within last 12 months    Recent Outpatient Visits           3 months ago Acute cystitis with hematuria   [facility], Netta Neat, DO   10 months ago Annual physical exam   [facility] Smitty Cords, DO   1 year ago Screening for prostate cancer   [facility] Smitty Cords, DO   1 year ago Annual physical exam   [facility] Smitty Cords, DO   2 years ago BPH with obstruction/lower urinary tract symptoms   [facility] Althea Charon, Netta Neat, DO       Future Appointments             In 2 months Althea Charon, Netta Neat, DO [facility], [facility]   In 12 months  [facility], [place]

## 2020-11-30 NOTE — Telephone Encounter (Signed)
Copied from CRM [id number]. Topic: Quick Communication - Rx Refill/Question >> Nov 30, 2020 12:26 PM Izora Ribas, Everette A wrote: Medication: oxybutynin (DITROPAN-XL) 5 MG 24 hr tablet [[id number]]   Has the patient contacted their pharmacy? No. (Agent: If no, request that the patient contact the pharmacy for the refill.) (Agent: If yes, when and what did the pharmacy advise?)  Preferred Pharmacy (with phone number or street name): [id number] [place], [place] - [address]  Phone:  [phone] Fax:  [phone]  Has the patient been seen for an appointment in the last year OR does the patient have an upcoming appointment? Yes.    Agent: Please be advised that RX refills may take up to 3 business days. We ask that you follow-up with your pharmacy.

## 2020-12-03 MED ORDER — OXYBUTYNIN CHLORIDE ER 5 MG PO TB24: 5.0000 mg | ORAL_TABLET | Freq: Every day | ORAL | 1 refills | Status: DC

## 2021-01-11 ENCOUNTER — Other Ambulatory Visit: Payer: Self-pay | Admitting: Family Medicine

## 2021-01-11 DIAGNOSIS — I10 Essential (primary) hypertension: Secondary | ICD-10-CM

## 2021-01-11 DIAGNOSIS — K219 Gastro-esophageal reflux disease without esophagitis: Secondary | ICD-10-CM

## 2021-01-11 DIAGNOSIS — F5104 Psychophysiologic insomnia: Secondary | ICD-10-CM

## 2021-01-11 MED ORDER — AMLODIPINE BESYLATE 5 MG PO TABS: 5.0000 mg | ORAL_TABLET | Freq: Every day | ORAL | 0 refills | Status: DC

## 2021-01-11 NOTE — Telephone Encounter (Signed)
Requested medications are due for refill today yes  Requested medications are on the active medication list yes  Last visit 08/03/20  Future visit scheduled 02/07/21  Notes to clinic This medication can not be delegated, please assess.   Requested Prescriptions  Pending Prescriptions Disp Refills   QUEtiapine (SEROQUEL) 50 MG tablet 90 tablet 3    Sig: Take 1 tablet (50 mg total) by mouth at bedtime.     Not Delegated - Psychiatry:  Antipsychotics - Second Generation (Atypical) - quetiapine Failed - 01/11/2021  1:50 PM      Failed - This refill cannot be delegated      Failed - ALT in normal range and within 180 days    ALT  Date Value Ref Range Status  01/12/2020 12 9 - 46 U/L Final   SGPT (ALT)  Date Value Ref Range Status  11/02/2012 24 12 - 78 U/L Final          Failed - AST in normal range and within 180 days    AST  Date Value Ref Range Status  01/12/2020 14 10 - 35 U/L Final   SGOT(AST)  Date Value Ref Range Status  11/02/2012 26 15 - 37 Unit/L Final          Failed - Last BP in normal range    BP Readings from Last 1 Encounters:  08/03/20 (!) 148/74          Passed - Completed PHQ-2 or PHQ-9 in the last 360 days      Passed - Valid encounter within last 6 months    Recent Outpatient Visits           5 months ago Acute cystitis with hematuria   [facility] Smitty Cords, DO   11 months ago Annual physical exam   [facility] Smitty Cords, DO   1 year ago Screening for prostate cancer   [facility] Smitty Cords, DO   1 year ago Annual physical exam   [facility] Smitty Cords, DO   2 years ago BPH with obstruction/lower urinary tract symptoms   [facility], Netta Neat, DO       Future Appointments             In 3 weeks Althea Charon, Netta Neat, DO [facility], [facility]   In 10 months  [facility]

## 2021-01-11 NOTE — Telephone Encounter (Signed)
Copied from CRM [id number]. Topic: Quick Communication - Rx Refill/Question >> Jan 11, 2021 10:27 AM Marylen Ponto wrote: Medication: QUEtiapine (SEROQUEL) 50 MG tablet  Has the patient contacted their pharmacy? Yes.  Pt has new pharmacy (Agent: If no, request that the patient contact the pharmacy for the refill. If patient does not wish to contact the pharmacy document the reason why and proceed with request.) (Agent: If yes, when and what did the pharmacy advise?)  Preferred Pharmacy (with phone number or street name): [id number] [place], [place] - [address] Phone: [phone]   Fax: [phone]  Has the patient been seen for an appointment in the last year OR does the patient have an upcoming appointment? Yes.    Agent: Please be advised that RX refills may take up to 3 business days. We ask that you follow-up with your pharmacy.

## 2021-01-11 NOTE — Telephone Encounter (Signed)
Requested Prescriptions  Pending Prescriptions Disp Refills  . amLODipine (NORVASC) 5 MG tablet 90 tablet 0    Sig: Take 1 tablet (5 mg total) by mouth daily.     Cardiovascular:  Calcium Channel Blockers Failed - 01/11/2021  1:38 PM      Failed - Last BP in normal range    BP Readings from Last 1 Encounters:  08/03/20 (!) 148/74         Passed - Valid encounter within last 6 months    Recent Outpatient Visits          5 months ago Acute cystitis with hematuria   [facility] Smitty Cords, DO   11 months ago Annual physical exam   [facility] Smitty Cords, DO   1 year ago Screening for prostate cancer   [facility] Smitty Cords, DO   1 year ago Annual physical exam   [facility] Smitty Cords, DO   2 years ago BPH with obstruction/lower urinary tract symptoms   [facility] Althea Charon, Netta Neat, DO      Future Appointments            In 3 weeks Althea Charon, Netta Neat, DO [facility], [facility]   In 10 months  [facility], [place]           . omeprazole (PRILOSEC) 20 MG capsule 180 capsule 2    Sig: Take 1 capsule (20 mg total) by mouth 2 (two) times daily.     Gastroenterology: Proton Pump Inhibitors Passed - 01/11/2021  1:38 PM      Passed - Valid encounter within last 12 months    Recent Outpatient Visits          5 months ago Acute cystitis with hematuria   [facility] Smitty Cords, DO   11 months ago Annual physical exam   [facility] Smitty Cords, DO   1 year ago Screening for prostate cancer   [facility] Smitty Cords, DO   1 year ago Annual physical exam   [facility] Smitty Cords, DO   2 years ago BPH with obstruction/lower urinary tract symptoms   [facility] Althea Charon,  Netta Neat, DO      Future Appointments            In 3 weeks Althea Charon, Netta Neat, DO [facility], [facility]   In 10 months  [facility], [place]

## 2021-01-11 NOTE — Telephone Encounter (Unsigned)
Copied from CRM [id number]. Topic: Quick Communication - Rx Refill/Question >> Jan 11, 2021 10:16 AM Marylen Ponto wrote: Medication: amLODipine (NORVASC) 5 MG tablet and omeprazole (PRILOSEC) 20 MG capsule  Has the patient contacted their pharmacy? Yes.  Pt old pharmacy no longer open so a new Rx need to be sent  (Agent: If no, request that the patient contact the pharmacy for the refill. If patient does not wish to contact the pharmacy document the reason why and proceed with request.) (Agent: If yes, when and what did the pharmacy advise?)  Preferred Pharmacy (with phone number or street name): [id number] [place], [place] - [address] Phone: [phone]  Fax: [phone]  Has the patient been seen for an appointment in the last year OR does the patient have an upcoming appointment? Yes.    Agent: Please be advised that RX refills may take up to 3 business days. We ask that you follow-up with your pharmacy.

## 2021-01-12 MED ORDER — OMEPRAZOLE 20 MG PO CPDR: 20.0000 mg | DELAYED_RELEASE_CAPSULE | Freq: Two times a day (BID) | ORAL | 2 refills | Status: DC

## 2021-01-14 MED ORDER — QUETIAPINE FUMARATE 50 MG PO TABS: 50.0000 mg | ORAL_TABLET | Freq: Every day | ORAL | 3 refills | Status: DC

## 2021-02-07 ENCOUNTER — Ambulatory Visit (INDEPENDENT_AMBULATORY_CARE_PROVIDER_SITE_OTHER): Payer: Medicare Other | Admitting: Family Medicine

## 2021-02-07 ENCOUNTER — Other Ambulatory Visit: Payer: Self-pay

## 2021-02-07 ENCOUNTER — Encounter: Payer: Self-pay | Admitting: Family Medicine

## 2021-02-07 VITALS — BP 142/60 | HR 60 | Ht 69.0 in | Wt 228.4 lb

## 2021-02-07 DIAGNOSIS — E669 Obesity, unspecified: Secondary | ICD-10-CM

## 2021-02-07 DIAGNOSIS — N138 Other obstructive and reflux uropathy: Secondary | ICD-10-CM

## 2021-02-07 DIAGNOSIS — N401 Enlarged prostate with lower urinary tract symptoms: Secondary | ICD-10-CM

## 2021-02-07 DIAGNOSIS — F3342 Major depressive disorder, recurrent, in full remission: Secondary | ICD-10-CM

## 2021-02-07 DIAGNOSIS — R7309 Other abnormal glucose: Secondary | ICD-10-CM | POA: Diagnosis not present

## 2021-02-07 DIAGNOSIS — I4821 Permanent atrial fibrillation: Secondary | ICD-10-CM

## 2021-02-07 DIAGNOSIS — E782 Mixed hyperlipidemia: Secondary | ICD-10-CM | POA: Diagnosis not present

## 2021-02-07 DIAGNOSIS — I10 Essential (primary) hypertension: Secondary | ICD-10-CM

## 2021-02-07 MED ORDER — DOXAZOSIN MESYLATE 2 MG PO TABS: 2.0000 mg | ORAL_TABLET | Freq: Two times a day (BID) | ORAL | 3 refills | Status: DC

## 2021-02-07 MED ORDER — APIXABAN 5 MG PO TABS: 5.0000 mg | ORAL_TABLET | Freq: Two times a day (BID) | ORAL | 11 refills | Status: AC

## 2021-02-07 MED ORDER — ATORVASTATIN CALCIUM 40 MG PO TABS: 40.0000 mg | ORAL_TABLET | Freq: Every day | ORAL | 3 refills | Status: DC

## 2021-02-07 MED ORDER — AMLODIPINE BESYLATE 5 MG PO TABS
5.0000 mg | ORAL_TABLET | Freq: Every day | ORAL | 0 refills | Status: DC
Start: 2021-02-07 — End: 2021-06-27

## 2021-02-07 MED ORDER — MYRBETRIQ 25 MG PO TB24: 25.0000 mg | ORAL_TABLET | Freq: Every day | ORAL | 3 refills | Status: DC

## 2021-02-07 NOTE — Patient Instructions (Addendum)
Thank you for coming to the office today.  Sent all rx medications.  New rx Myrbetriq 25mg  daily, 90 day with refills. If cannot get it or too high cost, may need to change insurance plan for diff rx coverage, or let me know we can try other options to get the covered.  Stop oxybutynin if you start the Myrbetriq.  Lab draw today.   Please schedule a Follow-up Appointment to: Return in about 6 months (around 08/08/2021) for 6 month follow-up HTN, BPH urinary symptoms.  If you have any other questions or concerns, please feel free to call the office or send a message through MyChart. You may also schedule an earlier appointment if necessary.  Additionally, you may be receiving a survey about your experience at our office within a few days to 1 week by e-mail or mail. We value your feedback.  10/08/2021, DO [facility], [facility]

## 2021-02-07 NOTE — Progress Notes (Signed)
Subjective:    Patient ID: Erik Palmer, male    DOB: 1934/08/07, 86 y.o.   MRN: [id number]  Erik Palmer is a 86 y.o. male presenting on 02/07/2021 for Annual Exam   HPI  CHRONIC HTN: Reports elevated BP here, and home readings avg 130/70s Current Meds - Amlodipine 5mg  daily, Carvedilol 6.25mg  BID, Doxazosin 2mg  BID, Lisinopril 2.5mg  daily   Reports good compliance, took meds today. Tolerating well, w/o complaints. Denies CP, dyspnea, HA, edema, dizziness / lightheadedness  Elevated A1c A1c average 5.6 to 5.7, last lab 5.7 stable without new concerns. Due A1c lab. CBGs: not checking CBG regularly Meds: never on med Currently not on ACEi / ARB Lifestyle: - Diet (improved diet)  - Exercise (limited but he is improving exercise) Denies hypoglycemia   PMH - Cardiology / Permanent Atrial Fibrillation / HTN    BPH with LUTS Gross Hematuria UTI Admits dark urine only after soda. Otherwise not a problem.  Previously seen by [place], and they did prostate evaluation and DRE showed normal exam by report, he was treated for UTI. Improved on Doxazosin currently but still complaints of significant LUTS. He has had issues with bladder spasms and urinary frequency but reduced volume and LUTS Obstructive symptoms - Previously last seen 01/2019 by me we tried to order Myrbetriq but cost too high, Detrol LA was not covered or preferred. He was initiated on oxybutynin XL 5mg  nightly, he has done fairly well but it has not resolved his nocturia and urinary LUTS.  Failed Oxybutynin - now ready to trial Myrbetriq again.     Major Depression recurrent mild vs remission / Insomnia Taking Seroquel 50mg  nightly currently and doing well     Health Maintenance:   Elevated PSA / Prostate CA Screening: PSA trend 2.3 > to 2.7 (07/2017 - at [facility]) now recent trend 3.6 (01/2019) to 3.2 (06/2019) now last updated PSA 01/2020 was PSA 4.12 - Due for PSA today.   Depression screen [facility] 2/9  11/20/2020 08/03/2020 01/19/2020  Decreased Interest 0 0 0  Down, Depressed, Hopeless 0 0 0  PHQ - 2 Score 0 0 0  Altered sleeping - 0 1  Tired, decreased energy - 0 0  Change in appetite - 0 0  Feeling bad or failure about yourself  - 0 0  Trouble concentrating - 0 0  Moving slowly or fidgety/restless - 0 0  Suicidal thoughts - 0 0  PHQ-9 Score - 0 1  Difficult doing work/chores - Not difficult at all Not difficult at all  Some recent data might be hidden    Past Medical History:  Diagnosis Date   Angina pectoris syndrome ([place])    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   Arthritis    GERD (gastroesophageal reflux disease)    Grief at loss of child 09/03/2014   Hypercholesteremia    Hypertension    Prostate enlargement    Past Surgical History:  Procedure Laterality Date   CORONARY ARTERY BYPASS GRAFT  03/25/2017   at [place] Right 12/05/2015   Procedure: TOTAL HIP ARTHROPLASTY ANTERIOR APPROACH;  Surgeon: Hessie Knows, MD;  Location: [facility] ORS;  Service: Orthopedics;  Laterality: Right;   Social History   Socioeconomic History   Marital status: Married    Spouse name: Not on file   Number of children: Not on file   Years of education: Not on file   Highest education level: Not on file  Occupational History   Occupation: retired  Tobacco Use   Smoking status: Never   Smokeless tobacco: Never  Vaping Use   Vaping Use: Never used  Substance and Sexual Activity   Alcohol use: No    Alcohol/week: 0.0 standard drinks   Drug use: No   Sexual activity: Not Currently  Other Topics Concern   Not on file  Social History Narrative   Not on file   Social Determinants of Health   Financial Resource Strain: Low Risk    Difficulty of Paying Living Expenses: Not hard at all  Food Insecurity: No Food Insecurity   Worried About Charity fundraiser in the Last Year: Never true   [place] in the Last Year: Never true   Transportation Needs: No Transportation Needs   Lack of Transportation (Medical): No   Lack of Transportation (Non-Medical): No  Physical Activity: Inactive   Days of Exercise per Week: 0 days   Minutes of Exercise per Session: 0 min  Stress: No Stress Concern Present   Feeling of Stress : Not at all  Social Connections: Not on file  Intimate Partner Violence: Not on file   Family History  Problem Relation Age of Onset   Hypertension Mother    Heart block Mother    Stroke Mother    Depression Mother    Cancer Father    Heart attack Sister    COPD Sister    Hypertension Brother    Hypertension Brother    Hypertension Brother    Hypertension Brother    Heart attack Brother    Diabetes Neg Hx    Current Outpatient Medications on File Prior to Visit  Medication Sig   carvedilol (COREG) 6.25 MG tablet Take 6.25 mg by mouth 2 (two) times daily with a meal.   fluticasone (FLONASE) 50 MCG/ACT nasal spray SPRAY 2 SPRAYS INTO EACH NOSTRIL EVERY DAY   lisinopril (ZESTRIL) 5 MG tablet Take 2.5 mg by mouth daily.   omeprazole (PRILOSEC) 20 MG capsule Take 1 capsule (20 mg total) by mouth 2 (two) times daily.   QUEtiapine (SEROQUEL) 50 MG tablet Take 1 tablet (50 mg total) by mouth at bedtime.   acetaminophen (TYLENOL) 650 MG CR tablet Take 650 mg by mouth 2 (two) times daily. Reported on 05/08/2015 (Patient not taking: Reported on 11/20/2020)   aspirin EC 81 MG tablet Take 81 mg by mouth daily. (Patient not taking: Reported on 11/20/2020)   cholecalciferol (VITAMIN D) 400 units TABS tablet Take 400 Units by mouth daily. (Patient not taking: Reported on 01/19/2020)   Docusate Sodium (DSS) 100 MG CAPS Take 100 mg by mouth. (Patient not taking: Reported on 11/20/2020)   polyethylene glycol powder (GLYCOLAX/MIRALAX) powder Take 17 g by mouth daily. (Patient not taking: Reported on 11/20/2020)   No current facility-administered medications on file prior to visit.    Review of Systems Per HPI  unless specifically indicated above      Objective:    BP (!) 142/60 (BP Location: Left Arm, Patient Position: Sitting, Cuff Size: Normal)   Pulse 60   Ht 5\' 9"  (1.753 m)   Wt 228 lb 6.4 oz (103.6 kg)   SpO2 99%   BMI 33.73 kg/m   Wt Readings from Last 3 Encounters:  02/07/21 228 lb 6.4 oz (103.6 kg)  11/20/20 220 lb (99.8 kg)  08/03/20 236 lb 12.8 oz (107.4 kg)    Physical Exam Vitals and nursing note reviewed.  Constitutional:  General: He is not in acute distress.    Appearance: He is well-developed. He is not diaphoretic.     Comments: Well-appearing, comfortable, cooperative  HENT:     Head: Normocephalic and atraumatic.  Eyes:     General:        Right eye: No discharge.        Left eye: No discharge.     Conjunctiva/sclera: Conjunctivae normal.  Neck:     Thyroid: No thyromegaly.  Cardiovascular:     Rate and Rhythm: Normal rate. Rhythm irregular.     Pulses: Normal pulses.     Heart sounds: Normal heart sounds. No murmur heard. Pulmonary:     Effort: Pulmonary effort is normal. No respiratory distress.     Breath sounds: Normal breath sounds. No wheezing or rales.  Musculoskeletal:        General: Normal range of motion.     Cervical back: Normal range of motion and neck supple.  Lymphadenopathy:     Cervical: No cervical adenopathy.  Skin:    General: Skin is warm and dry.     Findings: No erythema or rash.  Neurological:     Mental Status: He is alert and oriented to person, place, and time. Mental status is at baseline.  Psychiatric:        Behavior: Behavior normal.     Comments: Well groomed, good eye contact, normal speech and thoughts   Results for orders placed or performed in visit on 08/03/20  Urine Culture   Specimen: Urine  Result Value Ref Range   MICRO NUMBER: [id number]    SPECIMEN QUALITY: Adequate    Sample Source URINE    STATUS: FINAL    ISOLATE 1: Escherichia coli (A)       Susceptibility   Escherichia coli - URINE CULTURE,  REFLEX    AMOX/CLAVULANIC 8 Sensitive     AMPICILLIN 8 Sensitive     AMPICILLIN/SULBACTAM <=2 Sensitive     CEFAZOLIN* <=4 Not Reportable      * For infections other than uncomplicated UTIcaused by E. coli, K. pneumoniae or P. mirabilis:Cefazolin is resistant if MIC > or = 8 mcg/mL.(Distinguishing susceptible versus intermediatefor isolates with MIC < or = 4 mcg/mL requiresadditional testing.)For uncomplicated UTI caused by E. coli,K. pneumoniae or P. mirabilis: Cefazolin issusceptible if MIC <32 mcg/mL and predictssusceptible to the oral agents cefaclor, cefdinir,cefpodoxime, cefprozil, cefuroxime, cephalexinand loracarbef.    CEFEPIME <=1 Sensitive     CEFTRIAXONE <=1 Sensitive     CIPROFLOXACIN <=0.25 Sensitive     LEVOFLOXACIN <=0.12 Sensitive     ERTAPENEM <=0.5 Sensitive     GENTAMICIN <=1 Sensitive     IMIPENEM 0.5 Sensitive     NITROFURANTOIN 64 Intermediate     PIP/TAZO <=4 Sensitive     TOBRAMYCIN <=1 Sensitive     TRIMETH/SULFA* <=20 Sensitive      * For infections other than uncomplicated UTIcaused by E. coli, K. pneumoniae or P. mirabilis:Cefazolin is resistant if MIC > or = 8 mcg/mL.(Distinguishing susceptible versus intermediatefor isolates with MIC < or = 4 mcg/mL requiresadditional testing.)For uncomplicated UTI caused by E. coli,K. pneumoniae or P. mirabilis: Cefazolin issusceptible if MIC <32 mcg/mL and predictssusceptible to the oral agents cefaclor, cefdinir,cefpodoxime, cefprozil, cefuroxime, cephalexinand loracarbef.Legend:S = Susceptible  I = IntermediateR = Resistant  NS = Not susceptible* = Not tested  NR = Not reported**NN = See antimicrobic comments  PSA  Result Value Ref Range   PSA 3.02 < OR = 4.00 ng/mL  Hemoglobin A1c  Result Value Ref Range   Hgb A1c MFr Bld 5.7 (H) <5.7 % of total Hgb   Mean Plasma Glucose 117 mg/dL   eAG (mmol/L) 6.5 mmol/L  BASIC METABOLIC PANEL WITH GFR  Result Value Ref Range   Glucose, Bld 91 65 - 99 mg/dL   BUN 12 7 - 25 mg/dL    Creat 7.42 5.95 - 6.38 mg/dL   GFR, Est Non African American 86 > OR = 60 mL/min/1.49m2   GFR, Est African American 99 > OR = 60 mL/min/1.31m2   BUN/Creatinine Ratio NOT APPLICABLE 6 - 22 (calc)   Sodium 140 135 - 146 mmol/L   Potassium 4.3 3.5 - 5.3 mmol/L   Chloride 105 98 - 110 mmol/L   CO2 26 20 - 32 mmol/L   Calcium 9.3 8.6 - 10.3 mg/dL  POCT Urinalysis Dipstick  Result Value Ref Range   Color, UA Yellow    Clarity, UA Cloudy    Glucose, UA Negative Negative   Bilirubin, UA Negative    Ketones, UA Negative    Spec Grav, UA 1.010 1.010 - 1.025   Blood, UA Large +++    pH, UA 7.0 5.0 - 8.0   Protein, UA Positive (A) Negative   Urobilinogen, UA 0.2 0.2 or 1.0 E.U./dL   Nitrite, UA Positive    Leukocytes, UA Moderate (2+) (A) Negative   Appearance     Odor        Assessment & Plan:   Problem List Items Addressed This Visit     Obesity (BMI 35.0-39.9 without comorbidity)   Relevant Orders   COMPLETE METABOLIC PANEL WITH GFR   Lipid panel   Major depression, recurrent, full remission (HCC)   Relevant Orders   COMPLETE METABOLIC PANEL WITH GFR   Hyperlipidemia   Relevant Medications   amLODipine (NORVASC) 5 MG tablet   doxazosin (CARDURA) 2 MG tablet   apixaban (ELIQUIS) 5 MG TABS tablet   atorvastatin (LIPITOR) 40 MG tablet   Other Relevant Orders   COMPLETE METABOLIC PANEL WITH GFR   Lipid panel   Essential hypertension   Relevant Medications   amLODipine (NORVASC) 5 MG tablet   doxazosin (CARDURA) 2 MG tablet   apixaban (ELIQUIS) 5 MG TABS tablet   atorvastatin (LIPITOR) 40 MG tablet   Other Relevant Orders   COMPLETE METABOLIC PANEL WITH GFR   CBC with Differential/Platelet   Elevated hemoglobin A1c   Relevant Orders   Hemoglobin A1c   BPH with obstruction/lower urinary tract symptoms - Primary   Relevant Medications   MYRBETRIQ 25 MG TB24 tablet   doxazosin (CARDURA) 2 MG tablet   Other Relevant Orders   PSA   Atrial fibrillation (HCC)   Relevant  Medications   amLODipine (NORVASC) 5 MG tablet   doxazosin (CARDURA) 2 MG tablet   apixaban (ELIQUIS) 5 MG TABS tablet   atorvastatin (LIPITOR) 40 MG tablet    AFib, chronic Followed by [facility] On Eliquis  Major Depression recurrent in remission Insomnia Currently controlled on medication On Seroquel  BPH LUTS Prior management on various agents in past, limited options due to cost and coverage Failed Oxybutynin Has had work up per Urology at [facility] re order Myrbetriq 25mg  daily trial as he requests, says wife cost on this med is only $80 for 3 months, he would like to try again. If still unsuccessful may benefit from [facility] pharmacy referral for med assistance. This is only other available option given he  has failed other anti spasmodic anticholinergic agents and ineffective or not ideal indication for him.  HTN Continues on current medication management  HLD Controlled on ATorvastatin 40mg  daily  A1c 5.7 stable.   Meds ordered this encounter  Medications   MYRBETRIQ 25 MG TB24 tablet    Sig: Take 1 tablet (25 mg total) by mouth daily.    Dispense:  90 tablet    Refill:  3   amLODipine (NORVASC) 5 MG tablet    Sig: Take 1 tablet (5 mg total) by mouth daily.    Dispense:  90 tablet    Refill:  0   doxazosin (CARDURA) 2 MG tablet    Sig: Take 1 tablet (2 mg total) by mouth 2 (two) times daily.    Dispense:  180 tablet    Refill:  3    Add refills on file   apixaban (ELIQUIS) 5 MG TABS tablet    Sig: Take 1 tablet (5 mg total) by mouth 2 (two) times daily.    Dispense:  60 tablet    Refill:  11    Add refills on file   atorvastatin (LIPITOR) 40 MG tablet    Sig: Take 1 tablet (40 mg total) by mouth daily.    Dispense:  90 tablet    Refill:  3    Add refills on file      Follow up plan: Return in about 6 months (around 08/08/2021) for 6 month follow-up HTN, BPH urinary symptoms.  Nobie Putnam, [facility] 02/07/2021, 9:07 AM

## 2021-02-08 LAB — COMPLETE METABOLIC PANEL WITH GFR
AG Ratio: 1.9 (calc) (ref 1.0–2.5)
ALT: 13 U/L (ref 9–46)
AST: 14 U/L (ref 10–35)
Albumin: 4.3 g/dL (ref 3.6–5.1)
Alkaline phosphatase (APISO): 68 U/L (ref 35–144)
BUN: 10 mg/dL (ref 7–25)
CO2: 27 mmol/L (ref 20–32)
Calcium: 9.1 mg/dL (ref 8.6–10.3)
Chloride: 106 mmol/L (ref 98–110)
Creat: 0.76 mg/dL (ref 0.70–1.22)
Globulin: 2.3 g/dL (calc) (ref 1.9–3.7)
Glucose, Bld: 96 mg/dL (ref 65–139)
Potassium: 4.3 mmol/L (ref 3.5–5.3)
Sodium: 141 mmol/L (ref 135–146)
Total Bilirubin: 0.7 mg/dL (ref 0.2–1.2)
Total Protein: 6.6 g/dL (ref 6.1–8.1)
eGFR: 89 mL/min/{1.73_m2} (ref >=60)

## 2021-02-08 LAB — LIPID PANEL
Cholesterol: 164 mg/dL (ref <200)
HDL: 48 mg/dL (ref >=40)
LDL Cholesterol (Calc): 91 mg/dL (calc)
Non-HDL Cholesterol (Calc): 116 mg/dL (calc) (ref <130)
Total CHOL/HDL Ratio: 3.4 (calc) (ref <5.0)
Triglycerides: 150 mg/dL — ABNORMAL HIGH (ref <150)

## 2021-02-08 LAB — CBC WITH DIFFERENTIAL/PLATELET
Absolute Monocytes: 609 cells/uL (ref 200–950)
Basophils Absolute: 21 cells/uL (ref 0–200)
Basophils Relative: 0.3 %
Eosinophils Absolute: 147 cells/uL (ref 15–500)
Eosinophils Relative: 2.1 %
HCT: 36.1 % — ABNORMAL LOW (ref 38.5–50.0)
Hemoglobin: 11.9 g/dL — ABNORMAL LOW (ref 13.2–17.1)
Lymphs Abs: 1813 cells/uL (ref 850–3900)
MCH: 30.7 pg (ref 27.0–33.0)
MCHC: 33 g/dL (ref 32.0–36.0)
MCV: 93 fL (ref 80.0–100.0)
MPV: 10.2 fL (ref 7.5–12.5)
Monocytes Relative: 8.7 %
Neutro Abs: 4410 cells/uL (ref 1500–7800)
Neutrophils Relative %: 63 %
Platelets: 331 10*3/uL (ref 140–400)
RBC: 3.88 10*6/uL — ABNORMAL LOW (ref 4.20–5.80)
RDW: 13.3 % (ref 11.0–15.0)
Total Lymphocyte: 25.9 %
WBC: 7 10*3/uL (ref 3.8–10.8)

## 2021-02-08 LAB — PSA: PSA: 2.58 ng/mL (ref <=4.00)

## 2021-02-08 LAB — HEMOGLOBIN A1C
Hgb A1c MFr Bld: 5.9 % of total Hgb — ABNORMAL HIGH (ref <5.7)
Mean Plasma Glucose: 123 mg/dL
eAG (mmol/L): 6.8 mmol/L

## 2021-04-04 ENCOUNTER — Telehealth: Payer: Self-pay

## 2021-04-04 NOTE — Telephone Encounter (Signed)
Copied from CRM [id number]. Topic: General - Inquiry >> Apr 04, 2021  8:07 AM Aretta Nip wrote: MYRBETRIQ 25 MG TB24 tablet 90 tablet 3 02/07/2021   Sig - Route: Take 1 tablet (25 mg total) by mouth daily. - Oral  Sent to pharmacy as: MYRBETRIQ 25 MG Tablet SR 24 hr tablet    Pt was not able to pay for cost of this med  so was not taking.After researching found since he receives some treatment from the [place] that this drug can be obtained thru [place] by means of the [facility]. The [facility] request a statement from Dr Kirtland Bouchard just stating why the pt needs the med.Pls fax drug reason for need to below # and then [facility] will receive the [facility] discount.. (Explanation of why he needs medication)  [facility] can [facility] to [facility] much cheaper  Fax #[phone]

## 2021-04-04 NOTE — Telephone Encounter (Signed)
I have written, printed, and signed this letter and it is ready to fax back.  [phone]  Saralyn Pilar, DO [facility] 04/04/2021, 11:22 AM

## 2021-06-02 ENCOUNTER — Other Ambulatory Visit: Payer: Self-pay | Admitting: Family Medicine

## 2021-06-02 DIAGNOSIS — N138 Other obstructive and reflux uropathy: Secondary | ICD-10-CM

## 2021-06-04 NOTE — Telephone Encounter (Signed)
Requested medication (s) are due for refill today: yes ? ?Requested medication (s) are on the active medication list: no ? ?Last refill:  03/04/21 ? ?Future visit scheduled: yes ? ?Notes to clinic:  rx was dc'd on 02/07/21. Please advise  ? ? ?  ?Requested Prescriptions  ?Pending Prescriptions Disp Refills  ? oxybutynin (DITROPAN-XL) 5 MG 24 hr tablet [Pharmacy Med Name: OXYBUTYNIN CL ER 5 MG TABLET] 90 tablet 1  ?  Sig: TAKE 1 TABLET BY MOUTH EVERYDAY AT BEDTIME  ?  ? Urology:  Bladder Agents Passed - 06/02/2021  9:10 AM  ?  ?  Passed - Valid encounter within last 12 months  ?  Recent Outpatient Visits   ? ?      ? 3 months ago BPH with obstruction/lower urinary tract symptoms  ? [facility] [place], Netta Neat, DO  ? 10 months ago Acute cystitis with hematuria  ? [facility] Althea Charon, Netta Neat, DO  ? 1 year ago Annual physical exam  ? [facility] Smitty Cords, DO  ? 1 year ago Screening for prostate cancer  ? [facility] Althea Charon, Netta Neat, DO  ? 2 years ago Annual physical exam  ? [facility] Smitty Cords, DO  ? ?  ?  ?Future Appointments   ? ?        ? In 2 months Althea Charon, Netta Neat, DO [facility]  ? In 5 months  [facility], [place]  ? ?  ? ?  ?  ?  ? ?

## 2021-06-07 ENCOUNTER — Other Ambulatory Visit: Payer: Self-pay | Admitting: Family Medicine

## 2021-06-07 DIAGNOSIS — N138 Other obstructive and reflux uropathy: Secondary | ICD-10-CM

## 2021-06-07 NOTE — Telephone Encounter (Signed)
Different medication was added and this was dc'd on 02/07/21.  ?Requested Prescriptions  ?Pending Prescriptions Disp Refills  ?? oxybutynin (DITROPAN-XL) 5 MG 24 hr tablet [Pharmacy Med Name: OXYBUTYNIN CL ER 5 MG TABLET] 90 tablet 1  ?  Sig: TAKE 1 TABLET BY MOUTH EVERYDAY AT BEDTIME  ?  ? Urology:  Bladder Agents Passed - 06/07/2021 10:11 AM  ?  ?  Passed - Valid encounter within last 12 months  ?  Recent Outpatient Visits   ?      ? 4 months ago BPH with obstruction/lower urinary tract symptoms  ? [facility] [place], Netta Neat, DO  ? 10 months ago Acute cystitis with hematuria  ? [facility] Althea Charon, Netta Neat, DO  ? 1 year ago Annual physical exam  ? [facility] Smitty Cords, DO  ? 1 year ago Screening for prostate cancer  ? [facility] Althea Charon, Netta Neat, DO  ? 2 years ago Annual physical exam  ? [facility] Smitty Cords, DO  ?  ?  ?Future Appointments   ?        ? In 2 months Althea Charon, Netta Neat, DO [facility]  ? In 5 months  [facility], [place]  ?  ? ?  ?  ?  ? ? ?

## 2021-06-08 ENCOUNTER — Other Ambulatory Visit: Payer: Self-pay | Admitting: Family Medicine

## 2021-06-08 DIAGNOSIS — N138 Other obstructive and reflux uropathy: Secondary | ICD-10-CM

## 2021-06-10 NOTE — Telephone Encounter (Signed)
Patient wife called in to inform Dr Raliegh Ip that patient is not taking the MYRBETRIQ 25 MG TB24 tablet due to it being too expensive. Asking if the oxybutynin (DITROPAN-XL) 5 MG 24 hr tablet can be refilled today please. ?

## 2021-06-10 NOTE — Telephone Encounter (Signed)
Requested medication (s) are due for refill today: yes ? ?Requested medication (s) are on the active medication list: no ? ?Last refill:  11/30/20 ? ?Future visit scheduled: Yes ? ?Notes to clinic:  Unable to refill per protocol, Rx expired.  ? ? ?  ?Requested Prescriptions  ?Pending Prescriptions Disp Refills  ? oxybutynin (DITROPAN-XL) 5 MG 24 hr tablet [Pharmacy Med Name: OXYBUTYNIN CL ER 5 MG TABLET] 90 tablet 1  ?  Sig: TAKE 1 TABLET BY MOUTH EVERYDAY AT BEDTIME  ?  ? Urology:  Bladder Agents Passed - 06/10/2021  8:14 AM  ?  ?  Passed - Valid encounter within last 12 months  ?  Recent Outpatient Visits   ? ?      ? 4 months ago BPH with obstruction/lower urinary tract symptoms  ? [facility], Netta Neat, DO  ? 10 months ago Acute cystitis with hematuria  ? [facility] Althea Charon, Netta Neat, DO  ? 1 year ago Annual physical exam  ? [facility] Smitty Cords, DO  ? 1 year ago Screening for prostate cancer  ? [facility] Althea Charon, Netta Neat, DO  ? 2 years ago Annual physical exam  ? [facility] Smitty Cords, DO  ? ?  ?  ?Future Appointments   ? ?        ? In 1 month Karamalegos, Netta Neat, DO [facility]  ? In 5 months  [facility], [place]  ? ?  ? ?  ?  ?  ? ? ?

## 2021-06-11 ENCOUNTER — Ambulatory Visit: Payer: Self-pay | Admitting: *Deleted

## 2021-06-11 DIAGNOSIS — R350 Frequency of micturition: Secondary | ICD-10-CM

## 2021-06-11 DIAGNOSIS — N138 Other obstructive and reflux uropathy: Secondary | ICD-10-CM

## 2021-06-11 MED ORDER — OXYBUTYNIN CHLORIDE ER 5 MG PO TB24: 5.0000 mg | ORAL_TABLET | Freq: Every day | ORAL | 3 refills | Status: DC

## 2021-06-11 NOTE — Telephone Encounter (Signed)
?  Chief Complaint: medication expensive ?Symptoms: none ?Frequency: wants Oxybutrin back ?Pertinent Negatives: Patient denies na ?Disposition: [] ED /[] Urgent Care (no appt availability in office) / [] Appointment(In office/virtual)/ []  [facility]/ [] Home Care/ [] Refused Recommended Disposition /[] [facility]/ []  Follow-up with PCP ?Additional Notes: Pt states Myrbetriq is $200 a month, he wants to go back on Oxybutynin XL 5mg  but it is not on his medication list. Asking if Dr. will call in Oxybutynin XL 5mg  for him like he has taken in the past. ? ?Reason for Disposition ? [1] Caller has NON-URGENT medicine question about med that PCP prescribed AND [2] triager unable to answer question ? ?Answer Assessment - Initial Assessment Questions ?1. NAME of MEDICATION: "What medicine are you calling about?" ?    Wants Ditropan back ?2. QUESTION: "What is your question?" (e.g., double dose of medicine, side effect) ?    Myrbetriq too expensive, $200 ?3. PRESCRIBING HCP: "Who prescribed it?" Reason: if prescribed by specialist, call should be referred to that group. ?    Dr. ?4. SYMPTOMS: "Do you have any symptoms?" ?    no ?5. SEVERITY: If symptoms are present, ask "Are they mild, moderate or severe?" ?    na ?6. PREGNANCY:  "Is there any chance that you are pregnant?" "When was your last menstrual period?" ?    na ? ?Protocols used: Medication Question Call-A-AH ? ?

## 2021-06-11 NOTE — Addendum Note (Signed)
Addended by: Smitty Cords on: 06/11/2021 12:15 PM ? ? Modules accepted: Orders ? ?

## 2021-06-25 ENCOUNTER — Other Ambulatory Visit: Payer: Self-pay | Admitting: Family Medicine

## 2021-06-25 DIAGNOSIS — I10 Essential (primary) hypertension: Secondary | ICD-10-CM

## 2021-06-27 NOTE — Telephone Encounter (Signed)
Requested Prescriptions  ?Pending Prescriptions Disp Refills  ?? amLODipine (NORVASC) 5 MG tablet [Pharmacy Med Name: AMLODIPINE BESYLATE 5 MG TAB] 90 tablet 0  ?  Sig: TAKE 1 TABLET (5 MG TOTAL) BY MOUTH DAILY.  ?  ? Cardiovascular: Calcium Channel Blockers 2 Failed - 06/25/2021  2:30 PM  ?  ?  Failed - Last BP in normal range  ?  BP Readings from Last 1 Encounters:  ?02/07/21 (!) 142/60  ?   ?  ?  Passed - Last Heart Rate in normal range  ?  Pulse Readings from Last 1 Encounters:  ?02/07/21 60  ?   ?  ?  Passed - Valid encounter within last 6 months  ?  Recent Outpatient Visits   ?      ? 4 months ago BPH with obstruction/lower urinary tract symptoms  ? [place], DO  ? 10 months ago Acute cystitis with hematuria  ? [place], DO  ? 1 year ago Annual physical exam  ? [place], DO  ? 1 year ago Screening for prostate cancer  ? [place], DO  ? 2 years ago Annual physical exam  ? [place], DO  ?  ?  ?Future Appointments   ?        ? In 1 month Karamalegos, Devonne Doughty, DO [facility], [place]  ? In 5 months  [facility], [place]  ?  ? ?  ?  ?  ? ?

## 2021-08-08 ENCOUNTER — Other Ambulatory Visit: Payer: Self-pay | Admitting: Family Medicine

## 2021-08-08 ENCOUNTER — Encounter: Payer: Self-pay | Admitting: Family Medicine

## 2021-08-08 ENCOUNTER — Ambulatory Visit (INDEPENDENT_AMBULATORY_CARE_PROVIDER_SITE_OTHER): Payer: Medicare Other | Admitting: Family Medicine

## 2021-08-08 VITALS — BP 138/68 | HR 62 | Temp 97.7°F | Resp 18 | Ht 69.0 in | Wt 228.6 lb

## 2021-08-08 DIAGNOSIS — I10 Essential (primary) hypertension: Secondary | ICD-10-CM | POA: Diagnosis not present

## 2021-08-08 DIAGNOSIS — N401 Enlarged prostate with lower urinary tract symptoms: Secondary | ICD-10-CM | POA: Diagnosis not present

## 2021-08-08 DIAGNOSIS — N138 Other obstructive and reflux uropathy: Secondary | ICD-10-CM

## 2021-08-08 MED ORDER — OXYBUTYNIN CHLORIDE ER 5 MG PO TB24: 10.0000 mg | ORAL_TABLET | Freq: Every day | ORAL | 1 refills | Status: DC

## 2021-08-08 MED ORDER — AMLODIPINE BESYLATE 5 MG PO TABS: 5.0000 mg | ORAL_TABLET | Freq: Every day | ORAL | 3 refills | Status: DC

## 2021-08-08 NOTE — Progress Notes (Signed)
Subjective:    Patient ID: Erik Palmer, male    DOB: [birth year], 87 y.o.   MRN: [id number]  Erik Palmer is a 87 y.o. male presenting on 08/08/2021 for Hypertension and Benign Prostatic Hypertrophy   HPI  CHRONIC HTN: Reports elevated BP here, and home readings avg 130/70s Current Meds - Amlodipine 40m daily, Carvedilol 6.25mBID, Doxazosin 57m35mID, Lisinopril 2.5mg23mily   Reports good compliance, took meds today. Tolerating well, w/o complaints. Denies CP, dyspnea, HA, edema, dizziness / lightheadedness   Elevated A1c A1c average 5.6 to 5.7, last lab 5.7 stable without new concerns. Due A1c lab. CBGs: not checking CBG regularly Meds: never on med Currently not on ACEi / ARB Lifestyle: - Diet (improved diet)  - Exercise (limited but he is improving exercise) Denies hypoglycemia   PMH - Cardiology / Permanent Atrial Fibrillation / HTN    BPH with LUTS Gross Hematuria UTI Admits dark urine only after soda. Otherwise not a problem.   [facility] [place] and DRE showed normal exam by report, he was treated for UTI. Improved on Doxazosin currently but still complaints of significant LUTS. He has had issues with bladder spasms and urinary frequency but reduced volume and LUTS Obstructive symptoms - Previously last seen 01/2019 by me we tried to order Myrbetriq but cost too high, Detrol LA was not covered or preferred. He was initiated on oxybutynin XL 5mg 71mhtly, he has done fairly well but it has not resolved his nocturia and urinary LUTS.   Failed Oxybutynin - now ready to trial Myrbetriq again.  We did the Myrbetriq paperwork and letter last time [facility] but he has not heard back yet, has upcoming apt and he will discuss with them. Prior cost was up to $230     Major Depression recurrent mild vs remission / Insomnia Taking Seroquel 50mg 47mtly currently and doing well     Health Maintenance:    Elevated PSA / Prostate CA Screening: PSA trend 2.3 > [facility] 2.7 [facility] now recent trend 3.6 (01/2019) to 3.2 (06/2019) now last updated PSA 01/2020 was PSA 4.12       08/08/2021   10:46 AM 02/07/2021    1:20 PM 11/20/2020    8:22 AM  Depression screen PHQ 2/9  Decreased Interest 1 0 0  Down, Depressed, Hopeless 0 0 0  PHQ - 2 Score 1 0 0  Altered sleeping 0 0   Tired, decreased energy 0 0   Change in appetite 1 0   Feeling bad or failure about yourself  0 0   Trouble concentrating 3 0   Moving slowly or fidgety/restless 0 0   Suicidal thoughts 0 0   PHQ-9 Score 5 0   Difficult doing work/chores  Not difficult at all     Social History   Tobacco Use   Smoking status: Never   Smokeless tobacco: Never  Vaping Use   Vaping Use: Never used  Substance Use Topics   Alcohol use: No    Alcohol/week: 0.0 standard drinks of alcohol   Drug use: No    Review of Systems Per HPI unless specifically indicated above     Objective:    BP 138/68 (BP Location: Left Arm, Cuff Size: Normal)   Pulse 62   Temp 97.7 F (36.5 C) (Oral)   Resp 18   Ht _0  (1.753 m)   Wt  228 lb 9.6 oz (103.7 kg)   SpO2 99%   BMI 33.76 kg/m   Wt Readings from Last 3 Encounters:  08/08/21 228 lb 9.6 oz (103.7 kg)  02/07/21 228 lb 6.4 oz (103.6 kg)  11/20/20 220 lb (99.8 kg)    Physical Exam Vitals and nursing note reviewed.  Constitutional:      General: He is not in acute distress.    Appearance: Normal appearance. He is well-developed. He is not diaphoretic.     Comments: Well-appearing, comfortable, cooperative  HENT:     Head: Normocephalic and atraumatic.  Eyes:     General:        Right eye: No discharge.        Left eye: No discharge.     Conjunctiva/sclera: Conjunctivae normal.  Cardiovascular:     Rate and Rhythm: Normal rate.  Pulmonary:     Effort: Pulmonary effort is normal.  Skin:    General: Skin is warm and dry.     Findings: No erythema or rash.   Neurological:     Mental Status: He is alert and oriented to person, place, and time.  Psychiatric:        Mood and Affect: Mood normal.        Behavior: Behavior normal.        Thought Content: Thought content normal.     Comments: Well groomed, good eye contact, normal speech and thoughts       Results for orders placed or performed in visit on 02/07/21  COMPLETE METABOLIC PANEL WITH GFR  Result Value Ref Range   Glucose, Bld 96 65 - 139 mg/dL   BUN 10 7 - 25 mg/dL   Creat 0.76 0.70 - 1.22 mg/dL   eGFR 89 > OR = 60 mL/min/1.26m   BUN/Creatinine Ratio NOT APPLICABLE 6 - 22 (calc)   Sodium 141 135 - 146 mmol/L   Potassium 4.3 3.5 - 5.3 mmol/L   Chloride 106 98 - 110 mmol/L   CO2 27 20 - 32 mmol/L   Calcium 9.1 8.6 - 10.3 mg/dL   Total Protein 6.6 6.1 - 8.1 g/dL   Albumin 4.3 3.6 - 5.1 g/dL   Globulin 2.3 1.9 - 3.7 g/dL (calc)   AG Ratio 1.9 1.0 - 2.5 (calc)   Total Bilirubin 0.7 0.2 - 1.2 mg/dL   Alkaline phosphatase (APISO) 68 35 - 144 U/L   AST 14 10 - 35 U/L   ALT 13 9 - 46 U/L  Lipid panel  Result Value Ref Range   Cholesterol 164 <200 mg/dL   HDL 48 > OR = 40 mg/dL   Triglycerides 150 (H) <150 mg/dL   LDL Cholesterol (Calc) 91 mg/dL (calc)   Total CHOL/HDL Ratio 3.4 <5.0 (calc)   Non-HDL Cholesterol (Calc) 116 <130 mg/dL (calc)  CBC with Differential/Platelet  Result Value Ref Range   WBC 7.0 3.8 - 10.8 Thousand/uL   RBC 3.88 (L) 4.20 - 5.80 Million/uL   Hemoglobin 11.9 (L) 13.2 - 17.1 g/dL   HCT 36.1 (L) 38.5 - 50.0 %   MCV 93.0 80.0 - 100.0 fL   MCH 30.7 27.0 - 33.0 pg   MCHC 33.0 32.0 - 36.0 g/dL   RDW 13.3 11.0 - 15.0 %   Platelets 331 140 - 400 Thousand/uL   MPV 10.2 7.5 - 12.5 fL   Neutro Abs 4,410 1,500 - 7,800 cells/uL   Lymphs Abs 1,813 850 - 3,900 cells/uL   Absolute Monocytes 609 200 -  950 cells/uL   Eosinophils Absolute 147 15 - 500 cells/uL   Basophils Absolute 21 0 - 200 cells/uL   Neutrophils Relative % 63 %   Total Lymphocyte 25.9 %    Monocytes Relative 8.7 %   Eosinophils Relative 2.1 %   Basophils Relative 0.3 %  Hemoglobin A1c  Result Value Ref Range   Hgb A1c MFr Bld 5.9 (H) <5.7 % of total Hgb   Mean Plasma Glucose 123 mg/dL   eAG (mmol/L) 6.8 mmol/L  PSA  Result Value Ref Range   PSA 2.58 < OR = 4.00 ng/mL      Assessment & Plan:   Problem List Items Addressed This Visit     Essential hypertension - Primary   Relevant Medications   amLODipine (NORVASC) 5 MG tablet   BPH with obstruction/lower urinary tract symptoms   Relevant Medications   oxybutynin (DITROPAN-XL) 5 MG 24 hr tablet    BPH / OAB Try to get the Myrbetriq at lower cost to trial for at least 1 month  If you do take it, then stop the Oxybutynin  We have given you more pills Oxybutynin XL 5, you can take TWO daily for now and if need you can reduce dose back to 1 if you prefer.  [facility]   HTN  Controlled at home, stable readings. No change today. Improve on repeat Refilled Amlodipine  Meds ordered this encounter  Medications   oxybutynin (DITROPAN-XL) 5 MG 24 hr tablet    Sig: Take 2 tablets (10 mg total) by mouth daily. May reduce dose back to 70m daily if preferred due to possible side effect.    Dispense:  180 tablet    Refill:  1    DX Code Needed  N40.1, N13.8   amLODipine (NORVASC) 5 MG tablet    Sig: Take 1 tablet (5 mg total) by mouth daily.    Dispense:  90 tablet    Refill:  3      Follow up plan: Return in about 6 months (around 02/07/2022) for 6 month fasting lab only then 1 week later Annual Physical.   ANobie Putnam DO [facility] 08/08/2021, 10:58 AM

## 2021-08-08 NOTE — Patient Instructions (Addendum)
Thank you for coming to the office today.  Try to get the Myrbetriq at lower cost to trial for at least 1 month  If you do take it, then stop the Oxybutynin  We have given you more pills Oxybutynin XL 5, you can take TWO daily for now and if need you can reduce dose back to 1 if you prefer.  Refilled Amlodipine  1. Chemistry - Normal  results, including electrolytes, kidney and liver function. Normal fasting blood sugar    2. Hemoglobin A1c (Diabetes screening) - 5.9, in range of Pre-Diabetes (>5.7 to 6.4)    3. PSA Prostate Cancer Screening - 2.58, negative.   4. Cholesterol - Controlled on atorvastatin   5. CBC Blood Counts - Stable slightly low hemoglobin anemia.  Lipid Panel     Component Value Date/Time   CHOL 164 02/07/2021 0913   CHOL 185 05/21/2015 0901   TRIG 150 (H) 02/07/2021 0913   HDL 48 02/07/2021 0913   HDL 45 05/21/2015 0901   CHOLHDL 3.4 02/07/2021 0913   VLDL 29 04/16/2016 0944   LDLCALC 91 02/07/2021 0913   LABVLDL 48 (H) 05/21/2015 0901   DUE for FASTING BLOOD WORK (no food or drink after midnight before the lab appointment, only water or coffee without cream/sugar on the morning of)  SCHEDULE "Lab Only" visit in the morning at the clinic for lab draw in 6 MONTHS   - Make sure Lab Only appointment is at about 1 week before your next appointment, so that results will be available  For Lab Results, once available within 2-3 days of blood draw, you can can log in to MyChart online to view your results and a brief explanation. Also, we can discuss results at next follow-up visit.    Please schedule a Follow-up Appointment to: Return in about 6 months (around 02/07/2022) for 6 month fasting lab only then 1 week later Annual Physical.  If you have any other questions or concerns, please feel free to call the office or send a message through MyChart. You may also schedule an earlier appointment if necessary.  Additionally, you may be receiving a survey about  your experience at our office within a few days to 1 week by e-mail or mail. We value your feedback.  Saralyn Pilar, DO [facility] [place], [place]

## 2021-08-09 NOTE — Telephone Encounter (Signed)
Please call patient and notify them that the insurance will not pay for 2 pills per day. Only other option is I can order the 10mg  dose for 1 pill per day Oxybutynin.  Let me know if he is okay for me to order the 10mg .  He should only take one per day.  , DO [facility] 08/09/2021, 9:02 AM

## 2021-08-09 NOTE — Telephone Encounter (Signed)
Requested medication (s) are due for refill today:   Yes prescribed yesterday  Requested medication (s) are on the active medication list:   Yes  Future visit scheduled:   Yes   Last ordered: Yesterday for #180, 1 refill but needs a PA.   See pharmacy note   Requested Prescriptions  Pending Prescriptions Disp Refills   oxybutynin (DITROPAN-XL) 5 MG 24 hr tablet [Pharmacy Med Name: OXYBUTYNIN CL ER 5 MG TABLET] 180 tablet 1    Sig: TAKE 2 TABLETS DAILY. MAY REDUCE DOSE BACK TO 5MG  DAILY IF PREFERRED DUE TO POSSIBLE SIDE EFFECT.     Urology:  Bladder Agents Passed - 08/08/2021 12:59 PM      Passed - Valid encounter within last 12 months    Recent Outpatient Visits           Yesterday Essential hypertension   [facility] [place], [place], DO   6 months ago BPH with obstruction/lower urinary tract symptoms   [facility] [place], [place], DO   1 year ago Acute cystitis with hematuria   [facility], DO   1 year ago Annual physical exam   [facility], DO   2 years ago Screening for prostate cancer   [facility], DO       Future Appointments             In 3 months  [facility], [facility]   In 6 months [facility], Althea Charon, DO [facility], [facility]

## 2021-08-12 ENCOUNTER — Other Ambulatory Visit: Payer: Self-pay | Admitting: Family Medicine

## 2021-08-12 DIAGNOSIS — N401 Enlarged prostate with lower urinary tract symptoms: Secondary | ICD-10-CM

## 2021-08-13 NOTE — Telephone Encounter (Signed)
Requested medication (s) are due for refill today: needs PA  Requested medication (s) are on the active medication list: yes  Last refill:  08/08/21 #180 1 refill  Future visit scheduled: yes in 6 months   Notes to clinic:  Pharmacy comment: Alternative Requested:PA FOR TWO TABLETS DAILY. Please advise      Requested Prescriptions  Pending Prescriptions Disp Refills   oxybutynin (DITROPAN-XL) 5 MG 24 hr tablet [Pharmacy Med Name: OXYBUTYNIN CL ER 5 MG TABLET] 180 tablet 1    Sig: TAKE 2 TABLETS DAILY. MAY REDUCE DOSE BACK TO 5MG  DAILY IF PREFERRED DUE TO POSSIBLE SIDE EFFECT.     Urology:  Bladder Agents Passed - 08/12/2021  4:01 PM      Passed - Valid encounter within last 12 months    Recent Outpatient Visits           5 days ago Essential hypertension   [place], DO   6 months ago BPH with obstruction/lower urinary tract symptoms   [place], DO   1 year ago Acute cystitis with hematuria   [place], DO   1 year ago Annual physical exam   [facility] Olin Hauser, DO   2 years ago Screening for prostate cancer   [place], DO       Future Appointments             In 3 months  [facility], [place]   In 6 months [place], Devonne Doughty, [facility], [facility]

## 2021-08-29 DIAGNOSIS — H35372 Puckering of macula, left eye: Secondary | ICD-10-CM | POA: Diagnosis not present

## 2021-09-25 DIAGNOSIS — H2511 Age-related nuclear cataract, right eye: Secondary | ICD-10-CM | POA: Diagnosis not present

## 2021-10-02 ENCOUNTER — Encounter: Payer: Self-pay | Admitting: Ophthalmology

## 2021-10-09 NOTE — Anesthesia Preprocedure Evaluation (Addendum)
Anesthesia Evaluation  Patient identified by MRN, date of birth, ID band Patient awake    Reviewed: Allergy & Precautions, NPO status , Patient's Chart, lab work & pertinent test results, reviewed documented beta blocker date and time   History of Anesthesia Complications Negative for: history of anesthetic complications  Airway Mallampati: III  TM Distance: >3 FB Neck ROM: full    Dental  (+) Chipped, Poor Dentition   Pulmonary neg pulmonary ROS,    Pulmonary exam normal        Cardiovascular hypertension, Pt. on medications and Pt. on home beta blockers + CAD and + CABG (2019)  + dysrhythmias Atrial Fibrillation  Rhythm:Regular Rate:Normal     Neuro/Psych PSYCHIATRIC DISORDERS Anxiety negative neurological ROS     GI/Hepatic Neg liver ROS, GERD  ,  Endo/Other  H/o SIADH (syndrome of inappropriate ADH production)  Renal/GU      Musculoskeletal  (+) Arthritis ,   Abdominal (+) + obese,   Peds  Hematology negative hematology ROS (+)   Anesthesia Other Findings Past Medical History: No date: Angina pectoris syndrome (HCC) 09/03/2014: Anxiety disorder due to general medical condition No date: Arthritis No date: GERD (gastroesophageal reflux disease) 09/03/2014: Grief at loss of child No date: Hypercholesteremia No date: Hypertension No date: Prostate enlargement  Past Surgical History: 03/25/2017: CORONARY ARTERY BYPASS GRAFT     [facility] T[facility] HIP ARTHROPLASTY; Right     Comment:  Procedure: TOTAL HIP ARTHROPLASTY ANTERIOR APPROACH;                Surgeon: Michael Menz, MD;  Location: [facility] ORS;  Service:              Orthopedics;  Laterality: Right;  BMI    Body Mass Index: 33.67 kg/m      Reproductive/Obstetrics negative OB ROS                             Anesthesia Physical  Anesthesia Plan  ASA: 3  Anesthesia Plan: MAC   Post-op Pain Management:  Minimal or no pain anticipated   Induction: Intravenous  PONV Risk Score and Plan: Treatment may vary due to age or medical condition  Airway Management Planned: Natural Airway  Additional Equipment:   Intra-op Plan:   Post-operative Plan:   Informed Consent: I have reviewed the patients History and Physical, chart, labs and discussed the procedure including the risks, benefits and alternatives for the proposed anesthesia with the patient or authorized representative who has indicated his/her understanding and acceptance.       Plan Discussed with: Anesthesiologist, CRNA and Surgeon  Anesthesia Plan Comments:         Anesthesia Quick Evaluation  

## 2021-10-09 NOTE — Discharge Instructions (Signed)

## 2021-10-14 ENCOUNTER — Ambulatory Visit: Payer: Medicare Other | Admitting: Anesthesiology

## 2021-10-14 ENCOUNTER — Ambulatory Visit
Admission: RE | Admit: 2021-10-14 | Discharge: 2021-10-14 | Disposition: A | Discharge status: Home / Self Care | Payer: Medicare Other | Attending: Ophthalmology | Admitting: Ophthalmology

## 2021-10-14 ENCOUNTER — Encounter
Admission: RE | Disposition: A | Discharge status: Home / Self Care | Payer: Self-pay | Source: Home / Self Care | Attending: Ophthalmology

## 2021-10-14 ENCOUNTER — Other Ambulatory Visit: Payer: Self-pay

## 2021-10-14 ENCOUNTER — Ambulatory Visit (AMBULATORY_SURGERY_CENTER): Payer: Medicare Other | Admitting: Anesthesiology

## 2021-10-14 DIAGNOSIS — E7800 Pure hypercholesterolemia, unspecified: Secondary | ICD-10-CM | POA: Insufficient documentation

## 2021-10-14 DIAGNOSIS — K219 Gastro-esophageal reflux disease without esophagitis: Secondary | ICD-10-CM | POA: Insufficient documentation

## 2021-10-14 DIAGNOSIS — Z79899 Other long term (current) drug therapy: Secondary | ICD-10-CM | POA: Diagnosis not present

## 2021-10-14 DIAGNOSIS — I10 Essential (primary) hypertension: Secondary | ICD-10-CM | POA: Insufficient documentation

## 2021-10-14 DIAGNOSIS — H2511 Age-related nuclear cataract, right eye: Secondary | ICD-10-CM | POA: Insufficient documentation

## 2021-10-14 DIAGNOSIS — E222 Syndrome of inappropriate secretion of antidiuretic hormone: Secondary | ICD-10-CM | POA: Diagnosis not present

## 2021-10-14 DIAGNOSIS — I4891 Unspecified atrial fibrillation: Secondary | ICD-10-CM

## 2021-10-14 DIAGNOSIS — Z7901 Long term (current) use of anticoagulants: Secondary | ICD-10-CM | POA: Diagnosis not present

## 2021-10-14 DIAGNOSIS — I2510 Atherosclerotic heart disease of native coronary artery without angina pectoris: Secondary | ICD-10-CM | POA: Diagnosis not present

## 2021-10-14 DIAGNOSIS — F419 Anxiety disorder, unspecified: Secondary | ICD-10-CM | POA: Diagnosis not present

## 2021-10-14 DIAGNOSIS — H5703 Miosis: Secondary | ICD-10-CM | POA: Diagnosis not present

## 2021-10-14 DIAGNOSIS — Z951 Presence of aortocoronary bypass graft: Secondary | ICD-10-CM | POA: Insufficient documentation

## 2021-10-14 HISTORY — PX: CATARACT EXTRACTION W/PHACO: SHX586

## 2021-10-14 SURGERY — PHACOEMULSIFICATION, CATARACT, WITH IOL INSERTION
Anesthesia: Monitor Anesthesia Care | Site: Eye | Laterality: Right

## 2021-10-14 MED ORDER — ARMC OPHTHALMIC DILATING DROPS
1.0000 | OPHTHALMIC | Status: DC | PRN
  Administered 2021-10-14 (×3): 1 via OPHTHALMIC

## 2021-10-14 MED ORDER — SIGHTPATH DOSE#1 BSS IO SOLN
INTRAOCULAR | Status: DC | PRN
  Administered 2021-10-14: 15 mL

## 2021-10-14 MED ORDER — BRIMONIDINE TARTRATE-TIMOLOL 0.2-0.5 % OP SOLN
OPHTHALMIC | Status: DC | PRN
  Administered 2021-10-14: 1 [drp] via OPHTHALMIC

## 2021-10-14 MED ORDER — FENTANYL CITRATE (PF) 100 MCG/2ML IJ SOLN
INTRAMUSCULAR | Status: DC | PRN
  Administered 2021-10-14 (×2): 50 ug via INTRAVENOUS

## 2021-10-14 MED ORDER — SIGHTPATH DOSE#1 NA HYALUR & NA CHOND-NA HYALUR IO KIT
PACK | INTRAOCULAR | Status: DC | PRN
  Administered 2021-10-14: 1 via OPHTHALMIC

## 2021-10-14 MED ORDER — CEFUROXIME OPHTHALMIC INJECTION 1 MG/0.1 ML
INJECTION | OPHTHALMIC | Status: DC | PRN
  Administered 2021-10-14: 0.1 mL via INTRACAMERAL

## 2021-10-14 MED ORDER — SIGHTPATH DOSE#1 BSS IO SOLN
INTRAOCULAR | Status: DC | PRN
  Administered 2021-10-14: 67 mL via OPHTHALMIC

## 2021-10-14 MED ORDER — SIGHTPATH DOSE#1 BSS IO SOLN
INTRAOCULAR | Status: DC | PRN
  Administered 2021-10-14: 1 mL via INTRAMUSCULAR

## 2021-10-14 MED ORDER — TETRACAINE HCL 0.5 % OP SOLN
1.0000 [drp] | OPHTHALMIC | Status: DC | PRN
  Administered 2021-10-14 (×3): 1 [drp] via OPHTHALMIC

## 2021-10-14 MED ORDER — MIDAZOLAM HCL 2 MG/2ML IJ SOLN
INTRAMUSCULAR | Status: DC | PRN
  Administered 2021-10-14: 2 mg via INTRAVENOUS

## 2021-10-14 SURGICAL SUPPLY — 12 items
CATARACT SUITE SIGHTPATH (MISCELLANEOUS) ×2 IMPLANT
FEE CATARACT SUITE SIGHTPATH (MISCELLANEOUS) ×2 IMPLANT
GLOVE SRG 8 PF TXTR STRL LF DI (GLOVE) ×2 IMPLANT
GLOVE SURG ENC TEXT LTX SZ7.5 (GLOVE) ×3 IMPLANT
GLOVE SURG UNDER POLY LF SZ8 (GLOVE) ×2
LENS IOL TECNIS EYHANCE 19.5 (Intraocular Lens) ×1 IMPLANT
NDL FILTER BLUNT 18X1 1/2 (NEEDLE) ×2 IMPLANT
NEEDLE FILTER BLUNT 18X 1/2SAF (NEEDLE) ×1
NEEDLE FILTER BLUNT 18X1 1/2 (NEEDLE) ×1 IMPLANT
RING MALYGIN 7.0 (MISCELLANEOUS) ×1 IMPLANT
SYR 3ML LL SCALE MARK (SYRINGE) ×3 IMPLANT
WATER STERILE IRR 250ML POUR (IV SOLUTION) ×3 IMPLANT

## 2021-10-14 NOTE — Transfer of Care (Signed)
Immediate Anesthesia Transfer of Care Note  Patient: Erik Palmer  Procedure(s) Performed: CATARACT EXTRACTION PHACO AND INTRAOCULAR LENS PLACEMENT (IOC) RIGHT 11.27 01:28.4 (Right: Eye)  Patient Location: PACU  Anesthesia Type: MAC  Level of Consciousness: awake, alert  and patient cooperative  Airway and Oxygen Therapy: Patient Spontanous Breathing and Patient connected to supplemental oxygen  Post-op Assessment: Post-op Vital signs reviewed, Patient's Cardiovascular Status Stable, Respiratory Function Stable, Patent Airway and No signs of Nausea or vomiting  Post-op Vital Signs: Reviewed and stable  Complications: No notable events documented.

## 2021-10-14 NOTE — Op Note (Signed)
  OPERATIVE NOTE  Erik Palmer [id number] 10/14/2021   PREOPERATIVE DIAGNOSIS:    Nuclear Sclerotic Cataract Right eye with miotic pupil.        H25.11  POSTOPERATIVE DIAGNOSIS: Nuclear Sclerotic Cataract Right eye with miotic pupil.          PROCEDURE:  Phacoemusification with posterior chamber intraocular lens placement of the right eye which required pupil stretching with the Malyugin pupil expansion device. Ultrasound time: Procedure(s): CATARACT EXTRACTION PHACO AND INTRAOCULAR LENS PLACEMENT (IOC) RIGHT 11.27 01:28.4 (Right)  LENS:   Implant Name Type Inv. Item Serial No. Manufacturer Lot No. LRB No. Used Action  LENS IOL TECNIS EYHANCE 19.5 - [id number] Intraocular Lens LENS IOL TECNIS EYHANCE 19.5 [id number] [employer]  Right 1 Implanted        SURGEON:  Deirdre Evener, MD   ANESTHESIA:  Topical with tetracaine drops and 2% Xylocaine jelly, augmented with 1% preservative-free intracameral lidocaine.   COMPLICATIONS:  None.   DESCRIPTION OF PROCEDURE:  The patient was identified in the holding room and transported to the operating room and placed in the supine position under the operating microscope. Theright eye was identified as the operative eye and it was prepped and draped in the usual sterile ophthalmic fashion.   A 1 millimeter clear-corneal paracentesis was made at the 12:00 position.  0.5 ml of preservative-free 1% lidocaine was injected into the anterior chamber. The anterior chamber was filled with Viscoat viscoelastic.  A 2.4 millimeter keratome was used to make a near-clear corneal incision at the 9:00 position. A Malyugin pupil expander was then placed through the main incision and into the anterior chamber of the eye.  The edge of the iris was secured on the lip of the pupil expander and it was released, thereby expanding the pupil to approximately 7 millimeters for completion of the cataract surgery.  Additional Viscoat was placed in the anterior  chamber.  A cystotome and capsulorrhexis forceps were used to make a curvilinear capsulorrhexis.   Balanced salt solution was used to hydrodissect and hydrodelineate the lens nucleus.   Phacoemulsification was used in stop and chop fashion to remove the lens, nucleus and epinucleus.  The remaining cortex was aspirated using the irrigation aspiration handpiece.  Additional Provisc was placed into the eye to distend the capsular bag for lens placement.  A lens was then injected into the capsular bag.  The pupil expanding ring was removed using a Kuglen hook and insertion device. The remaining viscoelastic was aspirated from the capsular bag and the anterior chamber.  The anterior chamber was filled with balanced salt solution to inflate to a physiologic pressure.  Wounds were hydrated with balanced salt solution.  The anterior chamber was inflated to a physiologic pressure with balanced salt solution.  No wound leaks were noted.Cefuroxime 0.1 ml of a 10mg /ml solution was injected into the anterior chamber for a dose of 1 mg of intracameral antibiotic at the completion of the case. Timolol and Brimonidine drops were applied to the eye.  The patient was taken to the recovery room in stable condition without complications of anesthesia or surgery.  Erik Palmer 10/14/2021, 1:45 PM

## 2021-10-14 NOTE — Anesthesia Postprocedure Evaluation (Signed)
Anesthesia Post Note  Patient: Erik Palmer  Procedure(s) Performed: CATARACT EXTRACTION PHACO AND INTRAOCULAR LENS PLACEMENT (IOC) RIGHT 11.27 01:28.4 (Right: Eye)     Patient location during evaluation: PACU Anesthesia Type: MAC Level of consciousness: awake and alert Pain management: pain level controlled Vital Signs Assessment: post-procedure vital signs reviewed and stable Respiratory status: spontaneous breathing, nonlabored ventilation and respiratory function stable Cardiovascular status: blood pressure returned to baseline and stable Postop Assessment: no apparent nausea or vomiting Anesthetic complications: no   No notable events documented.  [place] Ouch

## 2021-10-14 NOTE — H&P (Signed)
[facility]   Primary Care Physician:  Smitty Cords, DO Ophthalmologist: Dr. Lockie Mola  Pre-Procedure History & Physical: HPI:  Erik Palmer is a 87 y.o. male here for ophthalmic surgery.   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   Arthritis    GERD (gastroesophageal reflux disease)    Grief at loss of child 09/03/2014   Hypercholesteremia    Hypertension    Prostate enlargement     Past Surgical History:  Procedure Laterality Date   CORONARY ARTERY BYPASS GRAFT  03/25/2017   at [facility]   TOTAL HIP ARTHROPLASTY Right 12/05/2015   Procedure: TOTAL HIP ARTHROPLASTY ANTERIOR APPROACH;  Surgeon: Kennedy Bucker, MD;  Location: [facility] ORS;  Service: Orthopedics;  Laterality: Right;    Prior to Admission medications   Medication Sig Start Date End Date Taking? Authorizing Provider  acetaminophen (TYLENOL) 650 MG CR tablet Take 650 mg by mouth 2 (two) times daily. Reported on 05/08/2015   Yes [provider]  amLODipine (NORVASC) 5 MG tablet Take 1 tablet (5 mg total) by mouth daily. 08/08/21  Yes Karamalegos, Netta Neat, DO  apixaban (ELIQUIS) 5 MG TABS tablet Take 1 tablet (5 mg total) by mouth 2 (two) times daily. 02/07/21  Yes Karamalegos, Netta Neat, DO  atorvastatin (LIPITOR) 40 MG tablet Take 1 tablet (40 mg total) by mouth daily. 02/07/21  Yes Karamalegos, Netta Neat, DO  carvedilol (COREG) 6.25 MG tablet Take 6.25 mg by mouth 2 (two) times daily with a meal.   Yes [provider]  doxazosin (CARDURA) 2 MG tablet Take 1 tablet (2 mg total) by mouth 2 (two) times daily. 02/07/21  Yes Karamalegos, Netta Neat, DO  fluticasone (FLONASE) 50 MCG/ACT nasal spray SPRAY 2 SPRAYS INTO EACH NOSTRIL EVERY DAY 07/12/20  Yes Karamalegos, Netta Neat, DO  lisinopril (ZESTRIL) 5 MG tablet Take 2.5 mg by mouth daily. 03/01/20  Yes [provider]  omeprazole  (PRILOSEC) 20 MG capsule Take 1 capsule (20 mg total) by mouth 2 (two) times daily. 01/12/21  Yes Karamalegos, Netta Neat, DO  oxybutynin (DITROPAN-XL) 5 MG 24 hr tablet Take 1 tablet (5 mg total) by mouth at bedtime. 08/13/21  Yes Karamalegos, Netta Neat, DO  QUEtiapine (SEROQUEL) 50 MG tablet Take 1 tablet (50 mg total) by mouth at bedtime. 01/14/21  Yes Karamalegos, Netta Neat, DO    Allergies as of 09/04/2021 - Review Complete 08/08/2021  Allergen Reaction Noted   Solu-medrol [methylprednisolone sodium succ] Anxiety 09/12/2014   Terazosin  05/24/2010    Family History  Problem Relation Age of Onset   Hypertension Mother    Heart block Mother    Stroke Mother    Depression Mother    Cancer Father    Heart attack Sister    COPD Sister    Hypertension Brother    Hypertension Brother    Hypertension Brother    Hypertension Brother    Heart attack Brother    Diabetes Neg Hx     Social History   Socioeconomic History   Marital status: Married    Spouse name: Not on file   Number of children: Not on file   Years of education: Not on file   Highest education level: Not on file  Occupational History   Occupation: retired  Tobacco Use   Smoking status: Never   Smokeless tobacco: Never  Advertising account planner  Vaping Use: Never used  Substance and Sexual Activity   Alcohol use: No    Alcohol/week: 0.0 standard drinks of alcohol   Drug use: No   Sexual activity: Not Currently  Other Topics Concern   Not on file  Social History Narrative   Not on file   Social Determinants of Health   Financial Resource Strain: Low Risk  (11/20/2020)   Overall Financial Resource Strain (CARDIA)    Difficulty of Paying Living Expenses: Not hard at all  Food Insecurity: No Food Insecurity (11/20/2020)   Hunger Vital Sign    Worried About Running Out of Food in the Last Year: Never true    Ran Out of Food in the Last Year: Never true  Transportation Needs: No Transportation Needs (11/20/2020)    PRAPARE - Administrator, Civil Service (Medical): No    Lack of Transportation (Non-Medical): No  Physical Activity: Inactive (11/20/2020)   Exercise Vital Sign    Days of Exercise per Week: 0 days    Minutes of Exercise per Session: 0 min  Stress: No Stress Concern Present (11/20/2020)   [employer] of Occupational Health - Occupational Stress Questionnaire    Feeling of Stress : Not at all  Social Connections: Somewhat Isolated (07/21/2017)   Social Connection and Isolation Panel [NHANES]    Frequency of Communication with Friends and Family: Never    Frequency of Social Gatherings with Friends and Family: More than three times a week    Attends Religious Services: Never    Database administrator or Organizations: No    Attends Banker Meetings: Never    Marital Status: Married  Catering manager Violence: Not At Risk (07/21/2017)   Humiliation, Afraid, Rape, and Kick questionnaire    Fear of Current or Ex-Partner: No    Emotionally Abused: No    Physically Abused: No    Sexually Abused: No    Review of Systems: See HPI, otherwise negative ROS  Physical Exam: BP (!) 161/72   Pulse (!) 53   Temp 97.8 F (36.6 C) (Temporal)   Resp 18   Ht 5\' 9"  (1.753 m)   Wt 103.4 kg   SpO2 97%   BMI 33.67 kg/m  General:   Alert,  pleasant and cooperative in NAD Head:  Normocephalic and atraumatic. Lungs:  Clear to auscultation.    Heart:  Regular rate and rhythm.   Impression/Plan: Erik Palmer is here for ophthalmic surgery.  Risks, benefits, limitations, and alternatives regarding ophthalmic surgery have been reviewed with the patient.  Questions have been answered.  All parties agreeable.   Soyla Murphy, MD  10/14/2021, 12:54 PM

## 2021-10-15 ENCOUNTER — Encounter: Payer: Self-pay | Admitting: Ophthalmology

## 2021-10-22 DIAGNOSIS — H2512 Age-related nuclear cataract, left eye: Secondary | ICD-10-CM | POA: Diagnosis not present

## 2021-10-28 NOTE — Discharge Instructions (Signed)

## 2021-10-30 ENCOUNTER — Ambulatory Visit
Admission: RE | Admit: 2021-10-30 | Discharge: 2021-10-30 | Disposition: A | Discharge status: Home / Self Care | Payer: Medicare Other | Attending: Ophthalmology | Admitting: Ophthalmology

## 2021-10-30 ENCOUNTER — Ambulatory Visit (AMBULATORY_SURGERY_CENTER): Payer: Medicare Other | Admitting: Anesthesiology

## 2021-10-30 ENCOUNTER — Ambulatory Visit: Payer: Medicare Other | Admitting: Anesthesiology

## 2021-10-30 ENCOUNTER — Encounter: Payer: Self-pay | Admitting: Ophthalmology

## 2021-10-30 ENCOUNTER — Encounter
Admission: RE | Disposition: A | Discharge status: Home / Self Care | Payer: Self-pay | Source: Home / Self Care | Attending: Ophthalmology

## 2021-10-30 ENCOUNTER — Other Ambulatory Visit: Payer: Self-pay

## 2021-10-30 DIAGNOSIS — I4891 Unspecified atrial fibrillation: Secondary | ICD-10-CM | POA: Insufficient documentation

## 2021-10-30 DIAGNOSIS — Z951 Presence of aortocoronary bypass graft: Secondary | ICD-10-CM | POA: Diagnosis not present

## 2021-10-30 DIAGNOSIS — H21272 Miotic pupillary cyst, left eye: Secondary | ICD-10-CM | POA: Diagnosis not present

## 2021-10-30 DIAGNOSIS — H2512 Age-related nuclear cataract, left eye: Secondary | ICD-10-CM | POA: Insufficient documentation

## 2021-10-30 DIAGNOSIS — F419 Anxiety disorder, unspecified: Secondary | ICD-10-CM

## 2021-10-30 DIAGNOSIS — K219 Gastro-esophageal reflux disease without esophagitis: Secondary | ICD-10-CM | POA: Diagnosis not present

## 2021-10-30 DIAGNOSIS — I10 Essential (primary) hypertension: Secondary | ICD-10-CM | POA: Diagnosis not present

## 2021-10-30 DIAGNOSIS — M1990 Unspecified osteoarthritis, unspecified site: Secondary | ICD-10-CM | POA: Insufficient documentation

## 2021-10-30 DIAGNOSIS — I25119 Atherosclerotic heart disease of native coronary artery with unspecified angina pectoris: Secondary | ICD-10-CM

## 2021-10-30 DIAGNOSIS — H5703 Miosis: Secondary | ICD-10-CM | POA: Insufficient documentation

## 2021-10-30 DIAGNOSIS — E222 Syndrome of inappropriate secretion of antidiuretic hormone: Secondary | ICD-10-CM | POA: Diagnosis not present

## 2021-10-30 DIAGNOSIS — Z79899 Other long term (current) drug therapy: Secondary | ICD-10-CM | POA: Insufficient documentation

## 2021-10-30 DIAGNOSIS — I2510 Atherosclerotic heart disease of native coronary artery without angina pectoris: Secondary | ICD-10-CM | POA: Insufficient documentation

## 2021-10-30 DIAGNOSIS — Z7901 Long term (current) use of anticoagulants: Secondary | ICD-10-CM | POA: Diagnosis not present

## 2021-10-30 HISTORY — PX: CATARACT EXTRACTION W/PHACO: SHX586

## 2021-10-30 SURGERY — PHACOEMULSIFICATION, CATARACT, WITH IOL INSERTION
Anesthesia: Monitor Anesthesia Care | Site: Eye | Laterality: Left

## 2021-10-30 MED ORDER — LACTATED RINGERS IV SOLN: INTRAVENOUS | Status: DC

## 2021-10-30 MED ORDER — BRIMONIDINE TARTRATE-TIMOLOL 0.2-0.5 % OP SOLN
OPHTHALMIC | Status: DC | PRN
  Administered 2021-10-30: 1 [drp] via OPHTHALMIC

## 2021-10-30 MED ORDER — SIGHTPATH DOSE#1 NA HYALUR & NA CHOND-NA HYALUR IO KIT
PACK | INTRAOCULAR | Status: DC | PRN
  Administered 2021-10-30: 1 via OPHTHALMIC

## 2021-10-30 MED ORDER — CEFUROXIME OPHTHALMIC INJECTION 1 MG/0.1 ML
INJECTION | OPHTHALMIC | Status: DC | PRN
  Administered 2021-10-30: 0.1 mL via INTRACAMERAL

## 2021-10-30 MED ORDER — FENTANYL CITRATE (PF) 100 MCG/2ML IJ SOLN
INTRAMUSCULAR | Status: DC | PRN
Start: 2021-10-30 — End: 2021-10-30
  Administered 2021-10-30: 50 ug via INTRAVENOUS

## 2021-10-30 MED ORDER — TETRACAINE HCL 0.5 % OP SOLN
1.0000 [drp] | OPHTHALMIC | Status: AC | PRN
  Administered 2021-10-30 (×3): 1 [drp] via OPHTHALMIC

## 2021-10-30 MED ORDER — MIDAZOLAM HCL 2 MG/2ML IJ SOLN
INTRAMUSCULAR | Status: DC | PRN
  Administered 2021-10-30: 1 mg via INTRAVENOUS

## 2021-10-30 MED ORDER — ARMC OPHTHALMIC DILATING DROPS
1.0000 | OPHTHALMIC | Status: AC | PRN
  Administered 2021-10-30 (×3): 1 via OPHTHALMIC

## 2021-10-30 MED ORDER — NEOMYCIN-POLYMYXIN-DEXAMETH 3.5-10000-0.1 OP OINT
TOPICAL_OINTMENT | OPHTHALMIC | Status: DC | PRN
  Administered 2021-10-30: 1 via OPHTHALMIC

## 2021-10-30 MED ORDER — SIGHTPATH DOSE#1 BSS IO SOLN
INTRAOCULAR | Status: DC | PRN
  Administered 2021-10-30: 89 mL via OPHTHALMIC

## 2021-10-30 MED ORDER — SIGHTPATH DOSE#1 BSS IO SOLN
INTRAOCULAR | Status: DC | PRN
  Administered 2021-10-30: 1 mL via INTRAMUSCULAR

## 2021-10-30 MED ORDER — SIGHTPATH DOSE#1 BSS IO SOLN
INTRAOCULAR | Status: DC | PRN
  Administered 2021-10-30: 15 mL

## 2021-10-30 SURGICAL SUPPLY — 12 items
CATARACT SUITE SIGHTPATH (MISCELLANEOUS) ×1 IMPLANT
FEE CATARACT SUITE SIGHTPATH (MISCELLANEOUS) ×2 IMPLANT
GLOVE SRG 8 PF TXTR STRL LF DI (GLOVE) ×2 IMPLANT
GLOVE SURG ENC TEXT LTX SZ7.5 (GLOVE) ×2 IMPLANT
GLOVE SURG UNDER POLY LF SZ8 (GLOVE) ×1
LENS IOL TECNIS EYHANCE 20.5 (Intraocular Lens) IMPLANT
NDL FILTER BLUNT 18X1 1/2 (NEEDLE) ×2 IMPLANT
NEEDLE FILTER BLUNT 18X 1/2SAF (NEEDLE) ×1
NEEDLE FILTER BLUNT 18X1 1/2 (NEEDLE) ×1 IMPLANT
RING MALYGIN 7.0 (MISCELLANEOUS) IMPLANT
SYR 3ML LL SCALE MARK (SYRINGE) ×2 IMPLANT
WATER STERILE IRR 250ML POUR (IV SOLUTION) ×2 IMPLANT

## 2021-10-30 NOTE — Op Note (Signed)
OPERATIVE NOTE  KITO CUFFE [id number] 10/30/2021  PREOPERATIVE DIAGNOSIS:   Nuclear sclerotic cataract left eye with miotic pupil      H25.12   POSTOPERATIVE DIAGNOSIS:   Nuclear sclerotic cataract left eye with miotic pupil.     PROCEDURE:  Phacoemulsification with posterior chamber intraocular lens implantation of the left eye which required pupil stretching with the Malyugin pupil expansion device  Ultrasound time: Procedure(s): CATARACT EXTRACTION PHACO AND INTRAOCULAR LENS PLACEMENT (IOC) LEFT Malyugin 9.90 01:40.8 (Left)  LENS:   Implant Name Type Inv. Item Serial No. Manufacturer Lot No. LRB No. Used Action  LENS IOL TECNIS EYHANCE 20.5 - [id number] Intraocular Lens LENS IOL TECNIS EYHANCE 20.5 [id number] [employer]  Left 1 Implanted     SURGEON:  Deirdre Evener, MD   ANESTHESIA: Topical with tetracaine drops and 2% Xylocaine jelly, augmented with 1% preservative-free intracameral lidocaine.   COMPLICATIONS:  None.   DESCRIPTION OF PROCEDURE:  The patient was identified in the holding room and transported to the operating room and placed in the supine position under the operating microscope.  The left eye was identified as the operative eye and it was prepped and draped in the usual sterile ophthalmic fashion.   A 1 millimeter clear-corneal paracentesis was made at the 1:30 position.  The anterior chamber was filled with Viscoat viscoelastic.  0.5 ml of preservative-free 1% lidocaine was injected into the anterior chamber.  A 2.4 millimeter keratome was used to make a near-clear corneal incision at the 10:30 position.  A Malyugin pupil expander was then placed through the main incision and into the anterior chamber of the eye.  The edge of the iris was secured on the lip of the pupil expander and it was released, thereby expanding the pupil to approximately 7 millimeters for completion of the cataract surgery.  Additional Viscoat was placed in the anterior chamber.  A  cystotome and capsulorrhexis forceps were used to make a curvilinear capsulorrhexis.   Balanced salt solution was used to hydrodissect and hydrodelineate the lens nucleus.   Phacoemulsification was used in stop and chop fashion to remove the lens, nucleus and epinucleus.  The remaining cortex was aspirated using the irrigation aspiration handpiece.  Additional Provisc was placed into the eye to distend the capsular bag for lens placement.  A lens was then injected into the capsular bag.  The pupil expanding ring was removed using a Kuglen hook and insertion device. The remaining viscoelastic was aspirated from the capsular bag and the anterior chamber.  The anterior chamber was filled with balanced salt solution to inflate to a physiologic pressure.   Wounds were hydrated with balanced salt solution.  The anterior chamber was inflated to a physiologic pressure with balanced salt solution.  No wound leaks were noted. Cefuroxime 0.1 ml of a 10mg /ml solution was injected into the anterior chamber for a dose of 1 mg of intracameral antibiotic at the completion of the case.   Timolol and Brimonidine drops and Maxitrol ointment were applied to the eye.  The patient was taken to the recovery room in stable condition without complications of anesthesia or surgery.  Saurav Crumble 10/30/2021, 8:41 AM

## 2021-10-30 NOTE — Anesthesia Postprocedure Evaluation (Signed)
Anesthesia Post Note  Patient: Kedarius Aloisi Kreuser  Procedure(s) Performed: CATARACT EXTRACTION PHACO AND INTRAOCULAR LENS PLACEMENT (IOC) LEFT Malyugin 9.90 01:40.8 (Left: Eye)     Patient location during evaluation: PACU Anesthesia Type: MAC Level of consciousness: awake and alert Pain management: pain level controlled Vital Signs Assessment: post-procedure vital signs reviewed and stable Respiratory status: spontaneous breathing, nonlabored ventilation, respiratory function stable and patient connected to nasal cannula oxygen Cardiovascular status: stable and blood pressure returned to baseline Postop Assessment: no apparent nausea or vomiting Anesthetic complications: no   There were no known notable events for this encounter.  Martha Clan

## 2021-10-30 NOTE — H&P (Signed)
[facility]   Primary Care Physician:  Smitty Cords, DO Ophthalmologist: Dr. Lockie Mola  Pre-Procedure History & Physical: HPI:  Erik Palmer is a 87 y.o. male here for ophthalmic surgery.   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   Arthritis    GERD (gastroesophageal reflux disease)    Grief at loss of child 09/03/2014   Hypercholesteremia    Hypertension    Prostate enlargement     Past Surgical History:  Procedure Laterality Date   CATARACT EXTRACTION W/PHACO Right 10/14/2021   Procedure: CATARACT EXTRACTION PHACO AND INTRAOCULAR LENS PLACEMENT (IOC) RIGHT 11.27 01:28.4;  Surgeon: Lockie Mola, MD;  Location: [facility];  Service: Ophthalmology;  Laterality: Right;   CORONARY ARTERY BYPASS GRAFT  03/25/2017   at [facility]   TOTAL HIP ARTHROPLASTY Right 12/05/2015   Procedure: TOTAL HIP ARTHROPLASTY ANTERIOR APPROACH;  Surgeon: Kennedy Bucker, MD;  Location: [facility] ORS;  Service: Orthopedics;  Laterality: Right;    Prior to Admission medications   Medication Sig Start Date End Date Taking? Authorizing Provider  acetaminophen (TYLENOL) 650 MG CR tablet Take 500 mg by mouth 2 (two) times daily. Reported on 05/08/2015   Yes [provider]  amLODipine (NORVASC) 5 MG tablet Take 1 tablet (5 mg total) by mouth daily. 08/08/21  Yes Karamalegos, Netta Neat, DO  apixaban (ELIQUIS) 5 MG TABS tablet Take 1 tablet (5 mg total) by mouth 2 (two) times daily. 02/07/21  Yes Karamalegos, Netta Neat, DO  atorvastatin (LIPITOR) 40 MG tablet Take 1 tablet (40 mg total) by mouth daily. 02/07/21  Yes Karamalegos, Netta Neat, DO  carvedilol (COREG) 6.25 MG tablet Take 6.25 mg by mouth 2 (two) times daily with a meal.   Yes [provider]  doxazosin (CARDURA) 2 MG tablet Take 1 tablet (2 mg total) by mouth 2 (two) times daily. 02/07/21  Yes Karamalegos,  Netta Neat, DO  fluticasone (FLONASE) 50 MCG/ACT nasal spray SPRAY 2 SPRAYS INTO EACH NOSTRIL EVERY DAY 07/12/20  Yes Karamalegos, Netta Neat, DO  lisinopril (ZESTRIL) 5 MG tablet Take 2.5 mg by mouth daily. 03/01/20  Yes [provider]  omeprazole (PRILOSEC) 20 MG capsule Take 1 capsule (20 mg total) by mouth 2 (two) times daily. 01/12/21  Yes Karamalegos, Netta Neat, DO  oxybutynin (DITROPAN-XL) 5 MG 24 hr tablet Take 1 tablet (5 mg total) by mouth at bedtime. 08/13/21  Yes Karamalegos, Netta Neat, DO  QUEtiapine (SEROQUEL) 50 MG tablet Take 1 tablet (50 mg total) by mouth at bedtime. 01/14/21  Yes Karamalegos, Netta Neat, DO    Allergies as of 09/04/2021 - Review Complete 08/08/2021  Allergen Reaction Noted   Solu-medrol [methylprednisolone sodium succ] Anxiety 09/12/2014   Terazosin  05/24/2010    Family History  Problem Relation Age of Onset   Hypertension Mother    Heart block Mother    Stroke Mother    Depression Mother    Cancer Father    Heart attack Sister    COPD Sister    Hypertension Brother    Hypertension Brother    Hypertension Brother    Hypertension Brother    Heart attack Brother    Diabetes Neg Hx     Social History   Socioeconomic History   Marital status: Married    Spouse name: Not on file   Number of children: Not on file   Years of  education: Not on file   Highest education level: Not on file  Occupational History   Occupation: retired  Tobacco Use   Smoking status: Never   Smokeless tobacco: Never  Vaping Use   Vaping Use: Never used  Substance and Sexual Activity   Alcohol use: No    Alcohol/week: 0.0 standard drinks of alcohol   Drug use: No   Sexual activity: Not Currently  Other Topics Concern   Not on file  Social History Narrative   Not on file   Social Determinants of Health   Financial Resource Strain: Low Risk  (11/20/2020)   Overall Financial Resource Strain (CARDIA)    Difficulty of Paying Living Expenses:  Not hard at all  Food Insecurity: No Food Insecurity (11/20/2020)   Hunger Vital Sign    Worried About Running Out of Food in the Last Year: Never true    Ran Out of Food in the Last Year: Never true  Transportation Needs: No Transportation Needs (11/20/2020)   PRAPARE - Administrator, Civil Service (Medical): No    Lack of Transportation (Non-Medical): No  Physical Activity: Inactive (11/20/2020)   Exercise Vital Sign    Days of Exercise per Week: 0 days    Minutes of Exercise per Session: 0 min  Stress: No Stress Concern Present (11/20/2020)   [employer] of Occupational Health - Occupational Stress Questionnaire    Feeling of Stress : Not at all  Social Connections: Somewhat Isolated (07/21/2017)   Social Connection and Isolation Panel [NHANES]    Frequency of Communication with Friends and Family: Never    Frequency of Social Gatherings with Friends and Family: More than three times a week    Attends Religious Services: Never    Database administrator or Organizations: No    Attends Banker Meetings: Never    Marital Status: Married  Catering manager Violence: Not At Risk (07/21/2017)   Humiliation, Afraid, Rape, and Kick questionnaire    Fear of Current or Ex-Partner: No    Emotionally Abused: No    Physically Abused: No    Sexually Abused: No    Review of Systems: See HPI, otherwise negative ROS  Physical Exam: There were no vitals taken for this visit. General:   Alert,  pleasant and cooperative in NAD Head:  Normocephalic and atraumatic. Lungs:  Clear to auscultation.    Heart:  Regular rate and rhythm.   Impression/Plan: Erik Palmer is here for ophthalmic surgery.  Risks, benefits, limitations, and alternatives regarding ophthalmic surgery have been reviewed with the patient.  Questions have been answered.  All parties agreeable.   Lockie Mola, MD  10/30/2021, 7:31 AM

## 2021-10-30 NOTE — Transfer of Care (Signed)
Immediate Anesthesia Transfer of Care Note  Patient: Erik Palmer  Procedure(s) Performed: CATARACT EXTRACTION PHACO AND INTRAOCULAR LENS PLACEMENT (IOC) LEFT Malyugin 9.90 01:40.8 (Left: Eye)  Patient Location: PACU  Anesthesia Type: MAC  Level of Consciousness: awake, alert  and patient cooperative  Airway and Oxygen Therapy: Patient Spontanous Breathing and Patient connected to supplemental oxygen  Post-op Assessment: Post-op Vital signs reviewed, Patient's Cardiovascular Status Stable, Respiratory Function Stable, Patent Airway and No signs of Nausea or vomiting  Post-op Vital Signs: Reviewed and stable  Complications: There were no known notable events for this encounter.

## 2021-10-30 NOTE — Anesthesia Preprocedure Evaluation (Signed)
Anesthesia Evaluation  Patient identified by MRN, date of birth, ID band Patient awake    Reviewed: Allergy & Precautions, NPO status , Patient's Chart, lab work & pertinent test results, reviewed documented beta blocker date and time   History of Anesthesia Complications Negative for: history of anesthetic complications  Airway Mallampati: III  TM Distance: >3 FB Neck ROM: full    Dental  (+) Chipped, Poor Dentition   Pulmonary neg pulmonary ROS,    Pulmonary exam normal        Cardiovascular hypertension, Pt. on medications and Pt. on home beta blockers + CAD and + CABG (2019)  + dysrhythmias Atrial Fibrillation  Rhythm:Regular Rate:Normal     Neuro/Psych PSYCHIATRIC DISORDERS Anxiety negative neurological ROS     GI/Hepatic Neg liver ROS, GERD  ,  Endo/Other  H/o SIADH (syndrome of inappropriate ADH production)  Renal/GU      Musculoskeletal  (+) Arthritis ,   Abdominal (+) + obese,   Peds  Hematology negative hematology ROS (+)   Anesthesia Other Findings Past Medical History: No date: Angina pectoris syndrome ([place]) 09/03/2014: Anxiety disorder due to general medical condition No date: Arthritis No date: GERD (gastroesophageal reflux disease) 09/03/2014: Grief at loss of child No date: Hypercholesteremia No date: Hypertension No date: Prostate enlargement  Past Surgical History: 03/25/2017: CORONARY ARTERY BYPASS GRAFT     Comment:  at [facility] 12/05/2015: TOTAL HIP ARTHROPLASTY; Right     Comment:  Procedure: TOTAL HIP ARTHROPLASTY ANTERIOR APPROACH;                Surgeon: Hessie Knows, MD;  Location: [facility];  Service:              Orthopedics;  Laterality: Right;  BMI    Body Mass Index: 33.67 kg/m      Reproductive/Obstetrics negative OB ROS                             Anesthesia Physical  Anesthesia Plan  ASA: 3  Anesthesia Plan: MAC   Post-op Pain Management:  Minimal or no pain anticipated   Induction: Intravenous  PONV Risk Score and Plan: Treatment may vary due to age or medical condition  Airway Management Planned: Natural Airway  Additional Equipment:   Intra-op Plan:   Post-operative Plan:   Informed Consent: I have reviewed the patients History and Physical, chart, labs and discussed the procedure including the risks, benefits and alternatives for the proposed anesthesia with the patient or authorized representative who has indicated his/her understanding and acceptance.       Plan Discussed with: Anesthesiologist, CRNA and Surgeon  Anesthesia Plan Comments:         Anesthesia Quick Evaluation

## 2021-10-31 ENCOUNTER — Encounter: Payer: Self-pay | Admitting: Ophthalmology

## 2021-11-26 ENCOUNTER — Ambulatory Visit: Payer: Medicare Other

## 2021-11-26 DIAGNOSIS — Z961 Presence of intraocular lens: Secondary | ICD-10-CM | POA: Diagnosis not present

## 2021-11-29 ENCOUNTER — Ambulatory Visit: Payer: Medicare Other

## 2021-11-29 DIAGNOSIS — R351 Nocturia: Secondary | ICD-10-CM | POA: Insufficient documentation

## 2021-11-29 DIAGNOSIS — IMO0001 Reserved for inherently not codable concepts without codable children: Secondary | ICD-10-CM | POA: Insufficient documentation

## 2021-11-29 DIAGNOSIS — I10 Essential (primary) hypertension: Secondary | ICD-10-CM | POA: Insufficient documentation

## 2021-11-29 DIAGNOSIS — Z789 Other specified health status: Secondary | ICD-10-CM | POA: Insufficient documentation

## 2021-11-29 DIAGNOSIS — N400 Enlarged prostate without lower urinary tract symptoms: Secondary | ICD-10-CM | POA: Insufficient documentation

## 2021-11-29 DIAGNOSIS — M2550 Pain in unspecified joint: Secondary | ICD-10-CM | POA: Insufficient documentation

## 2021-11-29 DIAGNOSIS — Z87440 Personal history of urinary (tract) infections: Secondary | ICD-10-CM | POA: Insufficient documentation

## 2021-11-29 DIAGNOSIS — Z7901 Long term (current) use of anticoagulants: Secondary | ICD-10-CM | POA: Insufficient documentation

## 2021-11-29 DIAGNOSIS — I2510 Atherosclerotic heart disease of native coronary artery without angina pectoris: Secondary | ICD-10-CM | POA: Insufficient documentation

## 2021-11-29 DIAGNOSIS — T7840XA Allergy, unspecified, initial encounter: Secondary | ICD-10-CM | POA: Insufficient documentation

## 2021-11-29 DIAGNOSIS — H905 Unspecified sensorineural hearing loss: Secondary | ICD-10-CM | POA: Insufficient documentation

## 2021-12-13 ENCOUNTER — Ambulatory Visit (INDEPENDENT_AMBULATORY_CARE_PROVIDER_SITE_OTHER): Payer: Medicare Other

## 2021-12-13 VITALS — Ht 69.0 in | Wt 228.0 lb

## 2021-12-13 DIAGNOSIS — Z0000 Encounter for general adult medical examination without abnormal findings: Secondary | ICD-10-CM

## 2021-12-13 NOTE — Progress Notes (Signed)
I connected with Erik Palmer today by telephone and verified that I am speaking with the correct person using two identifiers. Location patient: home Location provider: work Persons participating in the virtual visit: Erik Palmer, Glenna Durand LPN.   I discussed the limitations, risks, security and privacy concerns of performing an evaluation and management service by telephone and the availability of in person appointments. I also discussed with the patient that there may be a patient responsible charge related to this service. The patient expressed understanding and verbally consented to this telephonic visit.    Interactive audio and video telecommunications were attempted between this provider and patient, however failed, due to patient having technical difficulties OR patient did not have access to video capability.  We continued and completed visit with audio only.     Vital signs may be patient reported or missing.  Subjective:   Erik Palmer is a 87 y.o. male who presents for Medicare Annual/Subsequent preventive examination.  Review of Systems     Cardiac Risk Factors include: advanced age (>32men, >67 women);dyslipidemia;hypertension;male gender;obesity (BMI >30kg/m2)     Objective:    Today's Vitals   12/13/21 1456  Weight: 228 lb (103.4 kg)  Height: 5\' 9"  (1.753 m)   Body mass index is 33.67 kg/m.     12/13/2021    3:00 PM 10/30/2021    7:28 AM 10/14/2021   12:42 PM 11/20/2020    8:21 AM 07/21/2017    8:42 AM 03/26/2017    8:06 PM 10/13/2016    1:57 PM  Advanced Directives  Does Patient Have a Medical Advance Directive? No No Yes Yes No No   Type of Scientist, physiological of [place];Living will [place];Living will     Does patient want to make changes to medical advance directive?  No - Patient declined No - Patient declined      Copy of [place] in Chart?   No - copy requested No - copy requested      Would patient like information on creating a medical advance directive?  No - Patient declined   No - Patient declined       Information is confidential and restricted. Go to Review Flowsheets to unlock data.    Current Medications (verified) Outpatient Encounter Medications as of 12/13/2021  Medication Sig   acetaminophen (TYLENOL) 650 MG CR tablet Take 500 mg by mouth 2 (two) times daily. Reported on 05/08/2015   amLODipine (NORVASC) 5 MG tablet Take 1 tablet (5 mg total) by mouth daily.   apixaban (ELIQUIS) 5 MG TABS tablet Take 1 tablet (5 mg total) by mouth 2 (two) times daily.   atorvastatin (LIPITOR) 40 MG tablet Take 1 tablet (40 mg total) by mouth daily.   carvedilol (COREG) 6.25 MG tablet Take 6.25 mg by mouth 2 (two) times daily with a meal.   doxazosin (CARDURA) 2 MG tablet Take 1 tablet (2 mg total) by mouth 2 (two) times daily.   fluticasone (FLONASE) 50 MCG/ACT nasal spray SPRAY 2 SPRAYS INTO EACH NOSTRIL EVERY DAY   lisinopril (ZESTRIL) 10 MG tablet TAKE ONE-HALF TABLET BY MOUTH EVERY DAY FOR BLOOD PRESSURE   omeprazole (PRILOSEC) 20 MG capsule Take 1 capsule (20 mg total) by mouth 2 (two) times daily.   oxybutynin (DITROPAN-XL) 5 MG 24 hr tablet Take 1 tablet (5 mg total) by mouth at bedtime.   QUEtiapine (SEROQUEL) 50 MG tablet Take 1 tablet (50 mg total) by  mouth at bedtime.   amLODipine (NORVASC) 10 MG tablet TAKE ONE-HALF TABLET BY MOUTH EVERY DAY FOR BLOOD PRESSURE (Patient not taking: Reported on 12/13/2021)   lisinopril (ZESTRIL) 5 MG tablet Take 2.5 mg by mouth daily. (Patient not taking: Reported on 12/13/2021)   No facility-administered encounter medications on file as of 12/13/2021.    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   Arthritis    GERD (gastroesophageal reflux disease)    Grief  at loss of child 09/03/2014   Hypercholesteremia    Hypertension    Prostate enlargement    Past Surgical History:  Procedure Laterality Date   CATARACT EXTRACTION W/PHACO Right 10/14/2021   Procedure: CATARACT EXTRACTION PHACO AND INTRAOCULAR LENS PLACEMENT (IOC) RIGHT 11.27 01:28.4;  Surgeon: Lockie Mola, MD;  Location: [facility];  Service: Ophthalmology;  Laterality: Right;   CATARACT EXTRACTION W/PHACO Left 10/30/2021   Procedure: CATARACT EXTRACTION PHACO AND INTRAOCULAR LENS PLACEMENT (IOC) LEFT [place] 9.90 01:40.8;  Surgeon: Lockie Mola, MD;  Location: [facility];  Service: Ophthalmology;  Laterality: Left;   CORONARY ARTERY BYPASS GRAFT  03/25/2017   at [facility]   TOTAL HIP ARTHROPLASTY Right 12/05/2015   Procedure: TOTAL HIP ARTHROPLASTY ANTERIOR APPROACH;  Surgeon: Kennedy Bucker, MD;  Location: [facility];  Service: Orthopedics;  Laterality: Right;   Family History  Problem Relation Age of Onset   Hypertension Mother    Heart block Mother    Stroke Mother    Depression Mother    Cancer Father    Heart attack Sister    COPD Sister    Hypertension Brother    Hypertension Brother    Hypertension Brother    Hypertension Brother    Heart attack Brother    Diabetes Neg Hx    Social History   Socioeconomic History   Marital status: Married    Spouse name: Not on file   Number of children: Not on file   Years of education: Not on file   Highest education level: Not on file  Occupational History   Occupation: retired  Tobacco Use   Smoking status: Never   Smokeless tobacco: Never  Vaping Use   Vaping Use: Never used  Substance and Sexual Activity   Alcohol use: No    Alcohol/week: 0.0 standard drinks of alcohol   Drug use: No   Sexual activity: Not Currently  Other Topics Concern   Not on file  Social History Narrative   Not on file   Social Determinants of Health   Financial Resource Strain: Low Risk  (12/13/2021)   Overall  Financial Resource Strain (CARDIA)    Difficulty of Paying Living Expenses: Not hard at all  Food Insecurity: No Food Insecurity (12/13/2021)   Hunger Vital Sign    Worried About Running Out of Food in the Last Year: Never true    Ran Out of Food in the Last Year: Never true  Transportation Needs: No Transportation Needs (12/13/2021)   PRAPARE - Administrator, Civil Service (Medical): No    Lack of Transportation (Non-Medical): No  Physical Activity: Inactive (12/13/2021)   Exercise Vital Sign    Days of Exercise per Week: 0 days    Minutes of Exercise per Session: 0 min  Stress: No Stress Concern Present (12/13/2021)   [employer] of Occupational Health - Occupational  Stress Questionnaire    Feeling of Stress : Not at all  Social Connections: Somewhat Isolated (07/21/2017)   Social Connection and Isolation Panel [NHANES]    Frequency of Communication with Friends and Family: Never    Frequency of Social Gatherings with Friends and Family: More than three times a week    Attends Religious Services: Never    Database administrator or Organizations: No    Attends Engineer, structural: Never    Marital Status: Married    Tobacco Counseling Counseling given: Not Answered   Clinical Intake:  Pre-visit preparation completed: Yes  Pain : No/denies pain     Nutritional Status: BMI > 30  Obese Nutritional Risks: None Diabetes: No  How often do you need to have someone help you when you read instructions, pamphlets, or other written materials from your doctor or pharmacy?: 1 - Never  Diabetic? no  Interpreter Needed?: No  Information entered by :: NAllen LPN   Activities of Daily Living    12/13/2021    3:00 PM 10/30/2021    7:35 AM  In your present state of health, do you have any difficulty performing the following activities:  Hearing? 0 1  Vision? 0 1  Difficulty concentrating or making decisions? 0 0  Walking or climbing stairs? 0 0   Dressing or bathing? 0 0  Doing errands, shopping? 0   Preparing Food and eating ? N   Using the Toilet? N   In the past six months, have you accidently leaked urine? Y   Do you have problems with loss of bowel control? N   Managing your Medications? N   Managing your Finances? N   Housekeeping or managing your Housekeeping? N     Patient Care Team: Smitty Cords, DO as PCP - General (Family Medicine)  Indicate any recent Medical Services you may have received from other than [facility] providers in the past year (date may be approximate).     Assessment:   This is a routine wellness examination for Zahir.  Hearing/Vision screen Vision Screening - Comments:: Regular eye exams, [facility]  Dietary issues and exercise activities discussed: Current Exercise Habits: The patient does not participate in regular exercise at present   Goals Addressed             This Visit's Progress    Patient Stated       12/13/2021, no goals       Depression Screen    12/13/2021    3:00 PM 08/08/2021   10:46 AM 02/07/2021    1:20 PM 11/20/2020    8:22 AM 08/03/2020    8:41 AM 01/19/2020   11:36 AM 07/19/2019   11:08 AM  PHQ 2/9 Scores  PHQ - 2 Score 0 1 0 0 0 0 0  PHQ- 9 Score  5 0  0 1 1    Fall Risk    12/13/2021    3:00 PM 02/07/2021    8:41 AM 11/20/2020    8:22 AM 08/03/2020    8:41 AM 07/19/2019   11:09 AM  Fall Risk   Falls in the past year? 0 0 0 0 0  Number falls in past yr: 0 0  0 0  Injury with Fall? 0 0  0 0  Risk for fall due to : Medication side effect  Medication side effect    Follow up Falls prevention discussed;Education provided;Falls evaluation completed Falls evaluation completed Falls evaluation completed;Education provided;Falls  prevention discussed Falls evaluation completed Falls evaluation completed    FALL RISK PREVENTION PERTAINING TO THE HOME:  Any stairs in or around the home? Yes  If so, are there any without handrails? No  Home  free of loose throw rugs in walkways, pet beds, electrical cords, etc? Yes  Adequate lighting in your home to reduce risk of falls? Yes   ASSISTIVE DEVICES UTILIZED TO PREVENT FALLS:  Life alert? No  Use of a cane, walker or w/c? No  Grab bars in the bathroom? Yes  Shower chair or bench in shower? Yes  Elevated toilet seat or a handicapped toilet? No   TIMED UP AND GO:  Was the test performed? No .      Cognitive Function:    12/22/2014    8:59 AM  MMSE - Mini Mental State Exam  Orientation to time 5  Orientation to Place 5  Registration 3  Attention/ Calculation 5  Recall 3  Language- name 2 objects 2  Language- repeat 1  Language- follow 3 step command 3  Language- read & follow direction 1  Write a sentence 1  Copy design 1  Total score 30        12/13/2021    3:02 PM 11/20/2020    8:24 AM 07/21/2017    8:43 AM  6CIT Screen  What Year? 0 points 0 points 0 points  What month? 0 points 0 points 0 points  What time? 0 points 0 points 0 points  Count back from 20 0 points 0 points 0 points  Months in reverse 0 points 0 points 0 points  Repeat phrase 6 points 4 points 0 points  Total Score 6 points 4 points 0 points    Immunizations Immunization History  Administered Date(s) Administered   Influenza-Unspecified 12/30/2002, 01/18/2004, 03/14/2005, 04/25/2005, 01/08/2006, 02/05/2007   Moderna Sars-Covid-2 Vaccination 04/09/2019   PFIZER(Purple Top)SARS-COV-2 Vaccination 04/29/2019, 05/31/2019, 02/07/2020   Pneumococcal Conjugate-13 06/20/2014   Pneumococcal Polysaccharide-23 05/26/2002   Td 08/01/2005   Td (Adult),unspecified 08/01/2005   Tdap 04/06/2012, 06/20/2014    TDAP status: Up to date  Flu Vaccine status: Declined, Education has been provided regarding the importance of this vaccine but patient still declined. Advised may receive this vaccine at local pharmacy or Health Dept. Aware to provide a copy of the vaccination record if obtained from local  pharmacy or Health Dept. Verbalized acceptance and understanding.  Pneumococcal vaccine status: Up to date  Covid-19 vaccine status: Completed vaccines  Qualifies for Shingles Vaccine? Yes   Zostavax completed No   Shingrix Completed?: No.    Education has been provided regarding the importance of this vaccine. Patient has been advised to call insurance company to determine out of pocket expense if they have not yet received this vaccine. Advised may also receive vaccine at local pharmacy or Health Dept. Verbalized acceptance and understanding.  Screening Tests Health Maintenance  Topic Date Due   Zoster Vaccines- Shingrix (1 of 2) Never done   COVID-19 Vaccine (5 - Mixed Product series) 04/03/2020   INFLUENZA VACCINE  10/01/2021   TETANUS/TDAP  06/19/2024   Pneumonia Vaccine 10+ Years old  Completed   HPV VACCINES  Aged Out    Health Maintenance  Health Maintenance Due  Topic Date Due   Zoster Vaccines- Shingrix (1 of 2) Never done   COVID-19 Vaccine (5 - Mixed Product series) 04/03/2020   INFLUENZA VACCINE  10/01/2021    Colorectal cancer screening: No longer required.   Lung Cancer  Screening: (Low Dose CT Chest recommended if Age 35-80 years, 30 pack-year currently smoking OR have quit w/in 15years.) does not qualify.   Lung Cancer Screening Referral: no  Additional Screening:  Hepatitis C Screening: does not qualify;   Vision Screening: Recommended annual ophthalmology exams for early detection of glaucoma and other disorders of the eye. Is the patient up to date with their annual eye exam?  Yes  Who is the provider or what is the name of the office in which the patient attends annual eye exams? [facility] If pt is not established with a provider, would they like to be referred to a provider to establish care? No .   Dental Screening: Recommended annual dental exams for proper oral hygiene  Community Resource Referral / Chronic Care Management: CRR  required this visit?  No   CCM required this visit?  No      Plan:     I have personally reviewed and noted the following in the patient's chart:   Medical and social history Use of alcohol, tobacco or illicit drugs  Current medications and supplements including opioid prescriptions. Patient is not currently taking opioid prescriptions. Functional ability and status Nutritional status Physical activity Advanced directives List of other physicians Hospitalizations, surgeries, and ER visits in previous 12 months Vitals Screenings to include cognitive, depression, and falls Referrals and appointments  In addition, I have reviewed and discussed with patient certain preventive protocols, quality metrics, and best practice recommendations. A written personalized care plan for preventive services as well as general preventive health recommendations were provided to patient.     Barb Merinoickeah E Pegi Milazzo, LPN   [id number]/10/960410/13/2023   Nurse Notes: none  Due to this being a virtual visit, the after visit summary with patients personalized plan was offered to patient via mail or my-chart.  to pick up at office at next visit

## 2021-12-13 NOTE — Patient Instructions (Signed)
Erik Palmer , Thank you for taking time to come for your Medicare Wellness Visit. I appreciate your ongoing commitment to your health goals. Please review the following plan we discussed and let me know if I can assist you in the future.   Screening recommendations/referrals: Colonoscopy: not required Recommended yearly ophthalmology/optometry visit for glaucoma screening and checkup Recommended yearly dental visit for hygiene and checkup  Vaccinations: Influenza vaccine: decline Pneumococcal vaccine: completed 06/20/2014 Tdap vaccine: completed 06/20/2014, due 06/19/2024 Shingles vaccine: decline   Covid-19:  02/07/2020, 05/31/2019, 04/29/2019, 04/09/2019  Advanced directives: Advance directive discussed with you today.   Conditions/risks identified: none  Next appointment: Follow up in one year for your annual wellness visit.   Preventive Care 85 Years and Older, Male Preventive care refers to lifestyle choices and visits with your health care provider that can promote health and wellness. What does preventive care include? A yearly physical exam. This is also called an annual well check. Dental exams once or twice a year. Routine eye exams. Ask your health care provider how often you should have your eyes checked. Personal lifestyle choices, including: Daily care of your teeth and gums. Regular physical activity. Eating a healthy diet. Avoiding tobacco and drug use. Limiting alcohol use. Practicing safe sex. Taking low doses of aspirin every day. Taking vitamin and mineral supplements as recommended by your health care provider. What happens during an annual well check? The services and screenings done by your health care provider during your annual well check will depend on your age, overall health, lifestyle risk factors, and family history of disease. Counseling  Your health care provider may ask you questions about your: Alcohol use. Tobacco use. Drug use. Emotional  well-being. Home and relationship well-being. Sexual activity. Eating habits. History of falls. Memory and ability to understand (cognition). Work and work Statistician. Screening  You may have the following tests or measurements: Height, weight, and BMI. Blood pressure. Lipid and cholesterol levels. These may be checked every 5 years, or more frequently if you are over 70 years old. Skin check. Lung cancer screening. You may have this screening every year starting at age 85 if you have a 30-pack-year history of smoking and currently smoke or have quit within the past 15 years. Fecal occult blood test (FOBT) of the stool. You may have this test every year starting at age 85. Flexible sigmoidoscopy or colonoscopy. You may have a sigmoidoscopy every 5 years or a colonoscopy every 10 years starting at age 85. Prostate cancer screening. Recommendations will vary depending on your family history and other risks. Hepatitis C blood test. Hepatitis B blood test. Sexually transmitted disease (STD) testing. Diabetes screening. This is done by checking your blood sugar (glucose) after you have not eaten for a while (fasting). You may have this done every 1-3 years. Abdominal aortic aneurysm (AAA) screening. You may need this if you are a current or former smoker. Osteoporosis. You may be screened starting at age 85 if you are at high risk. Talk with your health care provider about your test results, treatment options, and if necessary, the need for more tests. Vaccines  Your health care provider may recommend certain vaccines, such as: Influenza vaccine. This is recommended every year. Tetanus, diphtheria, and acellular pertussis (Tdap, Td) vaccine. You may need a Td booster every 10 years. Zoster vaccine. You may need this after age 85. Pneumococcal 13-valent conjugate (PCV13) vaccine. One dose is recommended after age 85. Pneumococcal polysaccharide (PPSV23) vaccine. One dose is recommended  after  age 85. Talk to your health care provider about which screenings and vaccines you need and how often you need them. This information is not intended to replace advice given to you by your health care provider. Make sure you discuss any questions you have with your health care provider. Document Released: 03/16/2015 Document Revised: 11/07/2015 Document Reviewed: 12/19/2014 Elsevier Interactive Patient Education  2017 [employer] Prevention in the Home Falls can cause injuries. They can happen to people of all ages. There are many things you can do to make your home safe and to help prevent falls. What can I do on the outside of my home? Regularly fix the edges of walkways and driveways and fix any cracks. Remove anything that might make you trip as you walk through a door, such as a raised step or threshold. Trim any bushes or trees on the path to your home. Use bright outdoor lighting. Clear any walking paths of anything that might make someone trip, such as rocks or tools. Regularly check to see if handrails are loose or broken. Make sure that both sides of any steps have handrails. Any raised decks and porches should have guardrails on the edges. Have any leaves, snow, or ice cleared regularly. Use sand or salt on walking paths during winter. Clean up any spills in your garage right away. This includes oil or grease spills. What can I do in the bathroom? Use night lights. Install grab bars by the toilet and in the tub and shower. Do not use towel bars as grab bars. Use non-skid mats or decals in the tub or shower. If you need to sit down in the shower, use a plastic, non-slip stool. Keep the floor dry. Clean up any water that spills on the floor as soon as it happens. Remove soap buildup in the tub or shower regularly. Attach bath mats securely with double-sided non-slip rug tape. Do not have throw rugs and other things on the floor that can make you trip. What can I do in the  bedroom? Use night lights. Make sure that you have a light by your bed that is easy to reach. Do not use any sheets or blankets that are too big for your bed. They should not hang down onto the floor. Have a firm chair that has side arms. You can use this for support while you get dressed. Do not have throw rugs and other things on the floor that can make you trip. What can I do in the kitchen? Clean up any spills right away. Avoid walking on wet floors. Keep items that you use a lot in easy-to-reach places. If you need to reach something above you, use a strong step stool that has a grab bar. Keep electrical cords out of the way. Do not use floor polish or wax that makes floors slippery. If you must use wax, use non-skid floor wax. Do not have throw rugs and other things on the floor that can make you trip. What can I do with my stairs? Do not leave any items on the stairs. Make sure that there are handrails on both sides of the stairs and use them. Fix handrails that are broken or loose. Make sure that handrails are as long as the stairways. Check any carpeting to make sure that it is firmly attached to the stairs. Fix any carpet that is loose or worn. Avoid having throw rugs at the top or bottom of the stairs. If you  do have throw rugs, attach them to the floor with carpet tape. Make sure that you have a light switch at the top of the stairs and the bottom of the stairs. If you do not have them, ask someone to add them for you. What else can I do to help prevent falls? Wear shoes that: Do not have high heels. Have rubber bottoms. Are comfortable and fit you well. Are closed at the toe. Do not wear sandals. If you use a stepladder: Make sure that it is fully opened. Do not climb a closed stepladder. Make sure that both sides of the stepladder are locked into place. Ask someone to hold it for you, if possible. Clearly mark and make sure that you can see: Any grab bars or  handrails. First and last steps. Where the edge of each step is. Use tools that help you move around (mobility aids) if they are needed. These include: Canes. Walkers. Scooters. Crutches. Turn on the lights when you go into a dark area. Replace any light bulbs as soon as they burn out. Set up your furniture so you have a clear path. Avoid moving your furniture around. If any of your floors are uneven, fix them. If there are any pets around you, be aware of where they are. Review your medicines with your doctor. Some medicines can make you feel dizzy. This can increase your chance of falling. Ask your doctor what other things that you can do to help prevent falls. This information is not intended to replace advice given to you by your health care provider. Make sure you discuss any questions you have with your health care provider. Document Released: 12/14/2008 Document Revised: 07/26/2015 Document Reviewed: 03/24/2014 Elsevier Interactive Patient Education  2017 [employer].

## 2022-01-22 ENCOUNTER — Other Ambulatory Visit: Payer: Self-pay | Admitting: Family Medicine

## 2022-01-22 DIAGNOSIS — K219 Gastro-esophageal reflux disease without esophagitis: Secondary | ICD-10-CM

## 2022-01-22 NOTE — Telephone Encounter (Signed)
Requested Prescriptions  Pending Prescriptions Disp Refills   omeprazole (PRILOSEC) 20 MG capsule [Pharmacy Med Name: OMEPRAZOLE DR 20 MG CAPSULE] 180 capsule 2    Sig: TAKE 1 CAPSULE BY MOUTH TWICE A DAY     Gastroenterology: Proton Pump Inhibitors Passed - 01/22/2022  1:59 AM      Passed - Valid encounter within last 12 months    Recent Outpatient Visits           5 months ago Essential hypertension   [facility] Smitty Cords, DO   11 months ago BPH with obstruction/lower urinary tract symptoms   [facility] Smitty Cords, DO   1 year ago Acute cystitis with hematuria   [facility] Smitty Cords, DO   2 years ago Annual physical exam   [facility] Smitty Cords, DO   2 years ago Screening for prostate cancer   [facility] Smitty Cords, DO       Future Appointments             In 3 weeks Althea Charon, Netta Neat, DO [facility], [facility]

## 2022-02-05 ENCOUNTER — Other Ambulatory Visit: Payer: Self-pay

## 2022-02-05 DIAGNOSIS — Z0000 Encounter for general adult medical examination without abnormal findings: Secondary | ICD-10-CM

## 2022-02-05 DIAGNOSIS — E782 Mixed hyperlipidemia: Secondary | ICD-10-CM

## 2022-02-05 DIAGNOSIS — R972 Elevated prostate specific antigen [PSA]: Secondary | ICD-10-CM

## 2022-02-05 DIAGNOSIS — I10 Essential (primary) hypertension: Secondary | ICD-10-CM

## 2022-02-05 DIAGNOSIS — R7309 Other abnormal glucose: Secondary | ICD-10-CM

## 2022-02-05 DIAGNOSIS — E669 Obesity, unspecified: Secondary | ICD-10-CM

## 2022-02-06 ENCOUNTER — Other Ambulatory Visit: Payer: Medicare Other

## 2022-02-06 DIAGNOSIS — I10 Essential (primary) hypertension: Secondary | ICD-10-CM | POA: Diagnosis not present

## 2022-02-06 DIAGNOSIS — R7309 Other abnormal glucose: Secondary | ICD-10-CM | POA: Diagnosis not present

## 2022-02-06 DIAGNOSIS — E782 Mixed hyperlipidemia: Secondary | ICD-10-CM | POA: Diagnosis not present

## 2022-02-07 LAB — COMPREHENSIVE METABOLIC PANEL
AG Ratio: 1.9 (calc) (ref 1.0–2.5)
ALT: 12 U/L (ref 9–46)
AST: 14 U/L (ref 10–35)
Albumin: 4.4 g/dL (ref 3.6–5.1)
Alkaline phosphatase (APISO): 60 U/L (ref 35–144)
BUN: 11 mg/dL (ref 7–25)
CO2: 28 mmol/L (ref 20–32)
Calcium: 9.3 mg/dL (ref 8.6–10.3)
Chloride: 104 mmol/L (ref 98–110)
Creat: 0.75 mg/dL (ref 0.70–1.22)
Globulin: 2.3 g/dL (calc) (ref 1.9–3.7)
Glucose, Bld: 90 mg/dL (ref 65–99)
Potassium: 4 mmol/L (ref 3.5–5.3)
Sodium: 142 mmol/L (ref 135–146)
Total Bilirubin: 0.5 mg/dL (ref 0.2–1.2)
Total Protein: 6.7 g/dL (ref 6.1–8.1)

## 2022-02-07 LAB — CBC WITH DIFFERENTIAL/PLATELET
Absolute Monocytes: 519 cells/uL (ref 200–950)
Basophils Absolute: 11 cells/uL (ref 0–200)
Basophils Relative: 0.2 %
Eosinophils Absolute: 138 cells/uL (ref 15–500)
Eosinophils Relative: 2.6 %
HCT: 35.6 % — ABNORMAL LOW (ref 38.5–50.0)
Hemoglobin: 12 g/dL — ABNORMAL LOW (ref 13.2–17.1)
Lymphs Abs: 1860 cells/uL (ref 850–3900)
MCH: 31.1 pg (ref 27.0–33.0)
MCHC: 33.7 g/dL (ref 32.0–36.0)
MCV: 92.2 fL (ref 80.0–100.0)
MPV: 10.5 fL (ref 7.5–12.5)
Monocytes Relative: 9.8 %
Neutro Abs: 2772 cells/uL (ref 1500–7800)
Neutrophils Relative %: 52.3 %
Platelets: 272 10*3/uL (ref 140–400)
RBC: 3.86 10*6/uL — ABNORMAL LOW (ref 4.20–5.80)
RDW: 13.3 % (ref 11.0–15.0)
Total Lymphocyte: 35.1 %
WBC: 5.3 10*3/uL (ref 3.8–10.8)

## 2022-02-07 LAB — LIPID PANEL
Cholesterol: 159 mg/dL (ref <200)
HDL: 52 mg/dL (ref >=40)
LDL Cholesterol (Calc): 83 mg/dL (calc)
Non-HDL Cholesterol (Calc): 107 mg/dL (calc) (ref <130)
Total CHOL/HDL Ratio: 3.1 (calc) (ref <5.0)
Triglycerides: 139 mg/dL (ref <150)

## 2022-02-07 LAB — HEMOGLOBIN A1C
Hgb A1c MFr Bld: 5.8 % of total Hgb — ABNORMAL HIGH (ref <5.7)
Mean Plasma Glucose: 120 mg/dL
eAG (mmol/L): 6.6 mmol/L

## 2022-02-07 LAB — PSA: PSA: 2.34 ng/mL (ref <=4.00)

## 2022-02-13 ENCOUNTER — Encounter: Payer: Self-pay | Admitting: Family Medicine

## 2022-02-13 ENCOUNTER — Ambulatory Visit (INDEPENDENT_AMBULATORY_CARE_PROVIDER_SITE_OTHER): Payer: Medicare Other | Admitting: Family Medicine

## 2022-02-13 ENCOUNTER — Other Ambulatory Visit: Payer: Self-pay | Admitting: Family Medicine

## 2022-02-13 VITALS — BP 134/70 | HR 62 | Ht 69.0 in | Wt 229.0 lb

## 2022-02-13 DIAGNOSIS — I4821 Permanent atrial fibrillation: Secondary | ICD-10-CM | POA: Diagnosis not present

## 2022-02-13 DIAGNOSIS — Z0000 Encounter for general adult medical examination without abnormal findings: Secondary | ICD-10-CM

## 2022-02-13 DIAGNOSIS — E782 Mixed hyperlipidemia: Secondary | ICD-10-CM | POA: Diagnosis not present

## 2022-02-13 DIAGNOSIS — E222 Syndrome of inappropriate secretion of antidiuretic hormone: Secondary | ICD-10-CM | POA: Diagnosis not present

## 2022-02-13 DIAGNOSIS — R7309 Other abnormal glucose: Secondary | ICD-10-CM

## 2022-02-13 DIAGNOSIS — F3342 Major depressive disorder, recurrent, in full remission: Secondary | ICD-10-CM

## 2022-02-13 DIAGNOSIS — I10 Essential (primary) hypertension: Secondary | ICD-10-CM

## 2022-02-13 DIAGNOSIS — N138 Other obstructive and reflux uropathy: Secondary | ICD-10-CM | POA: Diagnosis not present

## 2022-02-13 DIAGNOSIS — N401 Enlarged prostate with lower urinary tract symptoms: Secondary | ICD-10-CM

## 2022-02-13 DIAGNOSIS — F5104 Psychophysiologic insomnia: Secondary | ICD-10-CM

## 2022-02-13 MED ORDER — QUETIAPINE FUMARATE 50 MG PO TABS: 50.0000 mg | ORAL_TABLET | Freq: Every day | ORAL | 3 refills | Status: DC

## 2022-02-13 MED ORDER — DOXAZOSIN MESYLATE 2 MG PO TABS: 2.0000 mg | ORAL_TABLET | Freq: Two times a day (BID) | ORAL | 3 refills | Status: DC

## 2022-02-13 MED ORDER — ATORVASTATIN CALCIUM 40 MG PO TABS: 40.0000 mg | ORAL_TABLET | Freq: Every day | ORAL | 3 refills | Status: DC

## 2022-02-13 NOTE — Assessment & Plan Note (Signed)
Stable chronic BPH with LUTS primarily nocturia Improved PSA 2 range now - [facility] - No known personal/family history of prostate CA - fam history of brother with prostate biopsy/BPH Failed Myrbetriq for possible OAB due to cost  Followed by [facility] Off Myrbetriq, Oxybutynin XL, Solifenacin  Plan: 1. Continue current dose Doxazosin 2mg  BID for now - cannot tolerate higher dose up to 4mg  per  Advised him to return to [facility] for further PSA monitor and BPH treatment

## 2022-02-13 NOTE — Assessment & Plan Note (Signed)
Controlled BP currently No known complications Failed HCTZ (hyponatremia)  Plan: 1. Continue Amlodipine 5mg  daily, Carvedilol 6.25mg  BID, Doxazosin 2mg  BID 2. Closely watch BP at home, monitor regularly, notify office if elevated BP >140/90 persistently - discussion on goal BP < 150, despite patient not interested in lower BP again as he experienced in past, he is comfortable with lesser control and understands risks 4. Encouraged will need to work on gradual improving exercise, continue hydration, low sodium diet 5. Follow-up 6 months

## 2022-02-13 NOTE — Assessment & Plan Note (Signed)
Chronic Atrial Fibrillation On Anticoagulation Followed by Cardiology

## 2022-02-13 NOTE — Assessment & Plan Note (Signed)
Chronic insomnia, previously managed by [facility] PHQ negative today Maintained on Seroquel 50mg  nightly

## 2022-02-13 NOTE — Assessment & Plan Note (Signed)
Remission On Seroquel No longer w/ Psych  Note with wife's passing recently he has had some down mood but not admitting depression. He has family support system

## 2022-02-13 NOTE — Patient Instructions (Addendum)
Thank you for coming to the office today.  Keep OFF of the Oxybutynin or Solifenacin. If you are doing well OFF of medication that is fine.  Hemoglobin has improved Recommend more greens, liver etc iron rich diet. But you do not need any iron pills  Recent Labs    02/06/22 0804  HGBA1C 5.8*   Blood sugar average is controlled. And maintained.  Kidney and liver look good.  DUE for FASTING BLOOD WORK (no food or drink after midnight before the lab appointment, only water or coffee without cream/sugar on the morning of)  SCHEDULE "Lab Only" visit in the morning at the clinic for lab draw in 6 MONTHS   - Make sure Lab Only appointment is at about 1 week before your next appointment, so that results will be available  For Lab Results, once available within 2-3 days of blood draw, you can can log in to MyChart online to view your results and a brief explanation. Also, we can discuss results at next follow-up visit.   Please schedule a Follow-up Appointment to: Return in about 6 months (around 08/15/2022) for 6 month fasting lab only then 1 week later Follow-up HTN, HLD OAB updates.  If you have any other questions or concerns, please feel free to call the office or send a message through MyChart. You may also schedule an earlier appointment if necessary.  Additionally, you may be receiving a survey about your experience at our office within a few days to 1 week by e-mail or mail. We value your feedback.  Saralyn Pilar, DO [facility], [place]

## 2022-02-13 NOTE — Assessment & Plan Note (Addendum)
Stable controlled A1c 5.8 Without worsening Encourage lifestyle intervention

## 2022-02-13 NOTE — Progress Notes (Signed)
Subjective:    Patient ID: Erik Palmer, male    DOB: 03-15-1934, 87 y.o.   MRN: [id number]  Erik Palmer is a 87 y.o. male presenting on 02/13/2022 for Annual Exam   HPI  CHRONIC HTN: Reports elevated BP here, and home readings avg 130/70s Current Meds - Amlodipine 5mg  daily, Carvedilol 6.25mg  BID, Doxazosin 2mg  BID, Lisinopril 5mg  daily  (half of 10mg ) Reports good compliance, took meds today. Tolerating well, w/o complaints. Denies CP, dyspnea, HA, edema, dizziness / lightheadedness   Elevated A1c A1c lab result 5.8, stable from previous A1c average 5.6 to 5.7 CBGs: not checking CBG regularly Meds: never on med Currently not on ACEi / ARB Lifestyle: - Diet (improved diet)  - Exercise (limited but he is improving exercise) Denies hypoglycemia  HYPERLIPIDEMIA: - Reports no concerns. Last lipid panel 01/2022, controlled results lab with LDL 83, prior 91-96, and TG 139 prior 150+ - Currently taking Atorvastatin 40mg , tolerating well without side effects or myalgias    PMH - Cardiology / Permanent Atrial Fibrillation / HTN    BPH with LUTS Gross Hematuria [facility] and he is not retaining on PVR He saw Urologist they discussed OAB symptoms [facility] prescribed Solifenacin, and he stopped Oxybutynin and tried the other and he did worse, took 1 and felt terrible. He discontinued. Then he stopped the Oxybutynin as well and now overall feels. Doing well currently OFF meds   Major Depression recurrent mild vs remission / Insomnia Taking Seroquel 50mg  nightly currently and doing well Wife died in 11/05/23he has had some sad mood but overall not worsening depression. He is managing with family support system  Handicap placard   Health Maintenance:   Elevated PSA / Prostate CA Screening: PSA trend now shows PSA 2.34 (01/2022), prior range 2.58 - 3.02 - 4.12 in past few years      02/13/2022   10:37 AM 12/13/2021    3:00 PM 08/08/2021    10:46 AM  Depression screen PHQ 2/9  Decreased Interest 0 0 1  Down, Depressed, Hopeless 0 0 0  PHQ - 2 Score 0 0 1  Altered sleeping 0  0  Tired, decreased energy 0  0  Change in appetite 0  1  Feeling bad or failure about yourself  0  0  Trouble concentrating 0  3  Moving slowly or fidgety/restless 0  0  Suicidal thoughts 0  0  PHQ-9 Score 0  5  Difficult doing work/chores Not difficult at all      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   Arthritis    GERD (gastroesophageal reflux disease)    Grief at loss of child 09/03/2014   Hypercholesteremia    Hypertension    Prostate enlargement    Past Surgical History:  Procedure Laterality Date   CATARACT EXTRACTION W/PHACO Right 10/14/2021   Procedure: CATARACT EXTRACTION PHACO AND INTRAOCULAR LENS PLACEMENT (IOC) RIGHT 11.27 01:28.4;  Surgeon: 12/15/2021, MD;  Location: [facility];  Service: Ophthalmology;  Laterality: Right;   CATARACT EXTRACTION W/PHACO Left 10/30/2021   Procedure: CATARACT EXTRACTION PHACO AND INTRAOCULAR LENS PLACEMENT (IOC) LEFT M[place] 9.90 01:40.8;  Surgeon: 11/04/2014, MD;  Location: [facility];  Service: Ophthalmology;  Laterality: Left;   CORONARY ARTERY BYPASS GRAFT  03/25/2017   at [facility]   TOTAL HIP ARTHROPLASTY Right 12/05/2015  Procedure: TOTAL HIP ARTHROPLASTY ANTERIOR APPROACH;  Surgeon: Kennedy Bucker, MD;  Location: [facility];  Service: Orthopedics;  Laterality: Right;   Social History   Socioeconomic History   Marital status: Married    Spouse name: Not on file   Number of children: Not on file   Years of education: Not on file   Highest education level: Not on file  Occupational History   Occupation: retired  Tobacco Use   Smoking status: Never   Smokeless tobacco: Never  Vaping Use   Vaping Use: Never used  Substance and Sexual Activity   Alcohol use: No     Alcohol/week: 0.0 standard drinks of alcohol   Drug use: No   Sexual activity: Not Currently  Other Topics Concern   Not on file  Social History Narrative   Not on file   Social Determinants of Health   Financial Resource Strain: Low Risk  (12/13/2021)   Overall Financial Resource Strain (CARDIA)    Difficulty of Paying Living Expenses: Not hard at all  Food Insecurity: No Food Insecurity (12/13/2021)   Hunger Vital Sign    Worried About Running Out of Food in the Last Year: Never true    Ran Out of Food in the Last Year: Never true  Transportation Needs: No Transportation Needs (12/13/2021)   PRAPARE - Administrator, Civil Service (Medical): No    Lack of Transportation (Non-Medical): No  Physical Activity: Inactive (12/13/2021)   Exercise Vital Sign    Days of Exercise per Week: 0 days    Minutes of Exercise per Session: 0 min  Stress: No Stress Concern Present (12/13/2021)   [employer] of Occupational Health - Occupational Stress Questionnaire    Feeling of Stress : Not at all  Social Connections: Somewhat Isolated (07/21/2017)   Social Connection and Isolation Panel [NHANES]    Frequency of Communication with Friends and Family: Never    Frequency of Social Gatherings with Friends and Family: More than three times a week    Attends Religious Services: Never    Database administrator or Organizations: No    Attends Banker Meetings: Never    Marital Status: Married  Catering manager Violence: Not At Risk (07/21/2017)   Humiliation, Afraid, Rape, and Kick questionnaire    Fear of Current or Ex-Partner: No    Emotionally Abused: No    Physically Abused: No    Sexually Abused: No   Family History  Problem Relation Age of Onset   Hypertension Mother    Heart block Mother    Stroke Mother    Depression Mother    Cancer Father    Heart attack Sister    COPD Sister    Hypertension Brother    Hypertension Brother    Hypertension Brother     Hypertension Brother    Heart attack Brother    Diabetes Neg Hx    Current Outpatient Medications on File Prior to Visit  Medication Sig   acetaminophen (TYLENOL) 650 MG CR tablet Take 500 mg by mouth 2 (two) times daily. Reported on 05/08/2015   amLODipine (NORVASC) 5 MG tablet Take 1 tablet (5 mg total) by mouth daily.   apixaban (ELIQUIS) 5 MG TABS tablet Take 1 tablet (5 mg total) by mouth 2 (two) times daily.   carvedilol (COREG) 6.25 MG tablet Take 6.25 mg by mouth 2 (two) times daily with a meal.   fluticasone (FLONASE) 50 MCG/ACT nasal spray SPRAY 2 SPRAYS  INTO EACH NOSTRIL EVERY DAY   lisinopril (ZESTRIL) 10 MG tablet TAKE ONE-HALF TABLET BY MOUTH EVERY DAY FOR BLOOD PRESSURE   omeprazole (PRILOSEC) 20 MG capsule TAKE 1 CAPSULE BY MOUTH TWICE A DAY   No current facility-administered medications on file prior to visit.    Review of Systems  Constitutional:  Negative for activity change, appetite change, chills, diaphoresis, fatigue and fever.  HENT:  Negative for congestion and hearing loss.   Eyes:  Negative for visual disturbance.  Respiratory:  Negative for cough, chest tightness, shortness of breath and wheezing.   Cardiovascular:  Negative for chest pain, palpitations and leg swelling.  Gastrointestinal:  Negative for abdominal pain, constipation, diarrhea, nausea and vomiting.  Genitourinary:  Negative for dysuria, frequency and hematuria.  Musculoskeletal:  Negative for arthralgias and neck pain.  Skin:  Negative for rash.  Neurological:  Negative for dizziness, weakness, light-headedness, numbness and headaches.  Hematological:  Negative for adenopathy.  Psychiatric/Behavioral:  Negative for behavioral problems, dysphoric mood and sleep disturbance.    Per HPI unless specifically indicated above      Objective:    BP 134/70   Pulse 62   Ht 5\' 9"  (1.753 m)   Wt 229 lb (103.9 kg)   SpO2 99%   BMI 33.82 kg/m   Wt Readings from Last 3 Encounters:  02/13/22  229 lb (103.9 kg)  12/13/21 228 lb (103.4 kg)  10/30/21 229 lb (103.9 kg)    Physical Exam Vitals and nursing note reviewed.  Constitutional:      General: He is not in acute distress.    Appearance: He is well-developed. He is obese. He is not diaphoretic.     Comments: Well-appearing, comfortable, cooperative  HENT:     Head: Normocephalic and atraumatic.  Eyes:     General:        Right eye: No discharge.        Left eye: No discharge.     Conjunctiva/sclera: Conjunctivae normal.  Neck:     Thyroid: No thyromegaly.  Cardiovascular:     Rate and Rhythm: Normal rate. Rhythm irregular.     Pulses: Normal pulses.     Heart sounds: Normal heart sounds. No murmur heard. Pulmonary:     Effort: Pulmonary effort is normal. No respiratory distress.     Breath sounds: Normal breath sounds. No wheezing or rales.  Musculoskeletal:        General: Normal range of motion.     Cervical back: Normal range of motion and neck supple.     Right lower leg: No edema.     Left lower leg: No edema.  Lymphadenopathy:     Cervical: No cervical adenopathy.  Skin:    General: Skin is warm and dry.     Findings: No erythema or rash.  Neurological:     Mental Status: He is alert and oriented to person, place, and time. Mental status is at baseline.  Psychiatric:        Behavior: Behavior normal.     Comments: Well groomed, good eye contact, normal speech and thoughts      Results for orders placed or performed in visit on 02/05/22  Comprehensive Metabolic Panel (CMET)  Result Value Ref Range   Glucose, Bld 90 65 - 99 mg/dL   BUN 11 7 - 25 mg/dL   Creat 14/06/23 4.40 - 1.02 mg/dL   BUN/Creatinine Ratio SEE NOTE: 6 - 22 (calc)   Sodium 142 135 - 146 mmol/L  Potassium 4.0 3.5 - 5.3 mmol/L   Chloride 104 98 - 110 mmol/L   CO2 28 20 - 32 mmol/L   Calcium 9.3 8.6 - 10.3 mg/dL   Total Protein 6.7 6.1 - 8.1 g/dL   Albumin 4.4 3.6 - 5.1 g/dL   Globulin 2.3 1.9 - 3.7 g/dL (calc)   AG Ratio 1.9  1.0 - 2.5 (calc)   Total Bilirubin 0.5 0.2 - 1.2 mg/dL   Alkaline phosphatase (APISO) 60 35 - 144 U/L   AST 14 10 - 35 U/L   ALT 12 9 - 46 U/L  Lipid panel  Result Value Ref Range   Cholesterol 159 <200 mg/dL   HDL 52 > OR = 40 mg/dL   Triglycerides 378 <588 mg/dL   LDL Cholesterol (Calc) 83 mg/dL (calc)   Total CHOL/HDL Ratio 3.1 <5.0 (calc)   Non-HDL Cholesterol (Calc) 107 <130 mg/dL (calc)  PSA  Result Value Ref Range   PSA 2.34 < OR = 4.00 ng/mL  CBC with Differential  Result Value Ref Range   WBC 5.3 3.8 - 10.8 Thousand/uL   RBC 3.86 (L) 4.20 - 5.80 Million/uL   Hemoglobin 12.0 (L) 13.2 - 17.1 g/dL   HCT 50.2 (L) 77.4 - 12.8 %   MCV 92.2 80.0 - 100.0 fL   MCH 31.1 27.0 - 33.0 pg   MCHC 33.7 32.0 - 36.0 g/dL   RDW 78.6 76.7 - 20.9 %   Platelets 272 140 - 400 Thousand/uL   MPV 10.5 7.5 - 12.5 fL   Neutro Abs 2,772 1,500 - 7,800 cells/uL   Lymphs Abs 1,860 850 - 3,900 cells/uL   Absolute Monocytes 519 200 - 950 cells/uL   Eosinophils Absolute 138 15 - 500 cells/uL   Basophils Absolute 11 0 - 200 cells/uL   Neutrophils Relative % 52.3 %   Total Lymphocyte 35.1 %   Monocytes Relative 9.8 %   Eosinophils Relative 2.6 %   Basophils Relative 0.2 %  HgB A1c  Result Value Ref Range   Hgb A1c MFr Bld 5.8 (H) <5.7 % of total Hgb   Mean Plasma Glucose 120 mg/dL   eAG (mmol/L) 6.6 mmol/L      Assessment & Plan:   Problem List Items Addressed This Visit     Atrial fibrillation (HCC)    Chronic Atrial Fibrillation On Anticoagulation Followed by Cardiology      Relevant Medications   atorvastatin (LIPITOR) 40 MG tablet   doxazosin (CARDURA) 2 MG tablet   BPH with obstruction/lower urinary tract symptoms    Stable chronic BPH with LUTS primarily nocturia Improved PSA 2 range now - [facility] - No known personal/family history of prostate CA - fam history of brother with prostate biopsy/BPH Failed Myrbetriq for possible OAB due to cost  Followed  by [facility] Off Myrbetriq, Oxybutynin XL, Solifenacin  Plan: 1. Continue current dose Doxazosin 2mg  BID for now - cannot tolerate higher dose up to 4mg  per  Advised him to return to [facility] for further PSA monitor and BPH treatment      Relevant Medications   doxazosin (CARDURA) 2 MG tablet   Elevated hemoglobin A1c    Stable controlled A1c 5.8 Without worsening Encourage lifestyle intervention      Essential hypertension    Controlled BP currently No known complications Failed HCTZ (hyponatremia)  Plan: 1. Continue Amlodipine 5mg  daily, Carvedilol 6.25mg  BID, Doxazosin 2mg  BID 2. Closely watch BP at home, monitor regularly, notify  office if elevated BP >140/90 persistently - discussion on goal BP < 150, despite patient not interested in lower BP again as he experienced in past, he is comfortable with lesser control and understands risks 4. Encouraged will need to work on gradual improving exercise, continue hydration, low sodium diet 5. Follow-up 6 months      Relevant Medications   atorvastatin (LIPITOR) 40 MG tablet   doxazosin (CARDURA) 2 MG tablet   Hyperlipidemia   Relevant Medications   atorvastatin (LIPITOR) 40 MG tablet   doxazosin (CARDURA) 2 MG tablet   Insomnia    Chronic insomnia, previously managed by [facility] PHQ negative today Maintained on Seroquel 50mg  nightly      Relevant Medications   QUEtiapine (SEROQUEL) 50 MG tablet   Major depression, recurrent, full remission (HCC)    Remission On Seroquel No longer w/ Psych  Note with wife's passing recently he has had some down mood but not admitting depression. He has family support system      SIADH (syndrome of inappropriate ADH production) (HCC)   Other Visit Diagnoses     Annual physical exam    -  Primary       Updated Health Maintenance information Reviewed recent lab results with patient Encouraged improvement to lifestyle with diet and exercise Goal of weight  loss   Meds ordered this encounter  Medications   atorvastatin (LIPITOR) 40 MG tablet    Sig: Take 1 tablet (40 mg total) by mouth daily.    Dispense:  90 tablet    Refill:  3    Add refills on file   QUEtiapine (SEROQUEL) 50 MG tablet    Sig: Take 1 tablet (50 mg total) by mouth at bedtime.    Dispense:  90 tablet    Refill:  3    Keep on hold or refills on file until ready   doxazosin (CARDURA) 2 MG tablet    Sig: Take 1 tablet (2 mg total) by mouth 2 (two) times daily.    Dispense:  180 tablet    Refill:  3    Add refills on file      Follow up plan: Return in about 6 months (around 08/15/2022) for 6 month fasting lab only then 1 week later Follow-up HTN, HLD OAB updates.  Future labs 6 month  Saralyn PilarAlexander Cason Luffman, DO [facility] 02/13/2022, 10:44 AM

## 2022-05-26 DIAGNOSIS — H35372 Puckering of macula, left eye: Secondary | ICD-10-CM | POA: Diagnosis not present

## 2022-05-26 DIAGNOSIS — H40053 Ocular hypertension, bilateral: Secondary | ICD-10-CM | POA: Diagnosis not present

## 2022-07-03 DIAGNOSIS — H02831 Dermatochalasis of right upper eyelid: Secondary | ICD-10-CM | POA: Diagnosis not present

## 2022-07-03 DIAGNOSIS — L574 Cutis laxa senilis: Secondary | ICD-10-CM | POA: Diagnosis not present

## 2022-07-03 DIAGNOSIS — H02834 Dermatochalasis of left upper eyelid: Secondary | ICD-10-CM | POA: Diagnosis not present

## 2022-08-15 ENCOUNTER — Other Ambulatory Visit: Payer: Medicare Other

## 2022-08-15 DIAGNOSIS — E782 Mixed hyperlipidemia: Secondary | ICD-10-CM

## 2022-08-15 DIAGNOSIS — I10 Essential (primary) hypertension: Secondary | ICD-10-CM

## 2022-08-15 DIAGNOSIS — R7309 Other abnormal glucose: Secondary | ICD-10-CM | POA: Diagnosis not present

## 2022-08-15 DIAGNOSIS — N138 Other obstructive and reflux uropathy: Secondary | ICD-10-CM | POA: Diagnosis not present

## 2022-08-15 DIAGNOSIS — Z0000 Encounter for general adult medical examination without abnormal findings: Secondary | ICD-10-CM

## 2022-08-16 LAB — LIPID PANEL
Cholesterol: 159 mg/dL (ref <200)
HDL: 43 mg/dL (ref >=40)
LDL Cholesterol (Calc): 89 mg/dL (calc)
Non-HDL Cholesterol (Calc): 116 mg/dL (calc) (ref <130)
Total CHOL/HDL Ratio: 3.7 (calc) (ref <5.0)
Triglycerides: 177 mg/dL — ABNORMAL HIGH (ref <150)

## 2022-08-16 LAB — CBC WITH DIFFERENTIAL/PLATELET
Absolute Monocytes: 502 cells/uL (ref 200–950)
Basophils Absolute: 32 cells/uL (ref 0–200)
Basophils Relative: 0.6 %
Eosinophils Absolute: 119 cells/uL (ref 15–500)
Eosinophils Relative: 2.2 %
HCT: 37.2 % — ABNORMAL LOW (ref 38.5–50.0)
Hemoglobin: 12.2 g/dL — ABNORMAL LOW (ref 13.2–17.1)
Lymphs Abs: 1728 cells/uL (ref 850–3900)
MCH: 31.4 pg (ref 27.0–33.0)
MCHC: 32.8 g/dL (ref 32.0–36.0)
MCV: 95.9 fL (ref 80.0–100.0)
MPV: 10.2 fL (ref 7.5–12.5)
Monocytes Relative: 9.3 %
Neutro Abs: 3019 cells/uL (ref 1500–7800)
Neutrophils Relative %: 55.9 %
Platelets: 268 10*3/uL (ref 140–400)
RBC: 3.88 10*6/uL — ABNORMAL LOW (ref 4.20–5.80)
RDW: 13.1 % (ref 11.0–15.0)
Total Lymphocyte: 32 %
WBC: 5.4 10*3/uL (ref 3.8–10.8)

## 2022-08-16 LAB — HEMOGLOBIN A1C
Hgb A1c MFr Bld: 5.9 % of total Hgb — ABNORMAL HIGH (ref <5.7)
Mean Plasma Glucose: 123 mg/dL
eAG (mmol/L): 6.8 mmol/L

## 2022-08-16 LAB — COMPLETE METABOLIC PANEL WITH GFR
AG Ratio: 1.7 (calc) (ref 1.0–2.5)
ALT: 14 U/L (ref 9–46)
AST: 15 U/L (ref 10–35)
Albumin: 4.1 g/dL (ref 3.6–5.1)
Alkaline phosphatase (APISO): 56 U/L (ref 35–144)
BUN: 10 mg/dL (ref 7–25)
CO2: 26 mmol/L (ref 20–32)
Calcium: 9 mg/dL (ref 8.6–10.3)
Chloride: 104 mmol/L (ref 98–110)
Creat: 0.7 mg/dL (ref 0.70–1.22)
Globulin: 2.4 g/dL (calc) (ref 1.9–3.7)
Glucose, Bld: 89 mg/dL (ref 65–99)
Potassium: 3.6 mmol/L (ref 3.5–5.3)
Sodium: 141 mmol/L (ref 135–146)
Total Bilirubin: 0.7 mg/dL (ref 0.2–1.2)
Total Protein: 6.5 g/dL (ref 6.1–8.1)
eGFR: 90 mL/min/{1.73_m2} (ref >=60)

## 2022-08-16 LAB — PSA: PSA: 2.66 ng/mL (ref <=4.00)

## 2022-08-16 LAB — TSH: TSH: 3.07 mIU/L (ref 0.40–4.50)

## 2022-08-19 ENCOUNTER — Other Ambulatory Visit: Payer: Self-pay | Admitting: Family Medicine

## 2022-08-19 ENCOUNTER — Ambulatory Visit (INDEPENDENT_AMBULATORY_CARE_PROVIDER_SITE_OTHER): Payer: Medicare Other | Admitting: Family Medicine

## 2022-08-19 ENCOUNTER — Encounter: Payer: Self-pay | Admitting: Family Medicine

## 2022-08-19 VITALS — BP 146/62 | HR 69 | Ht 69.0 in | Wt 225.0 lb

## 2022-08-19 DIAGNOSIS — E782 Mixed hyperlipidemia: Secondary | ICD-10-CM | POA: Diagnosis not present

## 2022-08-19 DIAGNOSIS — N138 Other obstructive and reflux uropathy: Secondary | ICD-10-CM | POA: Diagnosis not present

## 2022-08-19 DIAGNOSIS — E669 Obesity, unspecified: Secondary | ICD-10-CM

## 2022-08-19 DIAGNOSIS — I10 Essential (primary) hypertension: Secondary | ICD-10-CM | POA: Diagnosis not present

## 2022-08-19 DIAGNOSIS — N401 Enlarged prostate with lower urinary tract symptoms: Secondary | ICD-10-CM | POA: Diagnosis not present

## 2022-08-19 DIAGNOSIS — Z0000 Encounter for general adult medical examination without abnormal findings: Secondary | ICD-10-CM

## 2022-08-19 DIAGNOSIS — F3342 Major depressive disorder, recurrent, in full remission: Secondary | ICD-10-CM

## 2022-08-19 DIAGNOSIS — R7309 Other abnormal glucose: Secondary | ICD-10-CM | POA: Diagnosis not present

## 2022-08-19 DIAGNOSIS — E222 Syndrome of inappropriate secretion of antidiuretic hormone: Secondary | ICD-10-CM | POA: Diagnosis not present

## 2022-08-19 DIAGNOSIS — I4821 Permanent atrial fibrillation: Secondary | ICD-10-CM | POA: Diagnosis not present

## 2022-08-19 MED ORDER — AMLODIPINE BESYLATE 5 MG PO TABS: 5.0000 mg | ORAL_TABLET | Freq: Every day | ORAL | 3 refills | Status: DC

## 2022-08-19 NOTE — Assessment & Plan Note (Addendum)
Chronic Atrial Fibrillation On Anticoagulation Followed by Cardiology  Instructions given for holding Eliquis 48 hr prior to procedure surgery then restart 24 hours later.

## 2022-08-19 NOTE — Assessment & Plan Note (Signed)
Stable controlled A1c 5.9 Without worsening Encourage lifestyle intervention

## 2022-08-19 NOTE — Assessment & Plan Note (Signed)
Stable Some fluid imbalance, previous wore compression

## 2022-08-19 NOTE — Assessment & Plan Note (Signed)
Remission On Seroquel No longer w/ Psych 

## 2022-08-19 NOTE — Assessment & Plan Note (Addendum)
Stable chronic BPH with LUTS primarily nocturia Improved PSA 2-3 stable range. - [facility] - No known personal/family history of prostate CA - fam history of brother with prostate biopsy/BPH Failed Myrbetriq for possible OAB due to cost  Followed by [facility] Off Myrbetriq, Oxybutynin XL, Solifenacin  Plan: 1. Continue current dose Doxazosin 2mg  BID for now - cannot tolerate higher dose up to 4mg  per

## 2022-08-19 NOTE — Patient Instructions (Addendum)
Thank you for coming to the office today.  BP elevated. Seems to improve when repeated.  He is on Eliquis 5mg  twice a day for anticoagulation due to Paroxysmal Atrial Fibrillation.  For the upcoming eyelid surgery, I will recommend the following management:  - HOLD Eliquis for 48 hours PRIOR to surgery - RESTART Eliquis 24 hours AFTER procedure  BP improved today Keep monitoring readings, if new concerns or questions, please let us know.   DUE for FASTING BLOOD WORK (no food or drink after midnight before the lab appointment, only water or coffee without cream/sugar on the morning of)  SCHEDULE "Lab Only" visit in the morning at the clinic for lab draw in 6 MONTHS   - Make sure Lab Only appointment is at about 1 week before your next appointment, so that results will be available  For Lab Results, once available within 2-3 days of blood draw, you can can log in to MyChart online to view your results and a brief explanation. Also, we can discuss results at next follow-up visit.   Please schedule a Follow-up Appointment to: Return in about 6 months (around 02/18/2023) for 6 month PreDM A1c, HTN updates.  If you have any other questions or concerns, please feel free to call the office or send a message through MyChart. You may also schedule an earlier appointment if necessary.  Additionally, you may be receiving a survey about your experience at our office within a few days to 1 week by e-mail or mail. We value your feedback.  Saralyn Pilar, DO [facility], [place]

## 2022-08-19 NOTE — Assessment & Plan Note (Signed)
Controlled cholesterol on statin and lifestyle Last lipid panel 08/2022 Calculated ASCVD 10 yr risk score elevated  Plan: 1. Continue current meds - Atorvastatin 40mg  daily 2. Continue ASA 81mg  for primary ASCVD risk reduction 3. Encourage improved lifestyle - low carb/cholesterol, reduce portion size, continue improving regular exercise

## 2022-08-19 NOTE — Progress Notes (Signed)
Subjective:    Patient ID: Erik Palmer, male    DOB: 04/28/1934, 88 y.o.   MRN: [id number]  Reason Erik Palmer is a 88 y.o. male presenting on 08/19/2022 for Annual Exam   HPI  CHRONIC HTN: Reports elevated BP here, and home readings avg 130/70s  Home BP elevated up to 165, gradual improvement after multiple readings down to 134 on repeat.  Current Meds - Amlodipine 5mg  daily, Carvedilol 6.25mg  BID, Doxazosin 2mg  BID, Lisinopril 5mg  daily  (half of 10mg ) Reports good compliance, took meds today. Tolerating well, w/o complaints. Denies CP, dyspnea, HA, edema, dizziness / lightheadedness   Elevated A1c A1c 5.9 now. Similar to prior. A1c average 5.6 to 5.7  CBGs: not checking CBG regularly Meds: never on med Currently not on ACEi / ARB Lifestyle: - Diet (improved diet)  - Exercise (limited but he is improving exercise) Denies hypoglycemia   HYPERLIPIDEMIA: - Reports no concerns. Last lipid panel 01/2022, controlled results lab with LDL 83, prior 91-96, and TG 139 prior 150+ - Currently taking Atorvastatin 40mg , tolerating well without side effects or myalgias  Upcoming eyelid surgery Asking to pause Eliquis, instructions     PMH - Cardiology / Permanent Atrial Fibrillation / HTN    BPH with LUTS Gross Hematuria [facility] and he is not retaining on PVR He saw Urologist they discussed OAB symptoms Off Vesicare, Oxybutynin and other On Doxazosin   Major Depression recurrent mild vs remission / Insomnia Taking Seroquel 50mg  nightly currently and doing well Wife died in 11-08-2023he has had some sad mood but overall not worsening depression. He is managing with family support system     Health Maintenance:   Elevated PSA / Prostate CA Screening: PSA trend now shows PSA 2.66 (08/2022) - prior 2.34 (01/2022), prior range 2.58 - 3.02 - 4.12 in past few years      08/19/2022   10:35 AM 02/13/2022   10:37 AM 12/13/2021    3:00 PM   Depression screen PHQ 2/9  Decreased Interest 0 0 0  Down, Depressed, Hopeless 0 0 0  PHQ - 2 Score 0 0 0  Altered sleeping 0 0   Tired, decreased energy 0 0   Change in appetite 0 0   Feeling bad or failure about yourself  0 0   Trouble concentrating 0 0   Moving slowly or fidgety/restless 0 0   Suicidal thoughts 0 0   PHQ-9 Score 0 0   Difficult doing work/chores  Not difficult at all     Social History   Tobacco Use   Smoking status: Never   Smokeless tobacco: Never  Vaping Use   Vaping Use: Never used  Substance Use Topics   Alcohol use: No    Alcohol/week: 0.0 standard drinks of alcohol   Drug use: No    Review of Systems  Constitutional:  Negative for activity change, appetite change, chills, diaphoresis, fatigue and fever.  HENT:  Negative for congestion and hearing loss.   Eyes:  Negative for visual disturbance.  Respiratory:  Negative for cough, chest tightness, shortness of breath and wheezing.   Cardiovascular:  Negative for chest pain, palpitations and leg swelling.  Gastrointestinal:  Negative for abdominal pain, constipation, diarrhea, nausea and vomiting.  Genitourinary:  Negative for dysuria, frequency and hematuria.  Musculoskeletal:  Negative for arthralgias and neck pain.  Skin:  Negative for rash.  Neurological:  Negative for dizziness, weakness, light-headedness, numbness and headaches.  Hematological:  Negative for adenopathy.  Psychiatric/Behavioral:  Negative for behavioral problems, dysphoric mood and sleep disturbance.    Per HPI unless specifically indicated above     Objective:    BP (!) 146/62 (BP Location: Left Arm, Cuff Size: Normal)   Pulse 69   Ht 5\' 9"  (1.753 m)   Wt 225 lb (102.1 kg)   SpO2 97%   BMI 33.23 kg/m   Wt Readings from Last 3 Encounters:  08/19/22 225 lb (102.1 kg)  02/13/22 229 lb (103.9 kg)  12/13/21 228 lb (103.4 kg)    Physical Exam Vitals and nursing note reviewed.  Constitutional:      General: He is  not in acute distress.    Appearance: He is well-developed. He is not diaphoretic.     Comments: Well-appearing, comfortable, cooperative  HENT:     Head: Normocephalic and atraumatic.  Eyes:     General:        Right eye: No discharge.        Left eye: No discharge.     Conjunctiva/sclera: Conjunctivae normal.     Pupils: Pupils are equal, round, and reactive to light.  Neck:     Thyroid: No thyromegaly.     Vascular: No carotid bruit.  Cardiovascular:     Rate and Rhythm: Normal rate and regular rhythm.     Pulses: Normal pulses.     Heart sounds: Normal heart sounds. No murmur heard. Pulmonary:     Effort: Pulmonary effort is normal. No respiratory distress.     Breath sounds: Normal breath sounds. No wheezing or rales.  Abdominal:     General: Bowel sounds are normal. There is no distension.     Palpations: Abdomen is soft. There is no mass.     Tenderness: There is no abdominal tenderness.  Musculoskeletal:        General: No tenderness. Normal range of motion.     Cervical back: Normal range of motion and neck supple.     Right lower leg: No edema.     Left lower leg: No edema.     Comments: Upper / Lower Extremities: - Normal muscle tone, strength bilateral upper extremities 5/5, lower extremities 5/5  Lymphadenopathy:     Cervical: No cervical adenopathy.  Skin:    General: Skin is warm and dry.     Findings: No erythema or rash.  Neurological:     Mental Status: He is alert and oriented to person, place, and time.     Comments: Distal sensation intact to light touch all extremities  Psychiatric:        Mood and Affect: Mood normal.        Behavior: Behavior normal.        Thought Content: Thought content normal.     Comments: Well groomed, good eye contact, normal speech and thoughts     Results for orders placed or performed in visit on 08/15/22  TSH  Result Value Ref Range   TSH 3.07 0.40 - 4.50 mIU/L  PSA  Result Value Ref Range   PSA 2.66 < OR = 4.00  ng/mL  Hemoglobin A1c  Result Value Ref Range   Hgb A1c MFr Bld 5.9 (H) <5.7 % of total Hgb   Mean Plasma Glucose 123 mg/dL   eAG (mmol/L) 6.8 mmol/L  Lipid panel  Result Value Ref Range   Cholesterol 159 <200 mg/dL   HDL 43 > OR = 40 mg/dL   Triglycerides 086 (H) <  150 mg/dL   LDL Cholesterol (Calc) 89 mg/dL (calc)   Total CHOL/HDL Ratio 3.7 <5.0 (calc)   Non-HDL Cholesterol (Calc) 116 <130 mg/dL (calc)  CBC with Differential/Platelet  Result Value Ref Range   WBC 5.4 3.8 - 10.8 Thousand/uL   RBC 3.88 (L) 4.20 - 5.80 Million/uL   Hemoglobin 12.2 (L) 13.2 - 17.1 g/dL   HCT 16.1 (L) 09.6 - 04.5 %   MCV 95.9 80.0 - 100.0 fL   MCH 31.4 27.0 - 33.0 pg   MCHC 32.8 32.0 - 36.0 g/dL   RDW 40.9 81.1 - 91.4 %   Platelets 268 140 - 400 Thousand/uL   MPV 10.2 7.5 - 12.5 fL   Neutro Abs 3,019 1,500 - 7,800 cells/uL   Lymphs Abs 1,728 850 - 3,900 cells/uL   Absolute Monocytes 502 200 - 950 cells/uL   Eosinophils Absolute 119 15 - 500 cells/uL   Basophils Absolute 32 0 - 200 cells/uL   Neutrophils Relative % 55.9 %   Total Lymphocyte 32.0 %   Monocytes Relative 9.3 %   Eosinophils Relative 2.2 %   Basophils Relative 0.6 %  COMPLETE METABOLIC PANEL WITH GFR  Result Value Ref Range   Glucose, Bld 89 65 - 99 mg/dL   BUN 10 7 - 25 mg/dL   Creat 7.82 9.56 - 2.13 mg/dL   eGFR 90 > OR = 60 YQ/MVH/8.46N6   BUN/Creatinine Ratio SEE NOTE: 6 - 22 (calc)   Sodium 141 135 - 146 mmol/L   Potassium 3.6 3.5 - 5.3 mmol/L   Chloride 104 98 - 110 mmol/L   CO2 26 20 - 32 mmol/L   Calcium 9.0 8.6 - 10.3 mg/dL   Total Protein 6.5 6.1 - 8.1 g/dL   Albumin 4.1 3.6 - 5.1 g/dL   Globulin 2.4 1.9 - 3.7 g/dL (calc)   AG Ratio 1.7 1.0 - 2.5 (calc)   Total Bilirubin 0.7 0.2 - 1.2 mg/dL   Alkaline phosphatase (APISO) 56 35 - 144 U/L   AST 15 10 - 35 U/L   ALT 14 9 - 46 U/L      Assessment & Plan:   Problem List Items Addressed This Visit     Atrial fibrillation (HCC)    Chronic Atrial  Fibrillation On Anticoagulation Followed by Cardiology  Instructions given for holding Eliquis 48 hr prior to procedure surgery then restart 24 hours later.      Relevant Medications   amLODipine (NORVASC) 5 MG tablet   BPH with obstruction/lower urinary tract symptoms    Stable chronic BPH with LUTS primarily nocturia Improved PSA 2-3 stable range. - [facility] - No known personal/family history of prostate CA - fam history of brother with prostate biopsy/BPH Failed Myrbetriq for possible OAB due to cost  Followed by [facility] Off Myrbetriq, Oxybutynin XL, Solifenacin  Plan: 1. Continue current dose Doxazosin 2mg  BID for now - cannot tolerate higher dose up to 4mg  per      Elevated hemoglobin A1c    Stable controlled A1c 5.9 Without worsening Encourage lifestyle intervention      Essential hypertension - Primary    Controlled BP currently No known complications Failed HCTZ (hyponatremia)  Plan: 1. Continue Amlodipine 5mg  daily, Carvedilol 6.25mg  BID, Doxazosin 2mg  BID 2. Closely watch BP at home, monitor regularly, notify office if elevated BP >140/90 persistently - discussion on goal BP < 150, despite patient not interested in lower BP again as he experienced in past, he  is comfortable with lesser control and understands risks 4. Encouraged will need to work on gradual improving exercise, continue hydration, low sodium diet 5. Follow-up 6 months      Relevant Medications   amLODipine (NORVASC) 5 MG tablet   Hyperlipidemia    Controlled cholesterol on statin and lifestyle Last lipid panel 08/2022 Calculated ASCVD 10 yr risk score elevated  Plan: 1. Continue current meds - Atorvastatin 40mg  daily 2. Continue ASA 81mg  for primary ASCVD risk reduction 3. Encourage improved lifestyle - low carb/cholesterol, reduce portion size, continue improving regular exercise      Relevant Medications   amLODipine (NORVASC) 5 MG tablet   Major depression,  recurrent, full remission (HCC)    Remission On Seroquel No longer w/ Psych      SIADH (syndrome of inappropriate ADH production) (HCC)    Stable Some fluid imbalance, previous wore compression      Updated Health Maintenance information Reviewed recent lab results with patient Encouraged improvement to lifestyle with diet and exercise Goal of weight loss     Meds ordered this encounter  Medications   amLODipine (NORVASC) 5 MG tablet    Sig: Take 1 tablet (5 mg total) by mouth daily.    Dispense:  90 tablet    Refill:  3    Add refills for future    Follow up plan: Return in about 6 months (around 02/18/2023) for 6 month PreDM A1c, HTN updates.  Future labs ordered for 02/18/23 Add all labs + Urinalysis    Saralyn Pilar, DO [facility] 08/19/2022, 10:41 AM

## 2022-08-19 NOTE — Assessment & Plan Note (Signed)
Controlled BP currently No known complications Failed HCTZ (hyponatremia)  Plan: 1. Continue Amlodipine 5mg daily, Carvedilol 6.25mg BID, Doxazosin 2mg BID 2. Closely watch BP at home, monitor regularly, notify office if elevated BP >140/90 persistently - discussion on goal BP < 150, despite patient not interested in lower BP again as he experienced in past, he is comfortable with lesser control and understands risks 4. Encouraged will need to work on gradual improving exercise, continue hydration, low sodium diet 5. Follow-up 6 months 

## 2022-08-22 ENCOUNTER — Encounter: Payer: Medicare Other | Admitting: Family Medicine

## 2022-09-02 DIAGNOSIS — J40 Bronchitis, not specified as acute or chronic: Secondary | ICD-10-CM | POA: Diagnosis not present

## 2022-09-02 DIAGNOSIS — Z03818 Encounter for observation for suspected exposure to other biological agents ruled out: Secondary | ICD-10-CM | POA: Diagnosis not present

## 2022-09-02 DIAGNOSIS — R051 Acute cough: Secondary | ICD-10-CM | POA: Diagnosis not present

## 2022-09-02 DIAGNOSIS — J019 Acute sinusitis, unspecified: Secondary | ICD-10-CM | POA: Diagnosis not present

## 2022-09-02 DIAGNOSIS — U071 COVID-19: Secondary | ICD-10-CM | POA: Diagnosis not present

## 2022-09-08 ENCOUNTER — Ambulatory Visit: Payer: Medicare Other

## 2022-09-08 DIAGNOSIS — Z03818 Encounter for observation for suspected exposure to other biological agents ruled out: Secondary | ICD-10-CM | POA: Diagnosis not present

## 2022-09-08 DIAGNOSIS — U071 COVID-19: Secondary | ICD-10-CM | POA: Diagnosis not present

## 2022-09-08 DIAGNOSIS — R0789 Other chest pain: Secondary | ICD-10-CM | POA: Diagnosis not present

## 2022-09-08 DIAGNOSIS — J40 Bronchitis, not specified as acute or chronic: Secondary | ICD-10-CM | POA: Diagnosis not present

## 2022-09-08 DIAGNOSIS — R051 Acute cough: Secondary | ICD-10-CM | POA: Diagnosis not present

## 2022-10-22 ENCOUNTER — Other Ambulatory Visit: Payer: Self-pay | Admitting: Family Medicine

## 2022-10-22 DIAGNOSIS — K219 Gastro-esophageal reflux disease without esophagitis: Secondary | ICD-10-CM

## 2022-10-22 DIAGNOSIS — I10 Essential (primary) hypertension: Secondary | ICD-10-CM

## 2022-10-23 NOTE — Telephone Encounter (Signed)
Requested Prescriptions  Pending Prescriptions Disp Refills   amLODipine (NORVASC) 5 MG tablet [Pharmacy Med Name: AMLODIPINE BESYLATE 5 MG TAB] 90 tablet 3    Sig: TAKE 1 TABLET (5 MG TOTAL) BY MOUTH DAILY.     Cardiovascular: Calcium Channel Blockers 2 Failed - 10/22/2022  1:27 AM      Failed - Last BP in normal range    BP Readings from Last 1 Encounters:  08/19/22 (!) 146/62         Passed - Last Heart Rate in normal range    Pulse Readings from Last 1 Encounters:  08/19/22 69         Passed - Valid encounter within last 6 months    Recent Outpatient Visits           2 months ago Essential hypertension   [place] [facility], Netta Neat, DO   8 months ago Annual physical exam   [place] [facility] Smitty Cords, DO   1 year ago Essential hypertension   [place] [facility] Smitty Cords, DO   1 year ago BPH with obstruction/lower urinary tract symptoms   [place] [facility], Netta Neat, DO   2 years ago Acute cystitis with hematuria   [place] [facility] Althea Charon, Netta Neat, DO       Future Appointments             In 4 months Althea Charon, Netta Neat, DO [facility]             omeprazole (PRILOSEC) 20 MG capsule [Pharmacy Med Name: OMEPRAZOLE DR 20 MG CAPSULE] 180 capsule 2    Sig: TAKE 1 CAPSULE BY MOUTH TWICE A DAY     Gastroenterology: Proton Pump Inhibitors Passed - 10/22/2022  1:27 AM      Passed - Valid encounter within last 12 months    Recent Outpatient Visits           2 months ago Essential hypertension   [place] [facility], Netta Neat, DO   8 months ago Annual physical exam   [place] [facility] Smitty Cords, DO   1 year ago Essential hypertension   [place] [facility]  Smitty Cords, DO   1 year ago BPH with obstruction/lower urinary tract symptoms   [place] [facility] Smitty Cords, DO   2 years ago Acute cystitis with hematuria   [place] [facility] Althea Charon, Netta Neat, DO       Future Appointments             In 4 months Althea Charon, Netta Neat, DO [facility]

## 2022-11-05 ENCOUNTER — Encounter: Payer: Self-pay | Admitting: Ophthalmology

## 2022-11-12 NOTE — Discharge Instructions (Signed)

## 2022-11-14 ENCOUNTER — Other Ambulatory Visit: Payer: Self-pay

## 2022-11-14 ENCOUNTER — Encounter
Admission: RE | Disposition: A | Discharge status: Home / Self Care | Payer: Self-pay | Source: Home / Self Care | Attending: Ophthalmology

## 2022-11-14 ENCOUNTER — Ambulatory Visit: Payer: Medicare Other | Admitting: Anesthesiology

## 2022-11-14 ENCOUNTER — Encounter: Payer: Self-pay | Admitting: Ophthalmology

## 2022-11-14 ENCOUNTER — Ambulatory Visit
Admission: RE | Admit: 2022-11-14 | Discharge: 2022-11-14 | Disposition: A | Discharge status: Home / Self Care | Payer: Medicare Other | Attending: Ophthalmology | Admitting: Ophthalmology

## 2022-11-14 DIAGNOSIS — Z8249 Family history of ischemic heart disease and other diseases of the circulatory system: Secondary | ICD-10-CM | POA: Diagnosis not present

## 2022-11-14 DIAGNOSIS — H02831 Dermatochalasis of right upper eyelid: Secondary | ICD-10-CM | POA: Insufficient documentation

## 2022-11-14 DIAGNOSIS — E7800 Pure hypercholesterolemia, unspecified: Secondary | ICD-10-CM | POA: Insufficient documentation

## 2022-11-14 DIAGNOSIS — H02834 Dermatochalasis of left upper eyelid: Secondary | ICD-10-CM | POA: Insufficient documentation

## 2022-11-14 DIAGNOSIS — I10 Essential (primary) hypertension: Secondary | ICD-10-CM | POA: Diagnosis not present

## 2022-11-14 DIAGNOSIS — Z7901 Long term (current) use of anticoagulants: Secondary | ICD-10-CM | POA: Diagnosis not present

## 2022-11-14 DIAGNOSIS — Z96641 Presence of right artificial hip joint: Secondary | ICD-10-CM | POA: Insufficient documentation

## 2022-11-14 DIAGNOSIS — H57813 Brow ptosis, bilateral: Secondary | ICD-10-CM | POA: Insufficient documentation

## 2022-11-14 DIAGNOSIS — Z951 Presence of aortocoronary bypass graft: Secondary | ICD-10-CM | POA: Insufficient documentation

## 2022-11-14 DIAGNOSIS — I4891 Unspecified atrial fibrillation: Secondary | ICD-10-CM | POA: Diagnosis not present

## 2022-11-14 DIAGNOSIS — Z79899 Other long term (current) drug therapy: Secondary | ICD-10-CM | POA: Diagnosis not present

## 2022-11-14 DIAGNOSIS — L574 Cutis laxa senilis: Secondary | ICD-10-CM | POA: Diagnosis not present

## 2022-11-14 HISTORY — PX: BROW LIFT: SHX178

## 2022-11-14 SURGERY — BLEPHAROPLASTY
Anesthesia: General | Laterality: Bilateral

## 2022-11-14 MED ORDER — PROPOFOL 500 MG/50ML IV EMUL
INTRAVENOUS | Status: DC | PRN
  Administered 2022-11-14: 50 ug/kg/min via INTRAVENOUS
  Administered 2022-11-14: 20 mg via INTRAVENOUS

## 2022-11-14 MED ORDER — LACTATED RINGERS IV SOLN: INTRAVENOUS | Status: DC

## 2022-11-14 MED ORDER — LIDOCAINE-EPINEPHRINE 2 %-1:100000 IJ SOLN
INTRAMUSCULAR | Status: DC | PRN
  Administered 2022-11-14: 7.5 mL via OPHTHALMIC

## 2022-11-14 MED ORDER — ONDANSETRON HCL 4 MG/2ML IJ SOLN
INTRAMUSCULAR | Status: DC | PRN
Start: 2022-11-14 — End: 2022-11-14
  Administered 2022-11-14: 4 mg via INTRAVENOUS

## 2022-11-14 MED ORDER — MIDAZOLAM HCL 2 MG/2ML IJ SOLN
INTRAMUSCULAR | Status: DC | PRN
  Administered 2022-11-14 (×2): 1 mg via INTRAVENOUS

## 2022-11-14 MED ORDER — DEXAMETHASONE SODIUM PHOSPHATE 4 MG/ML IJ SOLN
INTRAMUSCULAR | Status: DC | PRN
Start: 2022-11-14 — End: 2022-11-14
  Administered 2022-11-14: 4 mg via INTRAVENOUS

## 2022-11-14 MED ORDER — ERYTHROMYCIN 5 MG/GM OP OINT
TOPICAL_OINTMENT | OPHTHALMIC | Status: DC | PRN
  Administered 2022-11-14: 1 via OPHTHALMIC

## 2022-11-14 MED ORDER — TRAMADOL HCL 50 MG PO TABS
ORAL_TABLET | ORAL | 0 refills | Status: DC
Start: 2022-11-14 — End: 2023-09-10

## 2022-11-14 MED ORDER — ERYTHROMYCIN 5 MG/GM OP OINT: TOPICAL_OINTMENT | OPHTHALMIC | 2 refills | Status: DC

## 2022-11-14 MED ORDER — TETRACAINE HCL 0.5 % OP SOLN
OPHTHALMIC | Status: DC | PRN
  Administered 2022-11-14: 4 [drp]

## 2022-11-14 MED ORDER — FENTANYL CITRATE (PF) 100 MCG/2ML IJ SOLN
INTRAMUSCULAR | Status: DC | PRN
  Administered 2022-11-14: 25 ug via INTRAVENOUS

## 2022-11-14 MED ORDER — BSS IO SOLN
INTRAOCULAR | Status: DC | PRN
  Administered 2022-11-14: 15 mL via INTRAOCULAR

## 2022-11-14 MED ORDER — DEXMEDETOMIDINE HCL IN NACL 200 MCG/50ML IV SOLN
INTRAVENOUS | Status: DC | PRN
  Administered 2022-11-14: 8 ug via INTRAVENOUS

## 2022-11-14 SURGICAL SUPPLY — 24 items
APPLICATOR COTTON TIP WD 3 STR (MISCELLANEOUS) ×2 IMPLANT
BLADE SURG 15 STRL LF DISP TIS (BLADE) ×2 IMPLANT
BLADE SURG 15 STRL SS (BLADE) ×1
CORD BIP STRL DISP 12FT (MISCELLANEOUS) ×2 IMPLANT
GAUZE SPONGE 2X2 STRL 8-PLY (GAUZE/BANDAGES/DRESSINGS) ×20 IMPLANT
GAUZE SPONGE 4X4 12PLY STRL (GAUZE/BANDAGES/DRESSINGS) ×2 IMPLANT
GLOVE SURG UNDER POLY LF SZ7 (GLOVE) ×4 IMPLANT
GOWN STRL REUS W/ TWL LRG LVL3 (GOWN DISPOSABLE) ×2 IMPLANT
GOWN STRL REUS W/TWL LRG LVL3 (GOWN DISPOSABLE) ×1
MARKER SKIN XFINE TIP W/RULER (MISCELLANEOUS) ×2 IMPLANT
NDL FILTER BLUNT 18X1 1/2 (NEEDLE) ×2 IMPLANT
NDL HYPO 30X.5 LL (NEEDLE) ×4 IMPLANT
NEEDLE FILTER BLUNT 18X1 1/2 (NEEDLE) ×1 IMPLANT
NEEDLE HYPO 30X.5 LL (NEEDLE) ×2 IMPLANT
PACK ENT CUSTOM (PACKS) ×2 IMPLANT
SOL PREP PVP 2OZ (MISCELLANEOUS) ×1
SOLUTION PREP PVP 2OZ (MISCELLANEOUS) ×2 IMPLANT
STRAP BODY AND KNEE 60X3 (MISCELLANEOUS) IMPLANT
SUT CHROMIC 5 0 P 3 (SUTURE) IMPLANT
SUT GUT PLAIN 6-0 1X18 ABS (SUTURE) ×2 IMPLANT
SUT PROLENE 5 0 P 3 (SUTURE) IMPLANT
SYR 10ML LL (SYRINGE) ×2 IMPLANT
SYR 3ML LL SCALE MARK (SYRINGE) ×2 IMPLANT
WATER STERILE IRR 250ML POUR (IV SOLUTION) ×2 IMPLANT

## 2022-11-14 NOTE — Interval H&P Note (Signed)
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  The various methods of treatment have been discussed with the patient and family. After consideration of risks, benefits and other options for treatment, the patient has consented to  Procedure(s): BLEPHAROPLASTY UPPER EYELID; W/EXCESS SKIN BROW PTOSIS REPAIR BILATERAL (Bilateral) as a surgical intervention.  The patient's history has been reviewed, patient examined, no change in status, stable for surgery.  I have reviewed the patient's chart and labs.  Questions were answered to the patient's satisfaction.     Ether Griffins, Bellamarie Pflug M

## 2022-11-14 NOTE — Op Note (Signed)
Preoperative Diagnosis:   1.  Visually significant bilateral brow ptosis.  2.  Visually significant dermatochalasis both Upper Eyelid(s)  Postoperative Diagnosis: Same.   Procedure(s) Performed:   1. bilateral  Direct brow lift to improve vision.  2.  Upper eyelid blepharoplasty with excess skin excision bilateral  Upper Eyelid(s)  Surgeon: Hubbard Robinson. Ether Griffins, M.D.   Assistants: None   Anesthesia: MAC  Specimens: None.  Estimated Blood Loss: Minimal.  Complications: None.  Operative Findings: None Dictated  PROCEDURE:   Allergies were reviewed and the patient is allergic to solu-medrol [methylprednisolone sodium succ]..   After the risks, benefits, complications and alternatives were discussed with the patient, appropriate informed consent was obtained. While seated in an upright position and looking in primary gaze, the amount of supra-brow skin to be removed was measured and marked in an elliptical pattern. The patient was then brought to the operating suite and reclined supine.  Timeout was conducted and the patient was sedated. Local anesthetic consisting of a 50-50 mixture of 2% lidocaine with epinephrine and 0.75% bupivacaine with added Hylenex was injected subcutaneously to the bilateral  brow region(s) and down to the periosteum and  subcutaneously to both  upper eyelid(s). After adequate local was instilled, the patient was prepped and draped in the usual sterile fashion for eyelid surgery.   Attention was turned to the right brow region. A #15 blade was used to create a bevelled incision along the premarked incision line. A skin and subcutaneous tissue flap was then excised and hemostasis was obtained with bipolar cautery. The deep tissues were reapproximated with interrupted vertical 5-0 chromic sutures. The skin margin was reapproximated with a running locking 5-0 Prolene suture. Attention was then turned to the opposite brow region where the same procedure was performed in the  same manner.    Attention was then turned to the upper eyelids. A 8mm upper eyelid crease incision line was marked with calipers on both  upper eyelid(s).  A pinch test was used to estimate the amount of excess skin to remove and this was marked in standard blepharoplasty style fashion. Attention was turned to the right  upper eyelid. A #15 blade was used to open the premarked incision line. A Skin and muscle flap was excised and hemostasis was obtained with bipolar cautery.   Attention was then turned to the opposite eyelid where the same procedure was performed in the same manner.   The skin incisions were closed with a combination of interrupted and  running 6-0 fast absorbing plain gut suture.   The patient tolerated the procedure well. Erythromycin ophthalmic ointment was applied to the incision site(s) followed by ice packs.The patient was taken to the recovery area where he recovered without difficulty.  Post-Op Plan/Instructions:  The patient was instructed to use ice packs frequently for the next 48 hours.  he was instructed to use Erythromycin ophthalmic ophthalmic ointment on the eyelid incisions and over-the-counter antibiotic ointment on the brow sutures 4 times a day for the next 12 to 14 days. he was given a prescription for Tramadol (or similar) for pain control should Tylenol not be effective. he was asked to to follow up in 10-12 days time for suture removal or sooner as needed for problems.   Mykaila Blunck M. Ether Griffins, M.D. Ophthalmology

## 2022-11-14 NOTE — Anesthesia Postprocedure Evaluation (Signed)
Anesthesia Post Note  Patient: Izaiah Skates Whetsel  Procedure(s) Performed: BLEPHAROPLASTY UPPER EYELID; W/EXCESS SKIN BROW PTOSIS REPAIR BILATERAL (Bilateral)  Patient location during evaluation: PACU Anesthesia Type: General Level of consciousness: awake and alert Pain management: pain level controlled Vital Signs Assessment: post-procedure vital signs reviewed and stable Respiratory status: spontaneous breathing, nonlabored ventilation, respiratory function stable and patient connected to nasal cannula oxygen Cardiovascular status: blood pressure returned to baseline and stable Postop Assessment: no apparent nausea or vomiting Anesthetic complications: no   No notable events documented.   Last Vitals:  Vitals:   11/14/22 1209 11/14/22 1214  BP:  136/68  Pulse:  62  Resp:  16  Temp: 36.5 C   SpO2:  96%    Last Pain:  Vitals:   11/14/22 1214  TempSrc:   PainSc: 0-No pain                 Corinda Gubler

## 2022-11-14 NOTE — H&P (Signed)
Eye Center: [facility]  Primary Care Physician:  Erik Cords, DO Ophthalmologist: Dr. Hubbard Robinson. Erik Palmer, M.D.  Pre-Procedure History & Physical: HPI:  Erik Palmer is a 88 y.o. male here for periocular surgery.   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   Arthritis    GERD (gastroesophageal reflux disease)    Grief at loss of child 09/03/2014   Hypercholesteremia    Hypertension    Prostate enlargement     Past Surgical History:  Procedure Laterality Date   CATARACT EXTRACTION W/PHACO Right 10/14/2021   Procedure: CATARACT EXTRACTION PHACO AND INTRAOCULAR LENS PLACEMENT (IOC) RIGHT 11.27 01:28.4;  Surgeon: Lockie Mola, MD;  Location: [facility];  Service: Ophthalmology;  Laterality: Right;   CATARACT EXTRACTION W/PHACO Left 10/30/2021   Procedure: CATARACT EXTRACTION PHACO AND INTRAOCULAR LENS PLACEMENT (IOC) LEFT [place] 9.90 01:40.8;  Surgeon: Lockie Mola, MD;  Location: [facility];  Service: Ophthalmology;  Laterality: Left;   CORONARY ARTERY BYPASS GRAFT  03/25/2017   at [facility]   TOTAL HIP ARTHROPLASTY Right 12/05/2015   Procedure: TOTAL HIP ARTHROPLASTY ANTERIOR APPROACH;  Surgeon: Kennedy Bucker, MD;  Location: [facility];  Service: Orthopedics;  Laterality: Right;    Prior to Admission medications   Medication Sig Start Date End Date Taking? Authorizing Provider  acetaminophen (TYLENOL) 650 MG CR tablet Take 500 mg by mouth 2 (two) times daily. Reported on 05/08/2015   Yes [provider]  amLODipine (NORVASC) 5 MG tablet Take 1 tablet (5 mg total) by mouth daily. 08/19/22  Yes Karamalegos, Netta Neat, DO  apixaban (ELIQUIS) 5 MG TABS tablet Take 1 tablet (5 mg total) by mouth 2 (two) times daily. 02/07/21  Yes Karamalegos, Netta Neat, DO  atorvastatin (LIPITOR) 40 MG tablet Take 1 tablet (40 mg total) by mouth daily.  02/13/22  Yes Karamalegos, Netta Neat, DO  carvedilol (COREG) 6.25 MG tablet Take 6.25 mg by mouth 2 (two) times daily with a meal.   Yes [provider]  doxazosin (CARDURA) 2 MG tablet Take 1 tablet (2 mg total) by mouth 2 (two) times daily. 02/13/22  Yes Karamalegos, Netta Neat, DO  fluticasone (FLONASE) 50 MCG/ACT nasal spray SPRAY 2 SPRAYS INTO EACH NOSTRIL EVERY DAY 07/12/20  Yes Karamalegos, Alexander J, DO  lisinopril (ZESTRIL) 10 MG tablet TAKE ONE-HALF TABLET BY MOUTH EVERY DAY FOR BLOOD PRESSURE 09/09/21  Yes [provider]  omeprazole (PRILOSEC) 20 MG capsule TAKE 1 CAPSULE BY MOUTH TWICE A DAY 10/23/22  Yes Karamalegos, Alexander J, DO  QUEtiapine (SEROQUEL) 50 MG tablet Take 1 tablet (50 mg total) by mouth at bedtime. 02/13/22  Yes Karamalegos, Netta Neat, DO    Allergies as of 07/17/2022 - Review Complete 02/13/2022  Allergen Reaction Noted   Solu-medrol [methylprednisolone sodium succ] Anxiety 09/12/2014    Family History  Problem Relation Age of Onset   Hypertension Mother    Heart block Mother    Stroke Mother    Depression Mother    Cancer Father    Heart attack Sister    COPD Sister    Hypertension Brother    Hypertension Brother    Hypertension Brother    Hypertension Brother    Heart attack Brother    Diabetes Neg Hx     Social History   Socioeconomic History   Marital status: Married    Spouse name: Not on file  Number of children: Not on file   Years of education: Not on file   Highest education level: Not on file  Occupational History   Occupation: retired  Tobacco Use   Smoking status: Never   Smokeless tobacco: Never  Vaping Use   Vaping status: Never Used  Substance and Sexual Activity   Alcohol use: No    Alcohol/week: 0.0 standard drinks of alcohol   Drug use: No   Sexual activity: Not Currently  Other Topics Concern   Not on file  Social History Narrative   Not on file   Social Determinants of Health    Financial Resource Strain: Low Risk  (12/13/2021)   Overall Financial Resource Strain (CARDIA)    Difficulty of Paying Living Expenses: Not hard at all  Food Insecurity: No Food Insecurity (12/13/2021)   Hunger Vital Sign    Worried About Running Out of Food in the Last Year: Never true    Ran Out of Food in the Last Year: Never true  Transportation Needs: No Transportation Needs (12/13/2021)   PRAPARE - Administrator, Civil Service (Medical): No    Lack of Transportation (Non-Medical): No  Physical Activity: Inactive (12/13/2021)   Exercise Vital Sign    Days of Exercise per Week: 0 days    Minutes of Exercise per Session: 0 min  Stress: No Stress Concern Present (12/13/2021)   [employer] of Occupational Health - Occupational Stress Questionnaire    Feeling of Stress : Not at all  Social Connections: Somewhat Isolated (07/21/2017)   Social Connection and Isolation Panel [NHANES]    Frequency of Communication with Friends and Family: Never    Frequency of Social Gatherings with Friends and Family: More than three times a week    Attends Religious Services: Never    Database administrator or Organizations: No    Attends Banker Meetings: Never    Marital Status: Married  Catering manager Violence: Not At Risk (07/21/2017)   Humiliation, Afraid, Rape, and Kick questionnaire    Fear of Current or Ex-Partner: No    Emotionally Abused: No    Physically Abused: No    Sexually Abused: No    Review of Systems: See HPI, otherwise negative ROS  Physical Exam: BP (!) 164/71   Temp 98 F (36.7 C) (Temporal)   Resp 14   Ht 5\' 9"  (1.753 m)   Wt 99.8 kg   SpO2 97%   BMI 32.49 kg/m  General:   Alert and cooperative in NAD Head:  Normocephalic and atraumatic. Respiratory:  Normal work of breathing.  Impression/Plan: Erik Palmer is here for periocular surgery.  Risks, benefits, limitations, and alternatives regarding surgery have been reviewed  with the patient.  Questions have been answered.  All parties agreeable.   Imagene Riches, MD  11/14/2022, 10:25 AM

## 2022-11-14 NOTE — Anesthesia Preprocedure Evaluation (Signed)
Anesthesia Evaluation  Patient identified by MRN, date of birth, ID band Patient awake    Reviewed: Allergy & Precautions, NPO status , Patient's Chart, lab work & pertinent test results, reviewed documented beta blocker date and time   History of Anesthesia Complications Negative for: history of anesthetic complications  Airway Mallampati: III  TM Distance: >3 FB Neck ROM: full    Dental  (+) Chipped, Poor Dentition, Missing,    Pulmonary neg pulmonary ROS, neg sleep apnea, neg COPD, Patient abstained from smoking.Not current smoker   Pulmonary exam normal        Cardiovascular Exercise Tolerance: Good METShypertension, Pt. on medications and Pt. on home beta blockers + CAD and + CABG (2019)  (-) Past MI + dysrhythmias Atrial Fibrillation  Rhythm:Regular Rate:Normal     Neuro/Psych  PSYCHIATRIC DISORDERS Anxiety Depression    negative neurological ROS     GI/Hepatic Neg liver ROS,GERD  Controlled and Medicated,,  Endo/Other  neg diabetes  H/o SIADH (syndrome of inappropriate ADH production)  Renal/GU negative Renal ROS     Musculoskeletal  (+) Arthritis ,    Abdominal  (+) + obese  Peds  Hematology negative hematology ROS (+)   Anesthesia Other Findings Past Medical History: No date: Angina pectoris syndrome (HCC) 09/03/2014: Anxiety disorder due to general medical condition No date: Arthritis No date: GERD (gastroesophageal reflux disease) 09/03/2014: Grief at loss of child No date: Hypercholesteremia No date: Hypertension No date: Prostate enlargement  Past Surgical History: 03/25/2017: CORONARY ARTERY BYPASS GRAFT     Comment:  at [facility] 12/05/2015: TOTAL HIP ARTHROPLASTY; Right     Comment:  Procedure: TOTAL HIP ARTHROPLASTY ANTERIOR APPROACH;                Surgeon: Kennedy Bucker, MD;  Location: [facility];  Service:              Orthopedics;  Laterality: Right;  BMI    Body Mass Index: 33.67 kg/m       Reproductive/Obstetrics negative OB ROS                              Anesthesia Physical Anesthesia Plan  ASA: 3  Anesthesia Plan: General   Post-op Pain Management: Minimal or no pain anticipated   Induction: Intravenous  PONV Risk Score and Plan: 2 and Treatment may vary due to age or medical condition  Airway Management Planned: Natural Airway  Additional Equipment: None  Intra-op Plan:   Post-operative Plan:   Informed Consent: I have reviewed the patients History and Physical, chart, labs and discussed the procedure including the risks, benefits and alternatives for the proposed anesthesia with the patient or authorized representative who has indicated his/her understanding and acceptance.     Dental advisory given  Plan Discussed with: Anesthesiologist, CRNA and Surgeon  Anesthesia Plan Comments: (Discussed risks of anesthesia with patient, including possibility of difficulty with spontaneous ventilation under anesthesia necessitating airway intervention, PONV, and rare risks such as cardiac or respiratory or neurological events, and allergic reactions. Discussed the role of CRNA in patient's perioperative care. Patient understands.)         Anesthesia Quick Evaluation

## 2022-11-14 NOTE — Transfer of Care (Signed)
Immediate Anesthesia Transfer of Care Note  Patient: Erik Palmer  Procedure(s) Performed: BLEPHAROPLASTY UPPER EYELID; W/EXCESS SKIN BROW PTOSIS REPAIR BILATERAL (Bilateral)  Patient Location: PACU  Anesthesia Type: General  Level of Consciousness: awake, alert  and patient cooperative  Airway and Oxygen Therapy: Patient Spontanous Breathing and Patient connected to supplemental oxygen  Post-op Assessment: Post-op Vital signs reviewed, Patient's Cardiovascular Status Stable, Respiratory Function Stable, Patent Airway and No signs of Nausea or vomiting  Post-op Vital Signs: Reviewed and stable  Complications: No notable events documented.

## 2022-12-19 ENCOUNTER — Ambulatory Visit (INDEPENDENT_AMBULATORY_CARE_PROVIDER_SITE_OTHER): Payer: Medicare Other

## 2022-12-19 DIAGNOSIS — Z0000 Encounter for general adult medical examination without abnormal findings: Secondary | ICD-10-CM | POA: Diagnosis not present

## 2022-12-19 NOTE — Patient Instructions (Addendum)
Erik Palmer , Thank you for taking time to come for your Medicare Wellness Visit. I appreciate your ongoing commitment to your health goals. Please review the following plan we discussed and let me know if I can assist you in the future.   Referrals/Orders/Follow-Ups/Clinician Recommendations: none  This is a list of the screening recommended for you and due dates:  Health Maintenance  Topic Date Due   Zoster (Shingles) Vaccine (1 of 2) Never done   Flu Shot  10/02/2022   COVID-19 Vaccine (6 - 2023-24 season) 03/28/2023   Medicare Annual Wellness Visit  12/19/2023   DTaP/Tdap/Td vaccine (4 - Td or Tdap) 06/19/2024   Pneumonia Vaccine  Completed   HPV Vaccine  Aged Out    Advanced directives: (ACP Link)Information on Advanced Care Planning can be found at [facility] Advance Health Care Directives Advance Health Care Directives ([email or web address]   Next Medicare Annual Wellness Visit scheduled for next year: Yes   12/24/23 @ 1:40 pm by phone

## 2022-12-19 NOTE — Progress Notes (Signed)
Subjective:   Erik Palmer is a 88 y.o. male who presents for Medicare Annual/Subsequent preventive examination.  Visit Complete: Virtual I connected with  Dagem Lazer Delmar on 12/19/22 by a audio enabled telemedicine application and verified that I am speaking with the correct person using two identifiers.  Patient Location: Home  Provider Location: Office/Clinic  I discussed the limitations of evaluation and management by telemedicine. The patient expressed understanding and agreed to proceed.  Vital Signs: Because this visit was a virtual/telehealth visit, some criteria may be missing or patient reported. Any vitals not documented were not able to be obtained and vitals that have been documented are patient reported.   Cardiac Risk Factors include: advanced age (>43men, >49 women);dyslipidemia;hypertension;male gender;obesity (BMI >30kg/m2)     Objective:    There were no vitals filed for this visit. There is no height or weight on file to calculate BMI.     12/19/2022   10:19 AM 11/14/2022    9:33 AM 12/13/2021    3:00 PM 10/30/2021    7:28 AM 10/14/2021   12:42 PM 11/20/2020    8:21 AM 07/21/2017    8:42 AM  Advanced Directives  Does Patient Have a Medical Advance Directive? No No No No Yes Yes No  Type of Agricultural consultant;Living will Healthcare Power of [place];Living will   Does patient want to make changes to medical advance directive?    No - Patient declined No - Patient declined    Copy of Healthcare Power of Attorney in Chart?     No - copy requested No - copy requested   Would patient like information on creating a medical advance directive? No - Patient declined No - Guardian declined  No - Patient declined   No - Patient declined    Current Medications (verified) Outpatient Encounter Medications as of 12/19/2022  Medication Sig   acetaminophen (TYLENOL) 650 MG CR tablet Take 500 mg by mouth 2 (two) times daily. Reported on  05/08/2015   amLODipine (NORVASC) 5 MG tablet Take 1 tablet (5 mg total) by mouth daily.   apixaban (ELIQUIS) 5 MG TABS tablet Take 1 tablet (5 mg total) by mouth 2 (two) times daily.   atorvastatin (LIPITOR) 40 MG tablet Take 1 tablet (40 mg total) by mouth daily.   carvedilol (COREG) 6.25 MG tablet Take 6.25 mg by mouth 2 (two) times daily with a meal.   doxazosin (CARDURA) 2 MG tablet Take 1 tablet (2 mg total) by mouth 2 (two) times daily.   fluticasone (FLONASE) 50 MCG/ACT nasal spray SPRAY 2 SPRAYS INTO EACH NOSTRIL EVERY DAY   lisinopril (ZESTRIL) 10 MG tablet TAKE ONE-HALF TABLET BY MOUTH EVERY DAY FOR BLOOD PRESSURE   omeprazole (PRILOSEC) 20 MG capsule TAKE 1 CAPSULE BY MOUTH TWICE A DAY   QUEtiapine (SEROQUEL) 50 MG tablet Take 1 tablet (50 mg total) by mouth at bedtime.   erythromycin ophthalmic ointment Apply to sutures 4 times a day for 10-12 days.  Discontinue if allergy develops and call our office (Patient not taking: Reported on 12/19/2022)   traMADol (ULTRAM) 50 MG tablet Take 1 every 4-6 hours as needed for pain not controlled by Tylenol (Patient not taking: Reported on 12/19/2022)   No facility-administered encounter medications on file as of 12/19/2022.    Allergies (verified) Solu-medrol [methylprednisolone sodium succ]   History: Past Medical History:  Diagnosis Date   Angina pectoris syndrome (HCC)    Anxiety  disorder due to general medical condition 09/03/2014   Arthritis    GERD (gastroesophageal reflux disease)    Grief at loss of child 09/03/2014   Hypercholesteremia    Hypertension    Prostate enlargement    Past Surgical History:  Procedure Laterality Date   BROW LIFT Bilateral 11/14/2022   Procedure: BLEPHAROPLASTY UPPER EYELID; W/EXCESS SKIN BROW PTOSIS REPAIR BILATERAL;  Surgeon: Imagene Riches, MD;  Location: [facility];  Service: Ophthalmology;  Laterality: Bilateral;   CATARACT EXTRACTION W/PHACO Right 10/14/2021   Procedure: CATARACT  EXTRACTION PHACO AND INTRAOCULAR LENS PLACEMENT (IOC) RIGHT 11.27 01:28.4;  Surgeon: Lockie Mola, MD;  Location: [facility];  Service: Ophthalmology;  Laterality: Right;   CATARACT EXTRACTION W/PHACO Left 10/30/2021   Procedure: CATARACT EXTRACTION PHACO AND INTRAOCULAR LENS PLACEMENT (IOC) LEFT [place] 9.90 01:40.8;  Surgeon: Lockie Mola, MD;  Location: [facility];  Service: Ophthalmology;  Laterality: Left;   CORONARY ARTERY BYPASS GRAFT  03/25/2017   at [facility]   TOTAL HIP ARTHROPLASTY Right 12/05/2015   Procedure: TOTAL HIP ARTHROPLASTY ANTERIOR APPROACH;  Surgeon: Kennedy Bucker, MD;  Location: [facility];  Service: Orthopedics;  Laterality: Right;   Family History  Problem Relation Age of Onset   Hypertension Mother    Heart block Mother    Stroke Mother    Depression Mother    Cancer Father    Heart attack Sister    COPD Sister    Hypertension Brother    Hypertension Brother    Hypertension Brother    Hypertension Brother    Heart attack Brother    Diabetes Neg Hx    Social History   Socioeconomic History   Marital status: Married    Spouse name: Not on file   Number of children: Not on file   Years of education: Not on file   Highest education level: Not on file  Occupational History   Occupation: retired  Tobacco Use   Smoking status: Never   Smokeless tobacco: Never  Vaping Use   Vaping status: Never Used  Substance and Sexual Activity   Alcohol use: No    Alcohol/week: 0.0 standard drinks of alcohol   Drug use: No   Sexual activity: Not Currently  Other Topics Concern   Not on file  Social History Narrative   Not on file   Social Determinants of Health   Financial Resource Strain: Low Risk  (12/19/2022)   Overall Financial Resource Strain (CARDIA)    Difficulty of Paying Living Expenses: Not hard at all  Food Insecurity: No Food Insecurity (12/19/2022)   Hunger Vital Sign    Worried About Running Out of Food in the Last  Year: Never true    Ran Out of Food in the Last Year: Never true  Transportation Needs: No Transportation Needs (12/19/2022)   PRAPARE - Administrator, Civil Service (Medical): No    Lack of Transportation (Non-Medical): No  Physical Activity: Insufficiently Active (12/19/2022)   Exercise Vital Sign    Days of Exercise per Week: 3 days    Minutes of Exercise per Session: 30 min  Stress: No Stress Concern Present (12/19/2022)   [employer] of Occupational Health - Occupational Stress Questionnaire    Feeling of Stress : Not at all  Social Connections: Socially Isolated (12/19/2022)   Social Connection and Isolation Panel [NHANES]    Frequency of Communication with Friends and Family: More than three times a week    Frequency of Social  Gatherings with Friends and Family: Twice a week    Attends Religious Services: Never    Database administrator or Organizations: No    Attends Banker Meetings: Never    Marital Status: Widowed    Tobacco Counseling Counseling given: Not Answered   Clinical Intake:  Pre-visit preparation completed: Yes  Pain : No/denies pain     Nutritional Status: BMI > 30  Obese Nutritional Risks: None Diabetes: No  How often do you need to have someone help you when you read instructions, pamphlets, or other written materials from your doctor or pharmacy?: 1 - Never  Interpreter Needed?: No  Information entered by :: Kennedy Bucker, LPN   Activities of Daily Living    12/19/2022   10:20 AM 11/14/2022    9:22 AM  In your present state of health, do you have any difficulty performing the following activities:  Hearing? 0 0  Vision? 0 0  Difficulty concentrating or making decisions? 0 0  Walking or climbing stairs? 1 0  Dressing or bathing? 0 0  Doing errands, shopping? 0   Preparing Food and eating ? N   Using the Toilet? N   In the past six months, have you accidently leaked urine? N   Do you have problems with  loss of bowel control? N   Managing your Medications? N   Managing your Finances? N   Housekeeping or managing your Housekeeping? N     Patient Care Team: Smitty Cords, DO as PCP - General (Family Medicine)  Indicate any recent Medical Services you may have received from other than [facility] providers in the past year (date may be approximate).     Assessment:   This is a routine wellness examination for Erik Palmer.  Hearing/Vision screen Hearing Screening - Comments:: No aids Vision Screening - Comments:: No glasses- [facility]   Goals Addressed             This Visit's Progress    DIET - EAT MORE FRUITS AND VEGETABLES         Depression Screen    12/19/2022   10:18 AM 08/19/2022   10:35 AM 02/13/2022   10:37 AM 12/13/2021    3:00 PM 08/08/2021   10:46 AM 02/07/2021    1:20 PM 11/20/2020    8:22 AM  PHQ 2/9 Scores  PHQ - 2 Score 1 0 0 0 1 0 0  PHQ- 9 Score 1 0 0  5 0     Fall Risk    12/19/2022   10:20 AM 08/19/2022   10:35 AM 02/13/2022   10:37 AM 12/13/2021    3:00 PM 02/07/2021    8:41 AM  Fall Risk   Falls in the past year? 0 0 0 0 0  Number falls in past yr: 0  0 0 0  Injury with Fall? 0  0 0 0  Risk for fall due to : No Fall Risks  No Fall Risks Medication side effect   Follow up Falls prevention discussed;Falls evaluation completed  Falls evaluation completed Falls prevention discussed;Education provided;Falls evaluation completed Falls evaluation completed    MEDICARE RISK AT HOME: Medicare Risk at Home Any stairs in or around the home?: Yes If so, are there any without handrails?: No Home free of loose throw rugs in walkways, pet beds, electrical cords, etc?: Yes Adequate lighting in your home to reduce risk of falls?: Yes Life alert?: No Use of a cane, walker or w/c?: No  Grab bars in the bathroom?: Yes Shower chair or bench in shower?: Yes Elevated toilet seat or a handicapped toilet?: No  TIMED UP AND GO:  Was the test performed?  No     Cognitive Function:    12/22/2014    8:59 AM  MMSE - Mini Mental State Exam  Orientation to time 5  Orientation to Place 5  Registration 3  Attention/ Calculation 5  Recall 3  Language- name 2 objects 2  Language- repeat 1  Language- follow 3 step command 3  Language- read & follow direction 1  Write a sentence 1  Copy design 1  Total score 30        12/19/2022   10:21 AM 12/13/2021    3:02 PM 11/20/2020    8:24 AM 07/21/2017    8:43 AM  6CIT Screen  What Year? 0 points 0 points 0 points 0 points  What month? 0 points 0 points 0 points 0 points  What time? 0 points 0 points 0 points 0 points  Count back from 20 0 points 0 points 0 points 0 points  Months in reverse 0 points 0 points 0 points 0 points  Repeat phrase 0 points 6 points 4 points 0 points  Total Score 0 points 6 points 4 points 0 points    Immunizations Immunization History  Administered Date(s) Administered   Influenza-Unspecified 12/30/2002, 01/18/2004, 03/14/2005, 04/25/2005, 01/08/2006, 02/05/2007   Moderna Sars-Covid-2 Vaccination 04/09/2019   PFIZER(Purple Top)SARS-COV-2 Vaccination 04/29/2019, 05/31/2019, 02/07/2020   Pfizer(Comirnaty)Fall Seasonal Vaccine 12 years and older 11/26/2022   Pneumococcal Conjugate-13 06/20/2014   Pneumococcal Polysaccharide-23 05/26/2002   Td 08/01/2005   Td (Adult),unspecified 08/01/2005   Tdap 04/06/2012, 06/20/2014    TDAP status: Up to date  Flu Vaccine status: Up to date- [facility]  Pneumococcal vaccine status: Due, Education has been provided regarding the importance of this vaccine. Advised may receive this vaccine at local pharmacy or Health Dept. Aware to provide a copy of the vaccination record if obtained from local pharmacy or Health Dept. Verbalized acceptance and understanding.  Covid-19 vaccine status: Completed vaccines  Qualifies for Shingles Vaccine? No   Zostavax completed No   Shingrix Completed?: No.    Education has  been provided regarding the importance of this vaccine. Patient has been advised to call insurance company to determine out of pocket expense if they have not yet received this vaccine. Advised may also receive vaccine at local pharmacy or Health Dept. Verbalized acceptance and understanding.  Screening Tests Health Maintenance  Topic Date Due   Zoster Vaccines- Shingrix (1 of 2) Never done   INFLUENZA VACCINE  10/02/2022   COVID-19 Vaccine (6 - 2023-24 season) 03/28/2023   Medicare Annual Wellness (AWV)  12/19/2023   DTaP/Tdap/Td (4 - Td or Tdap) 06/19/2024   Pneumonia Vaccine 28+ Years old  Completed   HPV VACCINES  Aged Out    Health Maintenance  Health Maintenance Due  Topic Date Due   Zoster Vaccines- Shingrix (1 of 2) Never done   INFLUENZA VACCINE  10/02/2022    Colorectal cancer screening: No longer required.   Lung Cancer Screening: (Low Dose CT Chest recommended if Age 78-80 years, 20 pack-year currently smoking OR have quit w/in 15years.) does not qualify.     Additional Screening:  Hepatitis C Screening: does not qualify; Completed no  Vision Screening: Recommended annual ophthalmology exams for early detection of glaucoma and other disorders of the eye. Is the  patient up to date with their annual eye exam?  Yes  Who is the provider or what is the name of the office in which the patient attends annual eye exams? [facility] If pt is not established with a provider, would they like to be referred to a provider to establish care? No .   Dental Screening: Recommended annual dental exams for proper oral hygiene   Community Resource Referral / Chronic Care Management: CRR required this visit?  No   CCM required this visit?  No     Plan:     I have personally reviewed and noted the following in the patient's chart:   Medical and social history Use of alcohol, tobacco or illicit drugs  Current medications and supplements including opioid prescriptions.  Patient is not currently taking opioid prescriptions. Functional ability and status Nutritional status Physical activity Advanced directives List of other physicians Hospitalizations, surgeries, and ER visits in previous 12 months Vitals Screenings to include cognitive, depression, and falls Referrals and appointments  In addition, I have reviewed and discussed with patient certain preventive protocols, quality metrics, and best practice recommendations. A written personalized care plan for preventive services as well as general preventive health recommendations were provided to patient.     Hal Hope, LPN   84/13/2440   After Visit Summary: (MyChart) Due to this being a telephonic visit, the after visit summary with patients personalized plan was offered to patient via MyChart   Nurse Notes: none

## 2022-12-25 ENCOUNTER — Telehealth: Payer: Self-pay | Admitting: *Deleted

## 2022-12-25 NOTE — Patient Instructions (Signed)
Visit Information  Thank you for taking time to visit with me today. Please don't hesitate to contact me if I can be of assistance to you.  Please call the care guide team at [phone] if you need to cancel or reschedule your appointment.   Please call the Suicide and Crisis Lifeline: 988 call the [place] National Suicide Prevention Lifeline: 623-054-2715 or TTY: 860-710-5079 TTY 267-797-0615) to talk to a trained counselor call 1-800-273-TALK (toll free, 24 hour hotline) call 911 if you are experiencing a Mental Health or Behavioral Health Crisis or need someone to talk to.  The patient verbalized understanding of instructions, educational materials, and care plan provided today and agreed to receive a mailed copy of patient instructions, educational materials, and care plan.   The patient has been provided with contact information for the care management team and has been advised to call with any health related questions or concerns.   Kemper Durie RN, MSN, CCM [facility], [facility] RN Care Coordinator Direct Dial: [phone] / Main [phone] Fax [phone] Email: [email or web address] Website: [email or web address]

## 2022-12-25 NOTE — Patient Outreach (Signed)
Care Coordination   Initial Visit Note   12/25/2022 Name: CELEDONIO LOUP MRN: [id number] DOB: [birth year]  Soyla Murphy Fede is a 88 y.o. year old male who sees Smitty Cords, DO for primary care. I spoke with  Soyla Murphy Bodenheimer by phone today.  What matters to the patients health and wellness today?  Patient report he is "excellent" with health management, does not feel follow up is needed at this time.  Denies any urgent concerns, encouraged to contact this care manager with questions.      Goals Addressed             This Visit's Progress    COMPLETED: Care Coordination Activities - no follow up needed       Interventions Today    Flowsheet Row Most Recent Value  Chronic Disease   Chronic disease during today's visit Atrial Fibrillation (AFib), Hypertension (HTN)  General Interventions   General Interventions Discussed/Reviewed General Interventions Reviewed, Vaccines, Doctor Visits  Vaccines Flu, COVID-19  [Does not get flu shots, but has received the Covid]  Doctor Visits Discussed/Reviewed Doctor Visits Reviewed, PCP, Annual Wellness Visits  [upcoming PCP 12/23]  PCP/Specialist Visits Compliance with follow-up visit  [AWV done 10/18]  Exercise Interventions   Exercise Discussed/Reviewed Physical Activity  Physical Activity Discussed/Reviewed Physical Activity Reviewed  [Does housework, yardwork, and carwork for exercise, also walks on a regular basis]  Education Interventions   Education Provided Provided Education  Provided Verbal Education On Medication, When to see the doctor  Pharmacy Interventions   Pharmacy Dicussed/Reviewed Pharmacy Topics Reviewed, Affording Medications, Medications and their functions              SDOH assessments and interventions completed:  Yes  SDOH Interventions Today    Flowsheet Row Most Recent Value  SDOH Interventions   Food Insecurity Interventions Intervention Not Indicated  Housing Interventions Intervention Not  Indicated  Transportation Interventions Intervention Not Indicated        Care Coordination Interventions:  Yes, provided   Follow up plan: No further intervention required.   Encounter Outcome:  Patient Visit Completed   Kemper Durie RN, MSN, CCM [place]  [facility], [facility] RN Care Coordinator Direct Dial: [phone] / Main [phone] Fax [phone] Email: [email or web address] Website: [email or web address]

## 2023-02-17 ENCOUNTER — Other Ambulatory Visit: Payer: Self-pay

## 2023-02-17 DIAGNOSIS — N138 Other obstructive and reflux uropathy: Secondary | ICD-10-CM

## 2023-02-17 DIAGNOSIS — Z0000 Encounter for general adult medical examination without abnormal findings: Secondary | ICD-10-CM

## 2023-02-17 DIAGNOSIS — I10 Essential (primary) hypertension: Secondary | ICD-10-CM

## 2023-02-17 DIAGNOSIS — E782 Mixed hyperlipidemia: Secondary | ICD-10-CM

## 2023-02-17 DIAGNOSIS — E669 Obesity, unspecified: Secondary | ICD-10-CM

## 2023-02-17 DIAGNOSIS — R7309 Other abnormal glucose: Secondary | ICD-10-CM

## 2023-02-18 ENCOUNTER — Other Ambulatory Visit: Payer: Medicare Other

## 2023-02-18 DIAGNOSIS — E782 Mixed hyperlipidemia: Secondary | ICD-10-CM | POA: Diagnosis not present

## 2023-02-18 DIAGNOSIS — R7309 Other abnormal glucose: Secondary | ICD-10-CM | POA: Diagnosis not present

## 2023-02-18 DIAGNOSIS — I10 Essential (primary) hypertension: Secondary | ICD-10-CM | POA: Diagnosis not present

## 2023-02-19 LAB — URINALYSIS, ROUTINE W REFLEX MICROSCOPIC
Bacteria, UA: NONE SEEN /[HPF]
Bilirubin Urine: NEGATIVE
Glucose, UA: NEGATIVE
Hgb urine dipstick: NEGATIVE
Hyaline Cast: NONE SEEN /[LPF]
Ketones, ur: NEGATIVE
Nitrite: NEGATIVE
Protein, ur: NEGATIVE
RBC / HPF: NONE SEEN /[HPF] (ref 0–2)
Specific Gravity, Urine: 1.016 (ref 1.001–1.035)
pH: 6 (ref 5.0–8.0)

## 2023-02-19 LAB — LIPID PANEL
Cholesterol: 142 mg/dL (ref <200)
HDL: 40 mg/dL (ref >=40)
LDL Cholesterol (Calc): 82 mg/dL
Non-HDL Cholesterol (Calc): 102 mg/dL (ref <130)
Total CHOL/HDL Ratio: 3.6 (calc) (ref <5.0)
Triglycerides: 108 mg/dL (ref <150)

## 2023-02-19 LAB — HEMOGLOBIN A1C
Hgb A1c MFr Bld: 5.8 %{Hb} — ABNORMAL HIGH (ref <5.7)
Mean Plasma Glucose: 120 mg/dL
eAG (mmol/L): 6.6 mmol/L

## 2023-02-19 LAB — CBC WITH DIFFERENTIAL/PLATELET
Absolute Lymphocytes: 2380 {cells}/uL (ref 850–3900)
Absolute Monocytes: 756 {cells}/uL (ref 200–950)
Basophils Absolute: 21 {cells}/uL (ref 0–200)
Basophils Relative: 0.3 %
Eosinophils Absolute: 154 {cells}/uL (ref 15–500)
Eosinophils Relative: 2.2 %
HCT: 34.5 % — ABNORMAL LOW (ref 38.5–50.0)
Hemoglobin: 11.3 g/dL — ABNORMAL LOW (ref 13.2–17.1)
MCH: 31 pg (ref 27.0–33.0)
MCHC: 32.8 g/dL (ref 32.0–36.0)
MCV: 94.5 fL (ref 80.0–100.0)
MPV: 10.9 fL (ref 7.5–12.5)
Monocytes Relative: 10.8 %
Neutro Abs: 3689 {cells}/uL (ref 1500–7800)
Neutrophils Relative %: 52.7 %
Platelets: 252 10*3/uL (ref 140–400)
RBC: 3.65 10*6/uL — ABNORMAL LOW (ref 4.20–5.80)
RDW: 12.7 % (ref 11.0–15.0)
Total Lymphocyte: 34 %
WBC: 7 10*3/uL (ref 3.8–10.8)

## 2023-02-19 LAB — COMPLETE METABOLIC PANEL WITH GFR
AG Ratio: 1.4 (calc) (ref 1.0–2.5)
ALT: 7 U/L — ABNORMAL LOW (ref 9–46)
AST: 11 U/L (ref 10–35)
Albumin: 3.7 g/dL (ref 3.6–5.1)
Alkaline phosphatase (APISO): 64 U/L (ref 35–144)
BUN: 11 mg/dL (ref 7–25)
CO2: 27 mmol/L (ref 20–32)
Calcium: 9.1 mg/dL (ref 8.6–10.3)
Chloride: 105 mmol/L (ref 98–110)
Creat: 0.72 mg/dL (ref 0.70–1.22)
Globulin: 2.6 g/dL (ref 1.9–3.7)
Glucose, Bld: 91 mg/dL (ref 65–99)
Potassium: 3.8 mmol/L (ref 3.5–5.3)
Sodium: 141 mmol/L (ref 135–146)
Total Bilirubin: 0.5 mg/dL (ref 0.2–1.2)
Total Protein: 6.3 g/dL (ref 6.1–8.1)
eGFR: 89 mL/min/{1.73_m2} (ref >=60)

## 2023-02-19 LAB — MICROSCOPIC MESSAGE

## 2023-02-19 LAB — PSA: PSA: 2.57 ng/mL (ref <=4.00)

## 2023-02-19 LAB — TSH: TSH: 3.5 m[IU]/L (ref 0.40–4.50)

## 2023-02-23 ENCOUNTER — Ambulatory Visit (INDEPENDENT_AMBULATORY_CARE_PROVIDER_SITE_OTHER): Payer: Medicare Other | Admitting: Family Medicine

## 2023-02-23 ENCOUNTER — Encounter: Payer: Self-pay | Admitting: Family Medicine

## 2023-02-23 VITALS — BP 148/60 | HR 62 | Ht 69.0 in | Wt 240.0 lb

## 2023-02-23 DIAGNOSIS — N401 Enlarged prostate with lower urinary tract symptoms: Secondary | ICD-10-CM | POA: Diagnosis not present

## 2023-02-23 DIAGNOSIS — N3000 Acute cystitis without hematuria: Secondary | ICD-10-CM

## 2023-02-23 DIAGNOSIS — J3089 Other allergic rhinitis: Secondary | ICD-10-CM | POA: Diagnosis not present

## 2023-02-23 DIAGNOSIS — Z23 Encounter for immunization: Secondary | ICD-10-CM

## 2023-02-23 DIAGNOSIS — I10 Essential (primary) hypertension: Secondary | ICD-10-CM | POA: Diagnosis not present

## 2023-02-23 DIAGNOSIS — N138 Other obstructive and reflux uropathy: Secondary | ICD-10-CM | POA: Diagnosis not present

## 2023-02-23 DIAGNOSIS — R7309 Other abnormal glucose: Secondary | ICD-10-CM

## 2023-02-23 DIAGNOSIS — E782 Mixed hyperlipidemia: Secondary | ICD-10-CM | POA: Diagnosis not present

## 2023-02-23 DIAGNOSIS — Z0000 Encounter for general adult medical examination without abnormal findings: Secondary | ICD-10-CM | POA: Diagnosis not present

## 2023-02-23 DIAGNOSIS — F5104 Psychophysiologic insomnia: Secondary | ICD-10-CM

## 2023-02-23 DIAGNOSIS — K219 Gastro-esophageal reflux disease without esophagitis: Secondary | ICD-10-CM

## 2023-02-23 MED ORDER — QUETIAPINE FUMARATE 50 MG PO TABS: 50.0000 mg | ORAL_TABLET | Freq: Every day | ORAL | 3 refills | Status: DC

## 2023-02-23 MED ORDER — FLUTICASONE PROPIONATE 50 MCG/ACT NA SUSP: NASAL | 1 refills | Status: AC

## 2023-02-23 MED ORDER — AMOXICILLIN-POT CLAVULANATE 875-125 MG PO TABS: 1.0000 | ORAL_TABLET | Freq: Two times a day (BID) | ORAL | 0 refills | Status: DC

## 2023-02-23 MED ORDER — OMEPRAZOLE 20 MG PO CPDR: 20.0000 mg | DELAYED_RELEASE_CAPSULE | Freq: Two times a day (BID) | ORAL | 3 refills | Status: DC

## 2023-02-23 MED ORDER — DOXAZOSIN MESYLATE 2 MG PO TABS: 2.0000 mg | ORAL_TABLET | Freq: Two times a day (BID) | ORAL | 3 refills | Status: DC

## 2023-02-23 MED ORDER — ATORVASTATIN CALCIUM 40 MG PO TABS: 40.0000 mg | ORAL_TABLET | Freq: Every day | ORAL | 3 refills | Status: DC

## 2023-02-23 NOTE — Progress Notes (Signed)
Subjective:    Patient ID: Erik Palmer, male    DOB: February 22, 1935, 88 y.o.   MRN: [id number]  Erik Palmer is a 88 y.o. male presenting on 02/23/2023 for Hypertension and Annual Exam   HPI  Discussed the use of AI scribe software for clinical note transcription with the patient, who gave verbal consent to proceed.  History of Present Illness     The patient, a veteran with a history of hypertension, presented for a routine follow-up visit. He reported a recent episode of COVID-19, which he believes he contracted during a visit to the [place] clinic. The patient described waking up one morning feeling as if he had swallowed his tongue, which he later identified as the onset of his COVID-19 symptoms. He was initially treated for an upper respiratory infection with amoxicillin, which he reported had no effect on his symptoms. Subsequent treatment with a Z-Pak reportedly improved his condition. - This is currently resolved.  The patient also reported a history of recurrent urinary tract infections (UTIs), which he described as often being accompanied by hematuria. He noted that these UTIs have been a long-standing issue, sometimes causing severe discomfort during urination. Interestingly, the patient reported that the remaining amoxicillin from his initial treatment for the upper respiratory infection seemed to effectively treat his most recent UTI. He requested a prescription for amoxicillin to have on hand for future UTIs.  The patient also mentioned a persistent dry mouth, which he attributed to his current medications. He expressed a desire to improve his urinary flow, which he described as frequently interrupted and often accompanied by a persistent urge to urinate. He also reported frequent nocturia. - He is on Doxazosin but prefers not to add other therapy  The patient's medication regimen was reviewed, and he reported taking all his medications as prescribed. He requested a refill of his  Flonase prescription, which he uses infrequently. He also expressed a desire to have all his medications refilled at the same time for convenience. The patient reported feeling generally well and noted that he feels better than he has in the past few years. He also mentioned that he is the eldest in his family and is looking forward to a family dinner.      CHRONIC HTN: Reports elevated BP here, and home readings avg 130/70s, usually improve on repeat reading Current Meds - Amlodipine 5mg  daily, Carvedilol 6.25mg  BID, Doxazosin 2mg  BID, Lisinopril 5mg  daily  (half of 10mg ) Reports good compliance, took meds today. Tolerating well, w/o complaints. Denies CP, dyspnea, HA, edema, dizziness / lightheadedness   Elevated A1c A1c 5.8 now. Similar to prior. A1c average 5.6 to 5.7  CBGs: not checking CBG regularly Meds: never on med Currently not on ACEi / ARB Lifestyle: - Diet (improved diet)  - Exercise (limited but he is improving exercise) Denies hypoglycemia   HYPERLIPIDEMIA: - Reports no concerns. Last lipid panel 02/2023, controlled LDL 80s - Currently taking Atorvastatin 40mg , tolerating well without side effects or myalgias     PMH - Cardiology / Permanent Atrial Fibrillation / HTN    BPH with LUTS Gross Hematuria [facility] Bladder [facility] and he is not retaining on PVR He saw Urologist they discussed OAB symptoms Off Vesicare, Oxybutynin and other On Doxazosin   Major Depression recurrent mild vs remission / Insomnia Taking Seroquel 50mg  nightly currently and doing well Wife died in Nov 20, 2023he has had some sad mood but overall not worsening depression. He is  managing with family support system    Urinalysis 02/18/23 showed Leukocyte +1, otherwise negative.    Health Maintenance:   Elevated PSA / Prostate CA Screening: PSA trend stable at 2.57, prior 2.3 to 2.6  COVID Vaccine updated at [place]. He developed COVID since that time.  He is interested in  Prevnar-20     12/19/2022   10:18 AM 08/19/2022   10:35 AM 02/13/2022   10:37 AM  Depression screen PHQ 2/9  Decreased Interest 0 0 0  Down, Depressed, Hopeless 1 0 0  PHQ - 2 Score 1 0 0  Altered sleeping 0 0 0  Tired, decreased energy 0 0 0  Change in appetite 0 0 0  Feeling bad or failure about yourself  0 0 0  Trouble concentrating 0 0 0  Moving slowly or fidgety/restless 0 0 0  Suicidal thoughts 0 0 0  PHQ-9 Score 1 0 0  Difficult doing work/chores Not difficult at all  Not difficult at all       08/19/2022   10:35 AM 02/13/2022   10:37 AM 08/03/2020    8:41 AM 07/21/2017    9:02 AM  GAD 7 : Generalized Anxiety Score  Nervous, Anxious, on Edge 0 0 0 0  Control/stop worrying 0 0 0 0  Worry too much - different things 0 0 0 0  Trouble relaxing 0 0 0 0  Restless 0 0 0 0  Easily annoyed or irritable 0 0 0 0  Afraid - awful might happen 0 0 0 0  Total GAD 7 Score 0 0 0 0  Anxiety Difficulty  Not difficult at all Not difficult at all Not difficult at all     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   Arthritis    GERD (gastroesophageal reflux disease)    Grief at loss of child 09/03/2014   Hypercholesteremia    Hypertension    Prostate enlargement    Past Surgical History:  Procedure Laterality Date   BROW LIFT Bilateral 11/14/2022   Procedure: BLEPHAROPLASTY UPPER EYELID; W/EXCESS SKIN BROW PTOSIS REPAIR BILATERAL;  Surgeon: Imagene Riches, MD;  Location: [facility];  Service: Ophthalmology;  Laterality: Bilateral;   CATARACT EXTRACTION W/PHACO Right 10/14/2021   Procedure: CATARACT EXTRACTION PHACO AND INTRAOCULAR LENS PLACEMENT (IOC) RIGHT 11.27 01:28.4;  Surgeon: Lockie Mola, MD;  Location: [facility];  Service: Ophthalmology;  Laterality: Right;   CATARACT EXTRACTION W/PHACO Left 10/30/2021   Procedure: CATARACT EXTRACTION PHACO AND INTRAOCULAR  LENS PLACEMENT (IOC) LEFT M[place]ugin 9.90 01:40.8;  Surgeon: Lockie Mola, MD;  Location: [facility];  Service: Ophthalmology;  Laterality: Left;   CORONARY ARTERY BYPASS GRAFT  03/25/2017   at [facility]   TOTAL HIP ARTHROPLASTY Right 12/05/2015   Procedure: TOTAL HIP ARTHROPLASTY ANTERIOR APPROACH;  Surgeon: Kennedy Bucker, MD;  Location: [facility];  Service: Orthopedics;  Laterality: Right;   Social History   Socioeconomic History   Marital status: Widowed    Spouse name: Not on file   Number of children: Not on file   Years of education: Not on file   Highest education level: Not on file  Occupational History   Occupation: retired  Tobacco Use   Smoking status: Never   Smokeless tobacco: Never  Vaping Use   Vaping status: Never Used  Substance and Sexual Activity   Alcohol use: No    Alcohol/week: 0.0  standard drinks of alcohol   Drug use: No   Sexual activity: Not Currently  Other Topics Concern   Not on file  Social History Narrative   Not on file   Social Drivers of Health   Financial Resource Strain: Low Risk  (12/19/2022)   Overall Financial Resource Strain (CARDIA)    Difficulty of Paying Living Expenses: Not hard at all  Food Insecurity: No Food Insecurity (12/25/2022)   Hunger Vital Sign    Worried About Running Out of Food in the Last Year: Never true    Ran Out of Food in the Last Year: Never true  Transportation Needs: No Transportation Needs (12/25/2022)   PRAPARE - Administrator, Civil Service (Medical): No    Lack of Transportation (Non-Medical): No  Physical Activity: Insufficiently Active (12/19/2022)   Exercise Vital Sign    Days of Exercise per Week: 3 days    Minutes of Exercise per Session: 30 min  Stress: No Stress Concern Present (12/19/2022)   [employer] of Occupational Health - Occupational Stress Questionnaire    Feeling of Stress : Not at all  Social Connections: Socially Isolated (12/19/2022)   Social Connection  and Isolation Panel [NHANES]    Frequency of Communication with Friends and Family: More than three times a week    Frequency of Social Gatherings with Friends and Family: Twice a week    Attends Religious Services: Never    Database administrator or Organizations: No    Attends Banker Meetings: Never    Marital Status: Widowed  Intimate Partner Violence: Not At Risk (12/19/2022)   Humiliation, Afraid, Rape, and Kick questionnaire    Fear of Current or Ex-Partner: No    Emotionally Abused: No    Physically Abused: No    Sexually Abused: No   Family History  Problem Relation Age of Onset   Hypertension Mother    Heart block Mother    Stroke Mother    Depression Mother    Cancer Father    Heart attack Sister    COPD Sister    Hypertension Brother    Hypertension Brother    Hypertension Brother    Hypertension Brother    Heart attack Brother    Diabetes Neg Hx    Current Outpatient Medications on File Prior to Visit  Medication Sig   acetaminophen (TYLENOL) 650 MG CR tablet Take 500 mg by mouth 2 (two) times daily. Reported on 05/08/2015   amLODipine (NORVASC) 5 MG tablet Take 1 tablet (5 mg total) by mouth daily.   apixaban (ELIQUIS) 5 MG TABS tablet Take 1 tablet (5 mg total) by mouth 2 (two) times daily.   carvedilol (COREG) 6.25 MG tablet Take 6.25 mg by mouth 2 (two) times daily with a meal.   lisinopril (ZESTRIL) 10 MG tablet TAKE ONE-HALF TABLET BY MOUTH EVERY DAY FOR BLOOD PRESSURE   erythromycin ophthalmic ointment Apply to sutures 4 times a day for 10-12 days.  Discontinue if allergy develops and call our office (Patient not taking: Reported on 02/23/2023)   traMADol (ULTRAM) 50 MG tablet Take 1 every 4-6 hours as needed for pain not controlled by Tylenol (Patient not taking: Reported on 02/23/2023)   No current facility-administered medications on file prior to visit.    Review of Systems  Constitutional:  Negative for activity change, appetite change,  chills, diaphoresis, fatigue and fever.  HENT:  Negative for congestion and hearing loss.   Eyes:  Negative  for visual disturbance.  Respiratory:  Negative for cough, chest tightness, shortness of breath and wheezing.   Cardiovascular:  Negative for chest pain, palpitations and leg swelling.  Gastrointestinal:  Negative for abdominal pain, constipation, diarrhea, nausea and vomiting.  Genitourinary:  Negative for dysuria, frequency and hematuria.  Musculoskeletal:  Negative for arthralgias and neck pain.  Skin:  Negative for rash.  Neurological:  Negative for dizziness, weakness, light-headedness, numbness and headaches.  Hematological:  Negative for adenopathy.  Psychiatric/Behavioral:  Negative for behavioral problems, dysphoric mood and sleep disturbance.    Per HPI unless specifically indicated above     Objective:    BP (!) 148/60 (BP Location: Left Arm, Cuff Size: Normal)   Pulse 62   Ht 5\' 9"  (1.753 m)   Wt 240 lb (108.9 kg)   SpO2 99%   BMI 35.44 kg/m   Wt Readings from Last 3 Encounters:  02/23/23 240 lb (108.9 kg)  11/14/22 220 lb (99.8 kg)  08/19/22 225 lb (102.1 kg)    Physical Exam Vitals and nursing note reviewed.  Constitutional:      General: He is not in acute distress.    Appearance: He is well-developed. He is not diaphoretic.     Comments: Well-appearing, comfortable, cooperative  HENT:     Head: Normocephalic and atraumatic.  Eyes:     General:        Right eye: No discharge.        Left eye: No discharge.     Conjunctiva/sclera: Conjunctivae normal.     Pupils: Pupils are equal, round, and reactive to light.  Neck:     Thyroid: No thyromegaly.     Vascular: No carotid bruit.  Cardiovascular:     Rate and Rhythm: Normal rate and regular rhythm.     Pulses: Normal pulses.     Heart sounds: Normal heart sounds. No murmur heard. Pulmonary:     Effort: Pulmonary effort is normal. No respiratory distress.     Breath sounds: Normal breath sounds. No  wheezing or rales.  Abdominal:     General: Bowel sounds are normal. There is no distension.     Palpations: Abdomen is soft. There is no mass.     Tenderness: There is no abdominal tenderness.  Musculoskeletal:        General: No tenderness. Normal range of motion.     Cervical back: Normal range of motion and neck supple.     Right lower leg: No edema.     Left lower leg: No edema.     Comments: Upper / Lower Extremities: - Normal muscle tone, strength bilateral upper extremities 5/5, lower extremities 5/5  Lymphadenopathy:     Cervical: No cervical adenopathy.  Skin:    General: Skin is warm and dry.     Findings: No erythema or rash.  Neurological:     Mental Status: He is alert and oriented to person, place, and time.     Comments: Distal sensation intact to light touch all extremities  Psychiatric:        Mood and Affect: Mood normal.        Behavior: Behavior normal.        Thought Content: Thought content normal.     Comments: Well groomed, good eye contact, normal speech and thoughts     Results for orders placed or performed in visit on 02/17/23  MICROSCOPIC MESSAGE   Collection Time: 02/18/23  8:24 AM  Result Value Ref Range  Note    Urinalysis, Routine w reflex microscopic   Collection Time: 02/18/23  8:24 AM  Result Value Ref Range   Color, Urine YELLOW YELLOW   APPearance CLEAR CLEAR   Specific Gravity, Urine 1.016 1.001 - 1.035   pH 6.0 5.0 - 8.0   Glucose, UA NEGATIVE NEGATIVE   Bilirubin Urine NEGATIVE NEGATIVE   Ketones, ur NEGATIVE NEGATIVE   Hgb urine dipstick NEGATIVE NEGATIVE   Protein, ur NEGATIVE NEGATIVE   Nitrite NEGATIVE NEGATIVE   Leukocytes,Ua 1+ (A) NEGATIVE   WBC, UA 0-5 0 - 5 /HPF   RBC / HPF NONE SEEN 0 - 2 /HPF   Squamous Epithelial / HPF 6-10 (A) < OR = 5 /HPF   Bacteria, UA NONE SEEN NONE SEEN /HPF   Hyaline Cast NONE SEEN NONE SEEN /LPF  TSH   Collection Time: 02/18/23  8:24 AM  Result Value Ref Range   TSH 3.50 0.40 - 4.50  mIU/L  PSA   Collection Time: 02/18/23  8:24 AM  Result Value Ref Range   PSA 2.57 < OR = 4.00 ng/mL  Lipid panel   Collection Time: 02/18/23  8:24 AM  Result Value Ref Range   Cholesterol 142 <200 mg/dL   HDL 40 > OR = 40 mg/dL   Triglycerides 161 <096 mg/dL   LDL Cholesterol (Calc) 82 mg/dL (calc)   Total CHOL/HDL Ratio 3.6 <5.0 (calc)   Non-HDL Cholesterol (Calc) 102 <130 mg/dL (calc)  Hemoglobin E4V   Collection Time: 02/18/23  8:24 AM  Result Value Ref Range   Hgb A1c MFr Bld 5.8 (H) <5.7 % of total Hgb   Mean Plasma Glucose 120 mg/dL   eAG (mmol/L) 6.6 mmol/L  CBC with Differential/Platelet   Collection Time: 02/18/23  8:24 AM  Result Value Ref Range   WBC 7.0 3.8 - 10.8 Thousand/uL   RBC 3.65 (L) 4.20 - 5.80 Million/uL   Hemoglobin 11.3 (L) 13.2 - 17.1 g/dL   HCT 40.9 (L) 81.1 - 91.4 %   MCV 94.5 80.0 - 100.0 fL   MCH 31.0 27.0 - 33.0 pg   MCHC 32.8 32.0 - 36.0 g/dL   RDW 78.2 95.6 - 21.3 %   Platelets 252 140 - 400 Thousand/uL   MPV 10.9 7.5 - 12.5 fL   Neutro Abs 3,689 1,500 - 7,800 cells/uL   Absolute Lymphocytes 2,380 850 - 3,900 cells/uL   Absolute Monocytes 756 200 - 950 cells/uL   Eosinophils Absolute 154 15 - 500 cells/uL   Basophils Absolute 21 0 - 200 cells/uL   Neutrophils Relative % 52.7 %   Total Lymphocyte 34.0 %   Monocytes Relative 10.8 %   Eosinophils Relative 2.2 %   Basophils Relative 0.3 %  COMPLETE METABOLIC PANEL WITH GFR   Collection Time: 02/18/23  8:24 AM  Result Value Ref Range   Glucose, Bld 91 65 - 99 mg/dL   BUN 11 7 - 25 mg/dL   Creat 0.86 5.78 - 4.69 mg/dL   eGFR 89 > OR = 60 GE/XBM/8.41L2   BUN/Creatinine Ratio SEE NOTE: 6 - 22 (calc)   Sodium 141 135 - 146 mmol/L   Potassium 3.8 3.5 - 5.3 mmol/L   Chloride 105 98 - 110 mmol/L   CO2 27 20 - 32 mmol/L   Calcium 9.1 8.6 - 10.3 mg/dL   Total Protein 6.3 6.1 - 8.1 g/dL   Albumin 3.7 3.6 - 5.1 g/dL   Globulin 2.6 1.9 - 3.7 g/dL (calc)  AG Ratio 1.4 1.0 - 2.5 (calc)   Total  Bilirubin 0.5 0.2 - 1.2 mg/dL   Alkaline phosphatase (APISO) 64 35 - 144 U/L   AST 11 10 - 35 U/L   ALT 7 (L) 9 - 46 U/L      Assessment & Plan:   Problem List Items Addressed This Visit     BPH with obstruction/lower urinary tract symptoms   Relevant Medications   doxazosin (CARDURA) 2 MG tablet   Elevated hemoglobin A1c   Essential hypertension   Relevant Medications   atorvastatin (LIPITOR) 40 MG tablet   doxazosin (CARDURA) 2 MG tablet   Hyperlipidemia   Relevant Medications   atorvastatin (LIPITOR) 40 MG tablet   doxazosin (CARDURA) 2 MG tablet   Insomnia   Relevant Medications   QUEtiapine (SEROQUEL) 50 MG tablet   Other Visit Diagnoses       Annual physical exam    -  Primary     Need for Streptococcus pneumoniae vaccination       Relevant Orders   Pneumococcal conjugate vaccine 20-valent (Completed)     Acute cystitis without hematuria       Relevant Medications   amoxicillin-clavulanate (AUGMENTIN) 875-125 MG tablet     Seasonal allergic rhinitis due to other allergic trigger       Relevant Medications   fluticasone (FLONASE) 50 MCG/ACT nasal spray     Gastroesophageal reflux disease, unspecified whether esophagitis present       Relevant Medications   omeprazole (PRILOSEC) 20 MG capsule        Updated Health Maintenance information Reviewed recent lab results with patient Encouraged improvement to lifestyle with diet and exercise Goal of weight loss   Recurrent Urinary Tract Infections Reports frequent UTIs with hematuria and dysuria. Recent improvement with Amoxicillin. - Prescribe Amoxicillin for future UTIs. - Consider urology referral if infections persist.  Hypertension Reports inconsistent blood pressure readings, with some improvement noted during the visit. - Continue current antihypertensive medications. - Monitor blood pressure regularly. F/u with Cardiology if indicated  Prediabetes Stable HbA1c at 5.8, despite reported high sugar  intake. - Continue current diet and lifestyle modifications. - Monitor HbA1c regularly.  Hyperlipidemia Improved LDL cholesterol levels. - Continue current lipid-lowering medications. - Monitor lipid profile regularly.  BPH, suspected Mostly controlled on alpha blocker  General Health Maintenance - Administer pneumonia vaccine today. - Continue Flonase as needed for respiratory symptoms. - Follow-up in six months for routine care.         Orders Placed This Encounter  Procedures   Pneumococcal conjugate vaccine 20-valent    Meds ordered this encounter  Medications   amoxicillin-clavulanate (AUGMENTIN) 875-125 MG tablet    Sig: Take 1 tablet by mouth 2 (two) times daily.    Dispense:  14 tablet    Refill:  0    For 7 days, UTI   fluticasone (FLONASE) 50 MCG/ACT nasal spray    Sig: SPRAY 2 SPRAYS INTO EACH NOSTRIL EVERY DAY    Dispense:  48 mL    Refill:  1   atorvastatin (LIPITOR) 40 MG tablet    Sig: Take 1 tablet (40 mg total) by mouth daily.    Dispense:  90 tablet    Refill:  3    Add refills on file   omeprazole (PRILOSEC) 20 MG capsule    Sig: Take 1 capsule (20 mg total) by mouth 2 (two) times daily.    Dispense:  180 capsule    Refill:  3   QUEtiapine (SEROQUEL) 50 MG tablet    Sig: Take 1 tablet (50 mg total) by mouth at bedtime.    Dispense:  90 tablet    Refill:  3    Keep on hold or refills on file until ready   doxazosin (CARDURA) 2 MG tablet    Sig: Take 1 tablet (2 mg total) by mouth 2 (two) times daily.    Dispense:  180 tablet    Refill:  3    Add refills on file     Follow up plan: Return in about 6 months (around 08/24/2023) for 6 month PreDM A1c HTN.  Saralyn Pilar, DO [facility] 02/23/2023, 10:43 AM

## 2023-02-23 NOTE — Patient Instructions (Addendum)
Thank you for coming to the office today.  Antibiotic Augmentin for back up plan for future Urinary Tract Infection  Refilled medicines today.  Lab results look great. Some results best in 2-3 years.   Please schedule a Follow-up Appointment to: Return in about 6 months (around 08/24/2023) for 6 month PreDM A1c.  If you have any other questions or concerns, please feel free to call the office or send a message through MyChart. You may also schedule an earlier appointment if necessary.  Additionally, you may be receiving a survey about your experience at our office within a few days to 1 week by e-mail or mail. We value your feedback.  Saralyn Pilar, DO [facility], [place]

## 2023-07-21 DIAGNOSIS — H353131 Nonexudative age-related macular degeneration, bilateral, early dry stage: Secondary | ICD-10-CM | POA: Diagnosis not present

## 2023-07-21 DIAGNOSIS — H35372 Puckering of macula, left eye: Secondary | ICD-10-CM | POA: Diagnosis not present

## 2023-07-21 DIAGNOSIS — Z961 Presence of intraocular lens: Secondary | ICD-10-CM | POA: Diagnosis not present

## 2023-07-21 DIAGNOSIS — H43813 Vitreous degeneration, bilateral: Secondary | ICD-10-CM | POA: Diagnosis not present

## 2023-08-26 ENCOUNTER — Ambulatory Visit: Payer: Self-pay | Admitting: Family Medicine

## 2023-09-10 ENCOUNTER — Ambulatory Visit: Admitting: Family Medicine

## 2023-09-10 ENCOUNTER — Other Ambulatory Visit: Payer: Self-pay | Admitting: Family Medicine

## 2023-09-10 ENCOUNTER — Encounter: Payer: Self-pay | Admitting: Family Medicine

## 2023-09-10 VITALS — BP 130/78 | HR 67 | Ht 69.0 in | Wt 231.6 lb

## 2023-09-10 DIAGNOSIS — N138 Other obstructive and reflux uropathy: Secondary | ICD-10-CM

## 2023-09-10 DIAGNOSIS — I4821 Permanent atrial fibrillation: Secondary | ICD-10-CM

## 2023-09-10 DIAGNOSIS — Z0000 Encounter for general adult medical examination without abnormal findings: Secondary | ICD-10-CM

## 2023-09-10 DIAGNOSIS — E782 Mixed hyperlipidemia: Secondary | ICD-10-CM

## 2023-09-10 DIAGNOSIS — I10 Essential (primary) hypertension: Secondary | ICD-10-CM

## 2023-09-10 DIAGNOSIS — R7309 Other abnormal glucose: Secondary | ICD-10-CM

## 2023-09-10 DIAGNOSIS — N401 Enlarged prostate with lower urinary tract symptoms: Secondary | ICD-10-CM

## 2023-09-10 LAB — POCT GLYCOSYLATED HEMOGLOBIN (HGB A1C): Hemoglobin A1C: 6 % — AB (ref 4.0–5.6)

## 2023-09-10 MED ORDER — AMLODIPINE BESYLATE 5 MG PO TABS: 5.0000 mg | ORAL_TABLET | Freq: Every day | ORAL | 1 refills | Status: DC

## 2023-09-10 NOTE — Patient Instructions (Addendum)
 Thank you for coming to the office today.  Refilled Amlodipine   Keep current medications.  Recent Labs    02/18/23 0824 09/10/23 0817  HGBA1C 5.8* 6.0*    Keep up the great work.   Please schedule a Follow-up Appointment to: Return in about 5 months (around 02/10/2024) for 5 month fasting lab > 1 week later Annual Physical.  If you have any other questions or concerns, please feel free to call the office or send a message through MyChart. You may also schedule an earlier appointment if necessary.  Additionally, you may be receiving a survey about your experience at our office within a few days to 1 week by e-mail or mail. We value your feedback.  Marsa Officer, DO [facility], [place]

## 2023-09-10 NOTE — Progress Notes (Signed)
 Subjective:    Patient ID: Erik Palmer, male    DOB: March 31, 1934, 89 y.o.   MRN: [id number]  Erik Palmer is a 89 y.o. male presenting on 09/10/2023 for Medical Management of Chronic Issues   HPI  Discussed the use of AI scribe software for clinical note transcription with the patient, who gave verbal consent to proceed.  History of Present Illness   Erik Palmer is an 89 year old male who presents for routine follow-up.   Analgesic use and adverse effects - Currently taking two Aleve  daily for pain management - Previously discontinued Aleve  due to eye irritation, which resolved after stopping the medication - Resumed Aleve  three days ago without recurrence of eye irritation - Previously stopped Tylenol   Peripheral edema - Experiences intermittent fluid retention in feet and ankles - Edema varies throughout the day, worsening by evening - Minimal swelling in the morning, described as 'like crow feet'   CHRONIC HTN: Reports elevated BP here, and home readings avg 130/70s, usually improve on repeat reading Current Meds - Amlodipine  5mg  daily, Carvedilol 6.25mg  BID, Doxazosin  2mg  BID, Lisinopril  5mg  daily  (half of 10mg ) Reports good compliance, took meds today. Tolerating well, w/o complaints. Denies CP, dyspnea, HA, edema, dizziness / lightheadedness   Elevated A1c A1c 6.0 now. Similar to prior. A1c average 5.6 to 5.8  CBGs: not checking CBG regularly Meds: never on med Currently not on ACEi / ARB Lifestyle: - Diet (improved diet)  - Exercise (limited but he is improving exercise) Denies hypoglycemia   HYPERLIPIDEMIA: - Reports no concerns. Last lipid panel 02/2023, controlled LDL 80s - Currently taking Atorvastatin  40mg , tolerating well without side effects or myalgias     PMH - Cardiology / Permanent Atrial Fibrillation / HTN    BPH with LUTS Gross Hematuria UTI [facility] ordered [facility] Bladder Scan and he is not retaining on PVR He saw Urologist they  discussed OAB symptoms Off Vesicare, Oxybutynin  and other On Doxazosin    Major Depression recurrent mild vs remission / Insomnia Taking Seroquel  50mg  nightly currently and doing well Wife died in 2023/11/17he has had some sad mood but overall not worsening depression. He is managing with family support system      12/19/2022   10:18 AM 08/19/2022   10:35 AM 02/13/2022   10:37 AM  Depression screen PHQ 2/9  Decreased Interest 0 0 0  Down, Depressed, Hopeless 1 0 0  PHQ - 2 Score 1 0 0  Altered sleeping 0 0 0  Tired, decreased energy 0 0 0  Change in appetite 0 0 0  Feeling bad or failure about yourself  0 0 0  Trouble concentrating 0 0 0  Moving slowly or fidgety/restless 0 0 0  Suicidal thoughts 0 0 0  PHQ-9 Score 1 0 0  Difficult doing work/chores Not difficult at all  Not difficult at all       08/19/2022   10:35 AM 02/13/2022   10:37 AM 08/03/2020    8:41 AM 07/21/2017    9:02 AM  GAD 7 : Generalized Anxiety Score  Nervous, Anxious, on Edge 0 0 0 0  Control/stop worrying 0 0 0 0  Worry too much - different things 0 0 0 0  Trouble relaxing 0 0 0 0  Restless 0 0 0 0  Easily annoyed or irritable 0 0 0 0  Afraid - awful might happen 0 0 0 0  Total GAD 7 Score 0 0 0 0  Anxiety Difficulty  Not difficult at all Not difficult at all Not difficult at all    Social History   Tobacco Use   Smoking status: Never   Smokeless tobacco: Never  Vaping Use   Vaping status: Never Used  Substance Use Topics   Alcohol use: No    Alcohol/week: 0.0 standard drinks of alcohol   Drug use: No    Review of Systems Per HPI unless specifically indicated above     Objective:    BP 130/78 (BP Location: Right Arm, Patient Position: Sitting, Cuff Size: Normal)   Pulse 67   Ht 5' 9 (1.753 m)   Wt 231 lb 9.6 oz (105.1 kg)   SpO2 97%   BMI 34.20 kg/m   Wt Readings from Last 3 Encounters:  09/10/23 231 lb 9.6 oz (105.1 kg)  02/23/23 240 lb (108.9 kg)  11/14/22 220 lb (99.8  kg)    Physical Exam Vitals and nursing note reviewed.  Constitutional:      General: He is not in acute distress.    Appearance: He is well-developed. He is not diaphoretic.     Comments: Well-appearing, comfortable, cooperative  HENT:     Head: Normocephalic and atraumatic.  Eyes:     General:        Right eye: No discharge.        Left eye: No discharge.     Conjunctiva/sclera: Conjunctivae normal.  Neck:     Thyroid : No thyromegaly.  Cardiovascular:     Rate and Rhythm: Normal rate and regular rhythm.     Pulses: Normal pulses.     Heart sounds: Normal heart sounds. No murmur heard. Pulmonary:     Effort: Pulmonary effort is normal. No respiratory distress.     Breath sounds: Normal breath sounds. No wheezing or rales.  Musculoskeletal:        General: Normal range of motion.     Cervical back: Normal range of motion and neck supple.  Lymphadenopathy:     Cervical: No cervical adenopathy.  Skin:    General: Skin is warm and dry.     Findings: No erythema or rash.  Neurological:     Mental Status: He is alert and oriented to person, place, and time. Mental status is at baseline.  Psychiatric:        Behavior: Behavior normal.     Comments: Well groomed, good eye contact, normal speech and thoughts     Results for orders placed or performed in visit on 09/10/23  POCT glycosylated hemoglobin (Hb A1C)   Collection Time: 09/10/23  8:17 AM  Result Value Ref Range   Hemoglobin A1C 6.0 (A) 4.0 - 5.6 %   HbA1c POC (<> result, manual entry)     HbA1c, POC (prediabetic range)     HbA1c, POC (controlled diabetic range)        Assessment & Plan:   Problem List Items Addressed This Visit     Atrial fibrillation (HCC)   Relevant Medications   amLODipine  (NORVASC ) 5 MG tablet   BPH with obstruction/lower urinary tract symptoms   Elevated hemoglobin A1c - Primary   Relevant Orders   POCT glycosylated hemoglobin (Hb A1C) (Completed)   Essential hypertension   Relevant  Medications   amLODipine  (NORVASC ) 5 MG tablet   Hyperlipidemia   Relevant Medications   amLODipine  (NORVASC ) 5 MG tablet     Prediabetes A1c 6.0. Blood glucose levels indicate prediabetes. Monitoring to prevent diabetes progression. Not on therapy  Hypertension Blood pressure controlled with current medication. - Refill antihypertensive medication.  Chronic Pain Management Pain managed with Aleve . Discussed interaction with anticoagulants, increasing bleeding risk. Advised caution with injuries or falls. - Monitor for signs of bleeding or bruising due to Aleve  and anticoagulant interaction. Recommend Tylenol  AS NEEDED or other medication options  General Health Maintenance Routine blood work and urinalysis conducted annually. Vaccinations up to date. - Schedule blood work and urinalysis for early December.  Follow-up Regular cardiology follow-up in September. Primary care follow-up in December for routine labs. - Schedule follow-up appointment for December. - Provide paper copy of lab results from last visit.      [facility]   Orders Placed This Encounter  Procedures   POCT glycosylated hemoglobin (Hb A1C)    Associate with Z13.1    Meds ordered this encounter  Medications   amLODipine  (NORVASC ) 5 MG tablet    Sig: Take 1 tablet (5 mg total) by mouth daily.    Dispense:  90 tablet    Refill:  1    Add refills for future    Follow up plan: Return in about 5 months (around 02/10/2024) for 5 month fasting lab > 1 week later Annual Physical.  Future labs ordered for 02/2024  Labs and Urinalysis 5 months   Erik Officer, DO [facility] 09/10/2023, 8:31 AM

## 2023-10-31 ENCOUNTER — Other Ambulatory Visit: Payer: Self-pay

## 2023-10-31 ENCOUNTER — Emergency Department
Admission: EM | Admit: 2023-10-31 | Discharge: 2023-10-31 | Disposition: A | Discharge status: Home / Self Care | Attending: Emergency Medicine | Admitting: Emergency Medicine

## 2023-10-31 ENCOUNTER — Emergency Department

## 2023-10-31 ENCOUNTER — Encounter: Payer: Self-pay | Admitting: Intensive Care

## 2023-10-31 DIAGNOSIS — Z7901 Long term (current) use of anticoagulants: Secondary | ICD-10-CM | POA: Diagnosis not present

## 2023-10-31 DIAGNOSIS — M7989 Other specified soft tissue disorders: Secondary | ICD-10-CM | POA: Insufficient documentation

## 2023-10-31 DIAGNOSIS — R0602 Shortness of breath: Secondary | ICD-10-CM | POA: Diagnosis not present

## 2023-10-31 DIAGNOSIS — I10 Essential (primary) hypertension: Secondary | ICD-10-CM | POA: Diagnosis not present

## 2023-10-31 DIAGNOSIS — Z8673 Personal history of transient ischemic attack (TIA), and cerebral infarction without residual deficits: Secondary | ICD-10-CM | POA: Diagnosis not present

## 2023-10-31 DIAGNOSIS — I4891 Unspecified atrial fibrillation: Secondary | ICD-10-CM | POA: Insufficient documentation

## 2023-10-31 DIAGNOSIS — R519 Headache, unspecified: Secondary | ICD-10-CM | POA: Diagnosis not present

## 2023-10-31 DIAGNOSIS — I6381 Other cerebral infarction due to occlusion or stenosis of small artery: Secondary | ICD-10-CM | POA: Diagnosis not present

## 2023-10-31 DIAGNOSIS — R110 Nausea: Secondary | ICD-10-CM | POA: Diagnosis not present

## 2023-10-31 DIAGNOSIS — I771 Stricture of artery: Secondary | ICD-10-CM | POA: Diagnosis not present

## 2023-10-31 DIAGNOSIS — R42 Dizziness and giddiness: Secondary | ICD-10-CM | POA: Insufficient documentation

## 2023-10-31 LAB — CBC WITH DIFFERENTIAL/PLATELET
Abs Immature Granulocytes: 0.03 K/uL (ref 0.00–0.07)
Basophils Absolute: 0 K/uL (ref 0.0–0.1)
Basophils Relative: 0 %
Eosinophils Absolute: 0.2 K/uL (ref 0.0–0.5)
Eosinophils Relative: 2 %
HCT: 35.2 % — ABNORMAL LOW (ref 39.0–52.0)
Hemoglobin: 11.9 g/dL — ABNORMAL LOW (ref 13.0–17.0)
Immature Granulocytes: 0 %
Lymphocytes Relative: 19 %
Lymphs Abs: 1.9 K/uL (ref 0.7–4.0)
MCH: 31.9 pg (ref 26.0–34.0)
MCHC: 33.8 g/dL (ref 30.0–36.0)
MCV: 94.4 fL (ref 80.0–100.0)
Monocytes Absolute: 0.7 K/uL (ref 0.1–1.0)
Monocytes Relative: 7 %
Neutro Abs: 7.2 K/uL (ref 1.7–7.7)
Neutrophils Relative %: 72 %
Platelets: 256 K/uL (ref 150–400)
RBC: 3.73 MIL/uL — ABNORMAL LOW (ref 4.22–5.81)
RDW: 13.3 % (ref 11.5–15.5)
WBC: 9.9 K/uL (ref 4.0–10.5)
nRBC: 0 % (ref 0.0–0.2)

## 2023-10-31 LAB — COMPREHENSIVE METABOLIC PANEL WITH GFR
ALT: 14 U/L (ref 0–44)
AST: 21 U/L (ref 15–41)
Albumin: 4 g/dL (ref 3.5–5.0)
Alkaline Phosphatase: 54 U/L (ref 38–126)
Anion gap: 9 (ref 5–15)
BUN: 12 mg/dL (ref 8–23)
CO2: 25 mmol/L (ref 22–32)
Calcium: 9.1 mg/dL (ref 8.9–10.3)
Chloride: 105 mmol/L (ref 98–111)
Creatinine, Ser: 0.76 mg/dL (ref 0.61–1.24)
GFR, Estimated: >60 mL/min (ref >60)
Glucose, Bld: 100 mg/dL — ABNORMAL HIGH (ref 70–99)
Potassium: 3.8 mmol/L (ref 3.5–5.1)
Sodium: 139 mmol/L (ref 135–145)
Total Bilirubin: 0.7 mg/dL (ref 0.0–1.2)
Total Protein: 6.7 g/dL (ref 6.5–8.1)

## 2023-10-31 LAB — URINALYSIS, W/ REFLEX TO CULTURE (INFECTION SUSPECTED)
Bacteria, UA: NONE SEEN
Bilirubin Urine: NEGATIVE
Glucose, UA: NEGATIVE mg/dL
Hgb urine dipstick: NEGATIVE
Ketones, ur: NEGATIVE mg/dL
Leukocytes,Ua: NEGATIVE
Nitrite: NEGATIVE
Protein, ur: NEGATIVE mg/dL
Specific Gravity, Urine: 1.004 — ABNORMAL LOW (ref 1.005–1.030)
pH: 7 (ref 5.0–8.0)

## 2023-10-31 LAB — PROTIME-INR
INR: 1.5 — ABNORMAL HIGH (ref 0.8–1.2)
Prothrombin Time: 19 s — ABNORMAL HIGH (ref 11.4–15.2)

## 2023-10-31 LAB — BRAIN NATRIURETIC PEPTIDE: B Natriuretic Peptide: 186.9 pg/mL — ABNORMAL HIGH (ref 0.0–100.0)

## 2023-10-31 LAB — TROPONIN I (HIGH SENSITIVITY)
Troponin I (High Sensitivity): 7 ng/L (ref <18)
Troponin I (High Sensitivity): 7 ng/L (ref <18)

## 2023-10-31 NOTE — ED Notes (Signed)
 Pt in MRI.

## 2023-10-31 NOTE — ED Provider Notes (Signed)
 [facility] Provider Note    Event Date/Time   First MD Initiated Contact with Patient 10/31/23 1008     (approximate)   History   No chief complaint on file.   HPI  Erik Palmer is a 89 y.o. male with history of GERD, hypertension, hyperlipidemia, A-fib on Eliquis , presenting with lightheadedness and swelling to the top of his left hand.  Patient states that he woke up at around 7:00 today, noted some intermittent lightheadedness, some nausea.  Was concerned that his left hand was slightly swollen.  He denies any trauma or falls, no weakness or numbness, able to fully range his hand, denies any bug bites or redness to the hand.  Patient states that he went to [facility], seen by his doctor and sent here for evaluation.  He denies any chest pain or shortness of breath, no fever or headache, no room spinning symptoms, no vomiting or diarrhea, no urinary symptoms.  He denies any cough.  No prior history of strokes.  Has been compliant with his medications.  He denies any confusion.  States that he noted some mild blurry vision earlier but thinks is related to not wearing his glasses.  Denies any blurry vision right now.  Per independent issue from daughter, patient is at his mental baseline, no confusion, no report of trauma or falls, no infectious symptoms.   On independent review, he was seen at [facility] today, noted swelling to the left hand that started this morning.  Also noted to have an elevated blood pressure today, does have history of chronic shortness of breath, also noted blurry vision with lightheadedness.     Physical Exam   Triage Vital Signs: ED Triage Vitals  Encounter Vitals Group     BP 10/31/23 1008 (!) 166/78     Girls Systolic BP Percentile --      Girls Diastolic BP Percentile --      Boys Systolic BP Percentile --      Boys Diastolic BP Percentile --      Pulse Rate 10/31/23 1008 76     Resp 10/31/23 1008 16     Temp  10/31/23 1008 97.8 F (36.6 C)     Temp Source 10/31/23 1008 Oral     SpO2 10/31/23 1008 98 %     Weight 10/31/23 1005 231 lb (104.8 kg)     Height 10/31/23 1005 5' 9 (1.753 m)     Head Circumference --      Peak Flow --      Pain Score 10/31/23 1005 0     Pain Loc --      Pain Education --      Exclude from Growth Chart --     Most recent vital signs: Vitals:   10/31/23 1100 10/31/23 1224  BP: (!) 162/90 (!) 176/87  Pulse: 88 75  Resp:  17  Temp:    SpO2: 100% 100%     General: Awake, no distress.  CV:  Good peripheral perfusion.  Resp:  Normal effort.  No tachypnea or respiratory distress Abd:  No distention.  Soft nontender Other:  He is nontoxic-appearing, pupils are equal and reactive, extraocular movements are intact, no conjunctival injection, no cranial nerve deficits, no focal weakness or numbness, he has steady gait with ambulation.  He has bilateral lower extremity edema that appears symmetrical, he has very mild edema to the dorsum of his left hand, full range of motion of his  extremities are intact including his hand.  He is no tenderness to palpation to the left upper extremity, there is no swelling to the rest of his left upper extremity.  Cap refill is intact.  There is no erythema, fluctuance, induration to his hand.  Equal radial pulses bilaterally.  Dry oral mucosa.   ED Results / Procedures / Treatments   Labs (all labs ordered are listed, but only abnormal results are displayed) Labs Reviewed  COMPREHENSIVE METABOLIC PANEL WITH GFR - Abnormal; Notable for the following components:      Result Value   Glucose, Bld 100 (*)    All other components within normal limits  CBC WITH DIFFERENTIAL/PLATELET - Abnormal; Notable for the following components:   RBC 3.73 (*)    Hemoglobin 11.9 (*)    HCT 35.2 (*)    All other components within normal limits  BRAIN NATRIURETIC PEPTIDE - Abnormal; Notable for the following components:   B Natriuretic Peptide 186.9  (*)    All other components within normal limits  PROTIME-INR - Abnormal; Notable for the following components:   Prothrombin Time 19.0 (*)    INR 1.5 (*)    All other components within normal limits  URINALYSIS, W/ REFLEX TO CULTURE (INFECTION SUSPECTED) - Abnormal; Notable for the following components:   Color, Urine STRAW (*)    APPearance CLEAR (*)    Specific Gravity, Urine 1.004 (*)    All other components within normal limits  TROPONIN I (HIGH SENSITIVITY)  TROPONIN I (HIGH SENSITIVITY)     EKG  EKG shows, sinus rhythm, rate 79, normal QS, normal QTc, no obvious ischemic ST elevation, T wave flattening in 3, not significantly compared to prior   RADIOLOGY On my independent interpretation, CT head without obvious intracranial hemorrhage   PROCEDURES:  Critical Care performed: No  Procedures   MEDICATIONS ORDERED IN ED: Medications - No data to display   IMPRESSION / MDM / ASSESSMENT AND PLAN / ED COURSE  I reviewed the triage vital signs and the nursing notes.                              Differential diagnosis includes, but is not limited to, electrolyte derangements, his oral mucosa is dry, considered mild intravascular dehydration, atypical ACS, arrhythmia, UTI.  He does not have any focal deficits on exam to suggest CVA.  For his transient blurry vision, did consider intracranial hemorrhage although this is less likely.  Patient also noted that he thinks his blurry vision earlier was related to him not wearing corrective lenses.  For his hand swelling, there is no evidence of infection, suspect probably some lymphedema there, does not extend past his hand, doubt DVT, no trauma or falls suggest fracture.  No pain to suggest gout or other arthritis.  Will proceed with CT head, labs, EKG, troponin, UA, give him little bit p.o. fluids here.  Reassess.  Shared decision making done with patient and daughter and they are agreeable with this plan.  Patient's presentation  is most consistent with acute presentation with potential threat to life or bodily function.  Independent interpretation of labs and imaging below.  Clinical course as below.  On reassessment patient is feeling a lot better, asymptomatic at this time.  Discussed with patient and family about imaging and lab results including incidental findings.  Considered but no indication for inpatient admission at this time, he safe for outpatient management.  Will  discharge with strict return precautions and instructions to follow-up with primary care doctor this week to get reassessed.  Strict return precautions given.  Discharge.  The patient is on the cardiac monitor to evaluate for evidence of arrhythmia and/or significant heart rate changes.   Clinical Course as of 10/31/23 1255  Sat Oct 31, 2023  1033 Patient swelling in his hand improved after elevation.  Now able to see wrinkles on his hand and appears symmetric with his right. [TT]  1056 CT Head Wo Contrast IMPRESSION: 1. No acute intracranial abnormality. 2. New lacunar infarct of the right major ganglia, likely remote. 3. Progression of periventricular and subcortical white matter hypoattenuation.  Will get an MRI. [TT]  1121 Independent review of labs, no leukocytosis, INR is mildly elevated but he is on anticoagulation, electrolytes not severely deranged, LFTs are normal, troponin is negative, UA is not consistent with UTI. [TT]  1209 MR BRAIN WO CONTRAST IMPRESSION: 1. No acute intracranial abnormality. 2. Mild for age chronic small vessel disease, including chronic lacunar infarct in the right lentiform identified on CT this morning.   [TT]  1228 On reassessment patient is asymptomatic at this time.  States that his hand swelling has improved.  Discussed with daughter and patient about imaging and lab results including incidental findings of the chronic lacunar infarct on the right.  Will copy and paste the MRI results into his discharge  paperwork so he can follow-up with his primary care doctor. [TT]  1254 Troponin I (High Sensitivity) Troponin x 2 is negative. [TT]    Clinical Course User Index [TT] Waymond Lorelle Cummins, MD     FINAL CLINICAL IMPRESSION(S) / ED DIAGNOSES   Final diagnoses:  Hand swelling  Episodic lightheadedness  Nausea  Cerebral infarction, chronic     Rx / DC Orders   ED Discharge Orders     None        Note:  This document was prepared using Dragon voice recognition software and may include unintentional dictation errors.    Waymond Lorelle Cummins, MD 10/31/23 [phone]

## 2023-10-31 NOTE — ED Notes (Signed)
 Pt in bed, pt denies pain, pt states that he is ready to go home, read and reviewed d/c instructions, pt verbalized understanding and follow up, pt ambulatory from department.

## 2023-10-31 NOTE — ED Notes (Signed)
 Pt in bed, pt denies pain, family at bedside, pt reports decreased swelling in hand

## 2023-10-31 NOTE — ED Triage Notes (Signed)
 Seen at [facility] for swelling in left hand swelling, lightheaded and nausea that started this AM  Takes eliquis  daily

## 2023-10-31 NOTE — Discharge Instructions (Addendum)
 Please make sure to keep your self hydrated.  If you notice your hand swelling recurs and the swelling is going up your arm, if you have pain, redness, purulent drainage, if you have any fevers, please return to the emergency department to be seen.  MRI Brain: MRI HEAD WITHOUT CONTRAST    TECHNIQUE:  Multiplanar, multiecho pulse sequences of the brain and surrounding  structures were obtained without intravenous contrast.    COMPARISON:  Head CT 1040 hours today.    FINDINGS:  Brain: Cerebral volume is within normal limits for age.    No restricted diffusion or evidence of acute infarction. Small  chronic right lentiform lacunar infarct (series 6, image 23).    No midline shift, mass effect, evidence of mass lesion,  ventriculomegaly, extra-axial collection or acute intracranial  hemorrhage. Cervicomedullary junction and pituitary are within  normal limits.    Widely scattered cerebral white matter T2 and FLAIR hyperintensity,  mild to moderate for age. No cortical encephalomalacia. Possible  chronic microhemorrhage left operculum (series 18, image 37) with no  other convincing chronic cerebral blood products. Left basal  ganglia, thalami, and brainstem appear normal. There is a tiny  chronic lacunar infarct in the left cerebellar hemisphere on series  8, image 7.    Vascular: Major intracranial vascular flow voids are preserved,  distal right vertebral artery appears to be dominant (normal  variant). Some generalized intracranial artery tortuosity.    Skull and upper cervical spine: Negative visible cervical spine.  Visualized bone marrow signal is within normal limits.    Sinuses/Orbits: Postoperative changes to both globes. Minor  paranasal sinus mucosal thickening.    Other: Visible internal auditory structures appear normal.  Stylomastoid foramina, visible scalp and face appear negative.    IMPRESSION:  1. No acute intracranial abnormality.  2. Mild for age chronic  small vessel disease, including chronic  lacunar infarct in the right lentiform identified on CT this  morning.

## 2023-10-31 NOTE — ED Notes (Signed)
 Pt in bed, pt c/o swelling to his L hand, elevated pt's hand, iv placed, blood drawn and sent, pt reports some decreased swelling in hand.  Pt denies pain, family at bedside.

## 2023-12-24 ENCOUNTER — Ambulatory Visit: Payer: Medicare Other

## 2023-12-25 ENCOUNTER — Ambulatory Visit: Payer: Medicare Other

## 2023-12-25 DIAGNOSIS — Z0000 Encounter for general adult medical examination without abnormal findings: Secondary | ICD-10-CM | POA: Diagnosis not present

## 2023-12-25 NOTE — Progress Notes (Signed)
 Subjective:   Erik Palmer is a 89 y.o. who presents for a Medicare Wellness preventive visit.  As a reminder, Annual Wellness Visits don't include a physical exam, and some assessments may be limited, especially if this visit is performed virtually. We may recommend an in-person follow-up visit with your provider if needed.  Visit Complete: Virtual I connected with  Karman Veney Cutrona on 12/25/23 by a audio enabled telemedicine application and verified that I am speaking with the correct person using two identifiers.  Patient Location: Home  Provider Location: Home Office  I discussed the limitations of evaluation and management by telemedicine. The patient expressed understanding and agreed to proceed.  Vital Signs: Because this visit was a virtual/telehealth visit, some criteria may be missing or patient reported. Any vitals not documented were not able to be obtained and vitals that have been documented are patient reported.  VideoDeclined- This patient declined Librarian, academic. Therefore the visit was completed with audio only.  Persons Participating in Visit: Patient.  AWV Questionnaire: No: Patient Medicare AWV questionnaire was not completed prior to this visit.  Cardiac Risk Factors include: advanced age (>50men, >41 women);dyslipidemia;hypertension;male gender;obesity (BMI >30kg/m2)     Objective:    There were no vitals filed for this visit. There is no height or weight on file to calculate BMI.     12/25/2023   12:54 PM 10/31/2023   10:07 AM 12/19/2022   10:19 AM 11/14/2022    9:33 AM 12/13/2021    3:00 PM 10/30/2021    7:28 AM 10/14/2021   12:42 PM  Advanced Directives  Does Patient Have a Medical Advance Directive? No No No No No No Yes  Type of Tax inspector;Living will  Does patient want to make changes to medical advance directive?      No - Patient declined No - Patient declined  Copy of  Healthcare Power of Attorney in Chart?       No - copy requested  Would patient like information on creating a medical advance directive? No - Patient declined No - Patient declined No - Patient declined No - Guardian declined  No - Patient declined     Current Medications (verified) Outpatient Encounter Medications as of 12/25/2023  Medication Sig   acetaminophen  (TYLENOL ) 650 MG CR tablet Take 500 mg by mouth 2 (two) times daily. Reported on 05/08/2015   amLODipine  (NORVASC ) 5 MG tablet Take 1 tablet (5 mg total) by mouth daily.   apixaban  (ELIQUIS ) 5 MG TABS tablet Take 1 tablet (5 mg total) by mouth 2 (two) times daily.   atorvastatin  (LIPITOR) 40 MG tablet Take 1 tablet (40 mg total) by mouth daily.   carvedilol (COREG) 6.25 MG tablet Take 6.25 mg by mouth 2 (two) times daily with a meal.   doxazosin  (CARDURA ) 2 MG tablet Take 1 tablet (2 mg total) by mouth 2 (two) times daily.   fluticasone  (FLONASE ) 50 MCG/ACT nasal spray SPRAY 2 SPRAYS INTO EACH NOSTRIL EVERY DAY   lisinopril  (ZESTRIL ) 10 MG tablet TAKE ONE-HALF TABLET BY MOUTH EVERY DAY FOR BLOOD PRESSURE   omeprazole  (PRILOSEC) 20 MG capsule Take 1 capsule (20 mg total) by mouth 2 (two) times daily.   QUEtiapine  (SEROQUEL ) 50 MG tablet Take 1 tablet (50 mg total) by mouth at bedtime.   No facility-administered encounter medications on file as of 12/25/2023.    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4;  Surgeon: Mittie Gaskin, MD;  Location: [facility];  Service: Ophthalmology;  Laterality: Right;   CATARACT EXTRACTION W/PHACO Left 10/30/2021   Procedure: CATARACT EXTRACTION PHACO AND INTRAOCULAR LENS PLACEMENT (IOC) LEFT M[place]ugin 9.90 01:40.8;  Surgeon: Mittie Gaskin, MD;  Location: [facility];  Service: Ophthalmology;  Laterality: Left;   CORONARY ARTERY BYPASS GRAFT  03/25/2017   at [facility]   TOTAL HIP ARTHROPLASTY Right 12/05/2015   Procedure: TOTAL HIP ARTHROPLASTY ANTERIOR APPROACH;  Surgeon: Ozell Flake, MD;  Location: [facility];  Service: Orthopedics;  Laterality: Right;   Family History  Problem Relation Age of Onset   Hypertension Mother    Heart block Mother    Stroke Mother    Depression Mother    Cancer Father    Heart attack Sister    COPD Sister    Hypertension Brother    Hypertension Brother    Hypertension Brother    Hypertension Brother    Heart attack Brother    Diabetes Neg Hx    Social History   Socioeconomic History   Marital status: Widowed    Spouse name: Not on file   Number of children: Not on file   Years of education: Not on file   Highest education level: Not on file  Occupational History   Occupation: retired  Tobacco Use   Smoking status: Former    Types: Cigarettes   Smokeless tobacco: Former    Types: Associate Professor status: Never Used  Substance and Sexual Activity   Alcohol use: Not Currently   Drug use: No   Sexual activity: Not Currently  Other Topics Concern   Not on file  Social History Narrative   Not on file   Social Drivers of Health   Financial Resource Strain: Low Risk  (12/25/2023)   Overall Financial Resource Strain (CARDIA)    Difficulty of Paying Living Expenses: Not  hard at all  Food Insecurity: No Food Insecurity (12/25/2023)   Hunger Vital Sign    Worried About Running Out of Food in the Last Year: Never true    Ran Out of Food in the Last Year: Never true  Transportation Needs: No Transportation Needs (12/25/2023)   PRAPARE - Administrator, Civil Service (Medical): No    Lack of Transportation (Non-Medical): No  Physical Activity: Sufficiently Active (12/25/2023)   Exercise Vital Sign    Days of Exercise per Week: 3 days    Minutes of Exercise per Session: 60 min  Stress: No Stress Concern Present (12/25/2023)   [employer] of Occupational Health - Occupational Stress Questionnaire    Feeling of Stress: Not at all  Social Connections: Moderately Isolated (12/25/2023)   Social Connection and Isolation Panel    Frequency of Communication  with Friends and Family: More than three times a week    Frequency of Social Gatherings with Friends and Family: Twice a week    Attends Religious Services: Never    Database administrator or Organizations: Yes    Attends Engineer, structural: More than 4 times per year    Marital Status: Widowed    Tobacco Counseling Counseling given: Not Answered    Clinical Intake:  Pre-visit preparation completed: Yes  Pain : No/denies pain     BMI - recorded: 34.1 Nutritional Status: BMI > 30  Obese Nutritional Risks: None Diabetes: No  Lab Results  Component Value Date   HGBA1C 6.0 (A) 09/10/2023   HGBA1C 5.8 (H) 02/18/2023   HGBA1C 5.9 (H) 08/15/2022     How often do you need to have someone help you when you read instructions, pamphlets, or other written materials from your doctor or pharmacy?: 1 - Never  Interpreter Needed?: No  Information entered by :: JHONNIE DAS, LPN   Activities of Daily Living     12/25/2023   12:56 PM  In your present state of health, do you have any difficulty performing the following activities:  Hearing? 0  Vision? 0  Difficulty  concentrating or making decisions? 1  Walking or climbing stairs? 1  Dressing or bathing? 0  Doing errands, shopping? 0  Preparing Food and eating ? N  Using the Toilet? N  In the past six months, have you accidently leaked urine? N  Do you have problems with loss of bowel control? N  Managing your Medications? N  Managing your Finances? N  Housekeeping or managing your Housekeeping? N    Patient Care Team: Edman Marsa PARAS, DO as PCP - General (Family Medicine) [facility], [facility] (Optometry)  I have updated your Care Teams any recent Medical Services you may have received from other providers in the past year.     Assessment:   This is a routine wellness examination for Shamarr.  Hearing/Vision screen Hearing Screening - Comments:: NO AIDS Vision Screening - Comments:: READERS- [facility]- SEEN ONCE PER YEAR   Goals Addressed             This Visit's Progress    DIET - REDUCE SUGAR INTAKE         Depression Screen     12/25/2023   12:53 PM 09/10/2023    8:15 AM 12/19/2022   10:18 AM 08/19/2022   10:35 AM 02/13/2022   10:37 AM 12/13/2021    3:00 PM 08/08/2021   10:46 AM  PHQ 2/9 Scores  PHQ - 2 Score 0  1 0 0 0 1  PHQ- 9 Score 0  1 0 0  5  Exception Documentation  Patient refusal         Fall Risk     12/25/2023   12:55 PM 12/19/2022   10:20 AM 08/19/2022   10:35 AM 02/13/2022   10:37 AM 12/13/2021    3:00 PM  Fall Risk   Falls in the past year? 0 0 0 0 0  Number falls in past yr: 0 0  0 0  Injury with Fall? 0 0  0 0  Risk for fall due to : No Fall Risks No Fall Risks  No Fall Risks Medication side effect  Follow up Falls evaluation completed;Falls prevention discussed Falls prevention discussed;Falls evaluation completed  Falls evaluation completed  Falls prevention discussed;Education provided;Falls evaluation completed      Data  saved with a previous flowsheet row definition    MEDICARE RISK AT HOME:  Medicare Risk at Home Any stairs  in or around the home?: Yes If so, are there any without handrails?: No Home free of loose throw rugs in walkways, pet beds, electrical cords, etc?: Yes Adequate lighting in your home to reduce risk of falls?: Yes Life alert?: No Use of a cane, walker or w/c?: No Grab bars in the bathroom?: Yes Shower chair or bench in shower?: Yes Elevated toilet seat or a handicapped toilet?: No  TIMED UP AND GO:  Was the test performed?  No  Cognitive Function: 6CIT completed    12/22/2014    8:59 AM  MMSE - Mini Mental State Exam  Orientation to time 5   Orientation to Place 5   Registration 3   Attention/ Calculation 5   Recall 3   Language- name 2 objects 2   Language- repeat 1  Language- follow 3 step command 3   Language- read & follow direction 1   Write a sentence 1   Copy design 1   Total score 30      Data saved with a previous flowsheet row definition        12/25/2023   12:58 PM 12/19/2022   10:21 AM 12/13/2021    3:02 PM 11/20/2020    8:24 AM 07/21/2017    8:43 AM  6CIT Screen  What Year? 0 points 0 points 0 points 0 points 0 points  What month? 0 points 0 points 0 points 0 points 0 points  What time? 0 points 0 points 0 points 0 points 0 points  Count back from 20 0 points 0 points 0 points 0 points 0 points  Months in reverse 2 points 0 points 0 points 0 points 0 points  Repeat phrase 0 points 0 points 6 points 4 points 0 points  Total Score 2 points 0 points 6 points 4 points 0 points    Immunizations Immunization History  Administered Date(s) Administered   Influenza-Unspecified 12/30/2002, 01/18/2004, 03/14/2005, 04/25/2005, 01/08/2006, 02/05/2007   Moderna Sars-Covid-2 Vaccination 04/09/2019   PFIZER(Purple Top)SARS-COV-2 Vaccination 04/29/2019, 05/31/2019, 02/07/2020   PNEUMOCOCCAL CONJUGATE-20 02/23/2023   Pfizer(Comirnaty)Fall Seasonal Vaccine 12 years and older 11/26/2022   Pneumococcal Conjugate-13 06/20/2014   Pneumococcal Polysaccharide-23  05/26/2002   Td 08/01/2005   Td (Adult),unspecified 08/01/2005   Tdap 04/06/2012, 06/20/2014    Screening Tests Health Maintenance  Topic Date Due   Zoster Vaccines- Shingrix (1 of 2) Never done   Influenza Vaccine  10/02/2023   COVID-19 Vaccine (6 - 2025-26 season) 11/02/2023   DTaP/Tdap/Td (4 - Td or Tdap) 06/19/2024   Medicare Annual Wellness (AWV)  12/24/2024   Pneumococcal Vaccine: 50+ Years  Completed   Meningococcal B Vaccine  Aged Out    Health Maintenance Items Addressed: NEEDS SHINGRIX; AGED OUT OF COLONOSCOPY  Additional Screening:  Vision Screening: Recommended annual ophthalmology exams for early detection of glaucoma and other disorders of the eye. Is the patient up to date with their annual eye exam?  Yes  Who is the provider or what is the name of the office in which the patient attends annual eye exams? [facility]  Dental Screening: Recommended annual dental exams for proper oral hygiene  Community Resource Referral / Chronic Care Management: CRR required this visit?  No   CCM required this visit?  No   Plan:    I have personally reviewed and noted the following in the patient's  chart:   Medical and social history Use of alcohol, tobacco or illicit drugs  Current medications and supplements including opioid prescriptions. Patient is not currently taking opioid prescriptions. Functional ability and status Nutritional status Physical activity Advanced directives List of other physicians Hospitalizations, surgeries, and ER visits in previous 12 months Vitals Screenings to include cognitive, depression, and falls Referrals and appointments  In addition, I have reviewed and discussed with patient certain preventive protocols, quality metrics, and best practice recommendations. A written personalized care plan for preventive services as well as general preventive health recommendations were provided to patient.   Jhonnie GORMAN Das, LPN   [id number]    After Visit Summary: (MyChart) Due to this being a telephonic visit, the after visit summary with patients personalized plan was offered to patient via MyChart   Notes: Nothing significant to report at this time.

## 2023-12-25 NOTE — Patient Instructions (Addendum)
 Erik Palmer,  Thank you for taking the time for your Medicare Wellness Visit. I appreciate your continued commitment to your health goals. Please review the care plan we discussed, and feel free to reach out if I can assist you further.  Medicare recommends these wellness visits once per year to help you and your care team stay ahead of potential health issues. These visits are designed to focus on prevention, allowing your provider to concentrate on managing your acute and chronic conditions during your regular appointments.  Please note that Annual Wellness Visits do not include a physical exam. Some assessments may be limited, especially if the visit was conducted virtually. If needed, we may recommend a separate in-person follow-up with your provider.  Ongoing Care Seeing your primary care provider every 3 to 6 months helps us  monitor your health and provide consistent, personalized care.   Referrals If a referral was made during today's visit and you haven't received any updates within two weeks, please contact the referred provider directly to check on the status.  Recommended Screenings:  Health Maintenance  Topic Date Due   Zoster (Shingles) Vaccine (1 of 2) Never done   Flu Shot  10/02/2023   COVID-19 Vaccine (6 - 2025-26 season) 11/02/2023   DTaP/Tdap/Td vaccine (4 - Td or Tdap) 06/19/2024   Medicare Annual Wellness Visit  12/24/2024   Pneumococcal Vaccine for age over 8  Completed   Meningitis B Vaccine  Aged Out     Advance Care Planning is important because it: Ensures you receive medical care that aligns with your values, goals, and preferences. Provides guidance to your family and loved ones, reducing the emotional burden of decision-making during critical moments.  Vision: Annual vision screenings are recommended for early detection of glaucoma, cataracts, and diabetic retinopathy. These exams can also reveal signs of chronic conditions such as diabetes and high blood  pressure.  Dental: Annual dental screenings help detect early signs of oral cancer, gum disease, and other conditions linked to overall health, including heart disease and diabetes.  Please see the attached documents for additional preventive care recommendations.   NEXT AWV 12/30/24 @ 11:30 AM IN PERSON

## 2024-02-29 ENCOUNTER — Other Ambulatory Visit

## 2024-02-29 DIAGNOSIS — E782 Mixed hyperlipidemia: Secondary | ICD-10-CM

## 2024-02-29 DIAGNOSIS — I10 Essential (primary) hypertension: Secondary | ICD-10-CM

## 2024-02-29 DIAGNOSIS — N138 Other obstructive and reflux uropathy: Secondary | ICD-10-CM

## 2024-02-29 DIAGNOSIS — R7309 Other abnormal glucose: Secondary | ICD-10-CM

## 2024-02-29 DIAGNOSIS — Z0000 Encounter for general adult medical examination without abnormal findings: Secondary | ICD-10-CM

## 2024-02-29 DIAGNOSIS — I4821 Permanent atrial fibrillation: Secondary | ICD-10-CM

## 2024-03-01 LAB — LIPID PANEL
Cholesterol: 169 mg/dL
HDL: 45 mg/dL
LDL Cholesterol (Calc): 98 mg/dL
Non-HDL Cholesterol (Calc): 124 mg/dL
Total CHOL/HDL Ratio: 3.8 (calc)
Triglycerides: 165 mg/dL — ABNORMAL HIGH

## 2024-03-01 LAB — COMPREHENSIVE METABOLIC PANEL WITH GFR
AG Ratio: 2 (calc) (ref 1.0–2.5)
ALT: 13 U/L (ref 9–46)
AST: 14 U/L (ref 10–35)
Albumin: 4.3 g/dL (ref 3.6–5.1)
Alkaline phosphatase (APISO): 67 U/L (ref 35–144)
BUN/Creatinine Ratio: 19 (calc) (ref 6–22)
BUN: 13 mg/dL (ref 7–25)
CO2: 27 mmol/L (ref 20–32)
Calcium: 9.3 mg/dL (ref 8.6–10.3)
Chloride: 104 mmol/L (ref 98–110)
Creat: 0.69 mg/dL — ABNORMAL LOW (ref 0.70–1.22)
Globulin: 2.1 g/dL (ref 1.9–3.7)
Glucose, Bld: 89 mg/dL (ref 65–99)
Potassium: 4.1 mmol/L (ref 3.5–5.3)
Sodium: 140 mmol/L (ref 135–146)
Total Bilirubin: 0.6 mg/dL (ref 0.2–1.2)
Total Protein: 6.4 g/dL (ref 6.1–8.1)
eGFR: 90 mL/min/1.73m2

## 2024-03-01 LAB — CBC WITH DIFFERENTIAL/PLATELET
Absolute Lymphocytes: 1753 {cells}/uL (ref 850–3900)
Absolute Monocytes: 594 {cells}/uL (ref 200–950)
Basophils Absolute: 28 {cells}/uL (ref 0–200)
Basophils Relative: 0.5 %
Eosinophils Absolute: 162 {cells}/uL (ref 15–500)
Eosinophils Relative: 2.9 %
HCT: 36 % — ABNORMAL LOW (ref 39.4–51.1)
Hemoglobin: 11.7 g/dL — ABNORMAL LOW (ref 13.2–17.1)
MCH: 30.7 pg (ref 27.0–33.0)
MCHC: 32.5 g/dL (ref 31.6–35.4)
MCV: 94.5 fL (ref 81.4–101.7)
MPV: 10.5 fL (ref 7.5–12.5)
Monocytes Relative: 10.6 %
Neutro Abs: 3063 {cells}/uL (ref 1500–7800)
Neutrophils Relative %: 54.7 %
Platelets: 262 Thousand/uL (ref 140–400)
RBC: 3.81 Million/uL — ABNORMAL LOW (ref 4.20–5.80)
RDW: 12.5 % (ref 11.0–15.0)
Total Lymphocyte: 31.3 %
WBC: 5.6 Thousand/uL (ref 3.8–10.8)

## 2024-03-01 LAB — URINALYSIS, ROUTINE W REFLEX MICROSCOPIC
Bilirubin Urine: NEGATIVE
Glucose, UA: NEGATIVE
Hgb urine dipstick: NEGATIVE
Ketones, ur: NEGATIVE
Leukocytes,Ua: NEGATIVE
Nitrite: NEGATIVE
Protein, ur: NEGATIVE
Specific Gravity, Urine: 1.014 (ref 1.001–1.035)
pH: 7 (ref 5.0–8.0)

## 2024-03-01 LAB — PSA: PSA: 2.25 ng/mL

## 2024-03-01 LAB — HEMOGLOBIN A1C
Hgb A1c MFr Bld: 5.8 % — ABNORMAL HIGH
Mean Plasma Glucose: 120 mg/dL
eAG (mmol/L): 6.6 mmol/L

## 2024-03-01 LAB — TSH: TSH: 5.6 m[IU]/L — ABNORMAL HIGH (ref 0.40–4.50)

## 2024-03-06 ENCOUNTER — Other Ambulatory Visit: Payer: Self-pay | Admitting: Family Medicine

## 2024-03-06 DIAGNOSIS — E782 Mixed hyperlipidemia: Secondary | ICD-10-CM

## 2024-03-06 DIAGNOSIS — K219 Gastro-esophageal reflux disease without esophagitis: Secondary | ICD-10-CM

## 2024-03-06 DIAGNOSIS — N401 Enlarged prostate with lower urinary tract symptoms: Secondary | ICD-10-CM

## 2024-03-06 DIAGNOSIS — F5104 Psychophysiologic insomnia: Secondary | ICD-10-CM

## 2024-03-06 DIAGNOSIS — I10 Essential (primary) hypertension: Secondary | ICD-10-CM

## 2024-03-07 ENCOUNTER — Ambulatory Visit (INDEPENDENT_AMBULATORY_CARE_PROVIDER_SITE_OTHER): Admitting: Family Medicine

## 2024-03-07 ENCOUNTER — Other Ambulatory Visit: Payer: Self-pay | Admitting: Family Medicine

## 2024-03-07 ENCOUNTER — Encounter: Payer: Self-pay | Admitting: Family Medicine

## 2024-03-07 VITALS — BP 130/70 | HR 63 | Ht 69.0 in | Wt 241.0 lb

## 2024-03-07 DIAGNOSIS — R7989 Other specified abnormal findings of blood chemistry: Secondary | ICD-10-CM

## 2024-03-07 DIAGNOSIS — I4821 Permanent atrial fibrillation: Secondary | ICD-10-CM | POA: Diagnosis not present

## 2024-03-07 DIAGNOSIS — R7309 Other abnormal glucose: Secondary | ICD-10-CM

## 2024-03-07 DIAGNOSIS — I10 Essential (primary) hypertension: Secondary | ICD-10-CM | POA: Diagnosis not present

## 2024-03-07 DIAGNOSIS — I2510 Atherosclerotic heart disease of native coronary artery without angina pectoris: Secondary | ICD-10-CM | POA: Diagnosis not present

## 2024-03-07 DIAGNOSIS — F3342 Major depressive disorder, recurrent, in full remission: Secondary | ICD-10-CM | POA: Diagnosis not present

## 2024-03-07 DIAGNOSIS — N401 Enlarged prostate with lower urinary tract symptoms: Secondary | ICD-10-CM | POA: Diagnosis not present

## 2024-03-07 DIAGNOSIS — F5104 Psychophysiologic insomnia: Secondary | ICD-10-CM

## 2024-03-07 DIAGNOSIS — Z0000 Encounter for general adult medical examination without abnormal findings: Secondary | ICD-10-CM | POA: Diagnosis not present

## 2024-03-07 DIAGNOSIS — E782 Mixed hyperlipidemia: Secondary | ICD-10-CM

## 2024-03-07 DIAGNOSIS — N138 Other obstructive and reflux uropathy: Secondary | ICD-10-CM

## 2024-03-07 MED ORDER — ATORVASTATIN CALCIUM 40 MG PO TABS: 40.0000 mg | ORAL_TABLET | Freq: Every day | ORAL | 3 refills | Status: AC

## 2024-03-07 MED ORDER — AMLODIPINE BESYLATE 5 MG PO TABS: 5.0000 mg | ORAL_TABLET | Freq: Every day | ORAL | 3 refills | Status: AC

## 2024-03-07 MED ORDER — OMEPRAZOLE 20 MG PO CPDR: 20.0000 mg | DELAYED_RELEASE_CAPSULE | Freq: Two times a day (BID) | ORAL | 3 refills | Status: AC

## 2024-03-07 MED ORDER — DOXAZOSIN MESYLATE 2 MG PO TABS: 2.0000 mg | ORAL_TABLET | Freq: Two times a day (BID) | ORAL | 3 refills | Status: AC

## 2024-03-07 NOTE — Progress Notes (Signed)
 "  Subjective:    Patient ID: Erik Palmer, male    DOB: [birth year], 89 y.o.   MRN: [id number]  Erik Palmer is a 89 y.o. male presenting on 03/07/2024 for Annual Exam   HPI  Discussed the use of AI scribe software for clinical note transcription with the patient, who gave verbal consent to proceed.  History of Present Illness   Erik Palmer is an 89 year old male who presents for a routine follow-up.  Gastroesophageal reflux symptoms - History of heartburn - Symptoms well-controlled with omeprazole  twice daily - Significant improvement in symptoms since starting omeprazole   Peripheral edema - Experiences fluid retention in feet and ankles - Edema is improved in the morning - Does not regularly elevate legs  Analgesic use and adverse effects - Currently taking two Aleve  daily for pain management - Previously discontinued Aleve  due to eye irritation, which resolved after stopping the medication - Previously stopped Tylenol    Peripheral edema - Experiences intermittent fluid retention in feet and ankles - Edema varies throughout the day, worsening by evening - Minimal swelling in the morning, described as 'like crow feet'   Normocytic Anemia Hgb 11.7 stable from previous    CHRONIC HTN: Reports elevated BP here, and home readings avg 130/70s, usually improve on repeat reading Current Meds - Amlodipine  5mg  daily, Carvedilol 6.25mg  BID, Doxazosin  2mg  BID, Lisinopril  5mg  daily  (half of 10mg ) Reports good compliance, took meds today. Tolerating well, w/o complaints. Denies CP, dyspnea, HA, edema, dizziness / lightheadedness   Elevated A1c A1c 5.8 now. Similar to prior. A1c average 5.6 to 6.0  CBGs: not checking CBG regularly Meds: never on med Currently not on ACEi / ARB Lifestyle: - Diet (improved diet)  - Exercise (limited but he is improving exercise) Denies hypoglycemia   HYPERLIPIDEMIA: - Reports no concerns. Last lipid panel 02/2024 controlled LDL 98, mild  elevation from last lab - Currently taking Atorvastatin  40mg , tolerating well without side effects or myalgias    Permanent Atrial Fibrillation / HYPERTENSION Coronary Artery Disease Followed by [facility] On Eliquis  and medication management for CAD with beta blocker, ACEi, Statin therapy.    BPH with LUTS Followed by [facility] He saw Urologist they discussed OAB symptoms Off Vesicare, Oxybutynin  and other On Doxazosin  - Frequent urges to urinate with only small amounts passed - Trialed multiple medications; one prior medication caused urinary retention and was discontinued - Currently taking doxazosin , which provides partial relief for urinary symptoms and also assists with blood pressure control   Major Depression recurrent remission / Insomnia Taking Seroquel  50mg  nightly currently and doing well Wife died in 11/12/23he has had some sad mood but overall not worsening depression. He is managing with family support system   Health Maintenance:  Elevated PSA / Prostate CA Screening: PSA trend stable at 2.25, prior 2.3 to 2.6   He declines Flu Shot and Shingles vaccine at this time.     03/07/2024   11:36 PM 12/25/2023   12:53 PM 12/19/2022   10:18 AM  Depression screen PHQ 2/9  Decreased Interest 0 0 0  Down, Depressed, Hopeless 0 0 1  PHQ - 2 Score 0 0 1  Altered sleeping 0 0 0  Tired, decreased energy 0 0 0  Change in appetite 0 0 0  Feeling bad or failure about yourself  0 0 0  Trouble concentrating 0 0 0  Moving slowly or fidgety/restless 0 0 0  Suicidal thoughts 0 0  0  PHQ-9 Score 0 0  1   Difficult doing work/chores Not difficult at all Not difficult at all Not difficult at all     Data saved with a previous flowsheet row definition       08/19/2022   10:35 AM 02/13/2022   10:37 AM 08/03/2020    8:41 AM 07/21/2017    9:02 AM  GAD 7 : Generalized Anxiety Score  Nervous, Anxious, on Edge 0 0 0 0  Control/stop worrying 0 0 0 0  Worry too much -  different things 0 0 0 0  Trouble relaxing 0 0 0 0  Restless 0 0 0 0  Easily annoyed or irritable 0 0 0 0  Afraid - awful might happen 0 0 0 0  Total GAD 7 Score 0 0 0 0  Anxiety Difficulty  Not difficult at all Not difficult at all Not difficult at all     Past Medical History:  Diagnosis Date   Angina pectoris syndrome    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   Arthritis    GERD (gastroesophageal reflux disease)    Grief at loss of child 09/03/2014   Hypercholesteremia    Hypertension    Prostate enlargement    Past Surgical History:  Procedure Laterality Date   BROW LIFT Bilateral 11/14/2022   Procedure: BLEPHAROPLASTY UPPER EYELID; W/EXCESS SKIN BROW PTOSIS REPAIR BILATERAL;  Surgeon: Ashley Greig HERO, MD;  Location: [facility];  Service: Ophthalmology;  Laterality: Bilateral;   CATARACT EXTRACTION W/PHACO Right 10/14/2021   Procedure: CATARACT EXTRACTION PHACO AND INTRAOCULAR LENS PLACEMENT (IOC) RIGHT 11.27 01:28.4;  Surgeon: Mittie Gaskin, MD;  Location: [facility];  Service: Ophthalmology;  Laterality: Right;   CATARACT EXTRACTION W/PHACO Left 10/30/2021   Procedure: CATARACT EXTRACTION PHACO AND INTRAOCULAR LENS PLACEMENT (IOC) LEFT M[place]ugin 9.90 01:40.8;  Surgeon: Mittie Gaskin, MD;  Location: [facility];  Service: Ophthalmology;  Laterality: Left;   CORONARY ARTERY BYPASS GRAFT  03/25/2017   at [facility]   TOTAL HIP ARTHROPLASTY Right 12/05/2015   Procedure: TOTAL HIP ARTHROPLASTY ANTERIOR APPROACH;  Surgeon: Ozell Flake, MD;  Location: [facility];  Service: Orthopedics;  Laterality: Right;   Social History   Socioeconomic History   Marital status: Widowed    Spouse name: Not on file   Number of children: Not on file   Years of education: Not on file   Highest education level: Not on file  Occupational History   Occupation: retired  Tobacco Use   Smoking status: Former    Types: Cigarettes   Smokeless tobacco:  Former    Types: Engineer, Drilling   Vaping status: Never Used  Substance and Sexual Activity   Alcohol use: Not Currently   Drug use: No   Sexual activity: Not Currently  Other Topics Concern   Not on file  Social History Narrative   Not on file   Social Drivers of Health   Tobacco Use: Medium Risk (03/07/2024)   Patient History    Smoking Tobacco Use: Former    Smokeless Tobacco Use: Former    Passive Exposure: Not on Actuary Strain: Low Risk (12/25/2023)   Overall Financial Resource Strain (CARDIA)    Difficulty of Paying Living Expenses: Not hard at all  Food Insecurity: No Food Insecurity (12/25/2023)   Epic    Worried About Radiation Protection Practitioner of Food in the Last Year: Never true    Ran Out of Food in the  Last Year: Never true  Transportation Needs: No Transportation Needs (12/25/2023)   Epic    Lack of Transportation (Medical): No    Lack of Transportation (Non-Medical): No  Physical Activity: Sufficiently Active (12/25/2023)   Exercise Vital Sign    Days of Exercise per Week: 3 days    Minutes of Exercise per Session: 60 min  Stress: No Stress Concern Present (12/25/2023)   [employer] of Occupational Health - Occupational Stress Questionnaire    Feeling of Stress: Not at all  Social Connections: Moderately Isolated (12/25/2023)   Social Connection and Isolation Panel    Frequency of Communication with Friends and Family: More than three times a week    Frequency of Social Gatherings with Friends and Family: Twice a week    Attends Religious Services: Never    Database Administrator or Organizations: Yes    Attends Engineer, Structural: More than 4 times per year    Marital Status: Widowed  Intimate Partner Violence: Not At Risk (12/25/2023)   Epic    Fear of Current or Ex-Partner: No    Emotionally Abused: No    Physically Abused: No    Sexually Abused: No  Depression (PHQ2-9): Low Risk (03/07/2024)   Depression (PHQ2-9)    PHQ-2  Score: 0  Alcohol Screen: Low Risk (12/25/2023)   Alcohol Screen    Last Alcohol Screening Score (AUDIT): 0  Housing: Unknown (12/25/2023)   Epic    Unable to Pay for Housing in the Last Year: No    Number of Times Moved in the Last Year: Not on file    Homeless in the Last Year: No  Utilities: Not At Risk (12/25/2023)   Epic    Threatened with loss of utilities: No  Health Literacy: Adequate Health Literacy (12/25/2023)   B1300 Health Literacy    Frequency of need for help with medical instructions: Never   Family History  Problem Relation Age of Onset   Hypertension Mother    Heart block Mother    Stroke Mother    Depression Mother    Cancer Father    Heart attack Sister    COPD Sister    Hypertension Brother    Hypertension Brother    Hypertension Brother    Hypertension Brother    Heart attack Brother    Diabetes Neg Hx    Current Outpatient Medications on File Prior to Visit  Medication Sig   acetaminophen  (TYLENOL ) 650 MG CR tablet Take 500 mg by mouth 2 (two) times daily. Reported on 05/08/2015   apixaban  (ELIQUIS ) 5 MG TABS tablet Take 1 tablet (5 mg total) by mouth 2 (two) times daily.   carvedilol (COREG) 6.25 MG tablet Take 6.25 mg by mouth 2 (two) times daily with a meal.   fluticasone  (FLONASE ) 50 MCG/ACT nasal spray SPRAY 2 SPRAYS INTO EACH NOSTRIL EVERY DAY   lisinopril  (ZESTRIL ) 10 MG tablet TAKE ONE-HALF TABLET BY MOUTH EVERY DAY FOR BLOOD PRESSURE   amLODipine  (NORVASC ) 5 MG tablet Take 1 tablet (5 mg total) by mouth daily.   atorvastatin  (LIPITOR) 40 MG tablet Take 1 tablet (40 mg total) by mouth daily.   doxazosin  (CARDURA ) 2 MG tablet Take 1 tablet (2 mg total) by mouth 2 (two) times daily.   omeprazole  (PRILOSEC) 20 MG capsule Take 1 capsule (20 mg total) by mouth 2 (two) times daily.   QUEtiapine  (SEROQUEL ) 50 MG tablet TAKE 1 TABLET BY MOUTH EVERYDAY AT BEDTIME   No current  facility-administered medications on file prior to visit.    Review of  Systems  Constitutional:  Negative for activity change, appetite change, chills, diaphoresis, fatigue and fever.  HENT:  Negative for congestion and hearing loss.   Eyes:  Negative for visual disturbance.  Respiratory:  Negative for cough, chest tightness, shortness of breath and wheezing.   Cardiovascular:  Negative for chest pain, palpitations and leg swelling.  Gastrointestinal:  Negative for abdominal pain, constipation, diarrhea, nausea and vomiting.  Genitourinary:  Negative for dysuria, frequency and hematuria.  Musculoskeletal:  Negative for arthralgias and neck pain.  Skin:  Negative for rash.  Neurological:  Negative for dizziness, weakness, light-headedness, numbness and headaches.  Hematological:  Negative for adenopathy.  Psychiatric/Behavioral:  Negative for behavioral problems, dysphoric mood and sleep disturbance.    Per HPI unless specifically indicated above     Objective:    BP 130/70 (BP Location: Left Arm, Patient Position: Sitting, Cuff Size: Normal)   Pulse 63   Ht 5' 9 (1.753 m)   Wt 241 lb (109.3 kg)   SpO2 98%   BMI 35.59 kg/m   Wt Readings from Last 3 Encounters:  03/07/24 241 lb (109.3 kg)  10/31/23 231 lb (104.8 kg)  09/10/23 231 lb 9.6 oz (105.1 kg)    Physical Exam Vitals and nursing note reviewed.  Constitutional:      General: He is not in acute distress.    Appearance: He is well-developed. He is not diaphoretic.     Comments: Well-appearing, comfortable, cooperative  HENT:     Head: Normocephalic and atraumatic.  Eyes:     General:        Right eye: No discharge.        Left eye: No discharge.     Conjunctiva/sclera: Conjunctivae normal.     Pupils: Pupils are equal, round, and reactive to light.  Neck:     Thyroid : No thyromegaly.  Cardiovascular:     Rate and Rhythm: Normal rate and regular rhythm.     Pulses: Normal pulses.     Heart sounds: Normal heart sounds. No murmur heard. Pulmonary:     Effort: Pulmonary effort is  normal. No respiratory distress.     Breath sounds: Normal breath sounds. No wheezing or rales.  Abdominal:     General: Bowel sounds are normal. There is no distension.     Palpations: Abdomen is soft. There is no mass.     Tenderness: There is no abdominal tenderness.  Musculoskeletal:        General: No tenderness. Normal range of motion.     Cervical back: Normal range of motion and neck supple.     Right lower leg: Edema (+1 pitting edema bilateral symmetrical) present.     Left lower leg: Edema present.     Comments: Upper / Lower Extremities: - Normal muscle tone, strength bilateral upper extremities 5/5, lower extremities 5/5  Lymphadenopathy:     Cervical: No cervical adenopathy.  Skin:    General: Skin is warm and dry.     Findings: No erythema or rash.  Neurological:     Mental Status: He is alert and oriented to person, place, and time.     Comments: Distal sensation intact to light touch all extremities  Psychiatric:        Mood and Affect: Mood normal.        Behavior: Behavior normal.        Thought Content: Thought content normal.  Comments: Well groomed, good eye contact, normal speech and thoughts     Results for orders placed or performed in visit on 02/29/24  Urinalysis, Routine w reflex microscopic   Collection Time: 02/29/24  8:04 AM  Result Value Ref Range   Color, Urine YELLOW YELLOW   APPearance CLEAR CLEAR   Specific Gravity, Urine 1.014 1.001 - 1.035   pH 7.0 5.0 - 8.0   Glucose, UA NEGATIVE NEGATIVE   Bilirubin Urine NEGATIVE NEGATIVE   Ketones, ur NEGATIVE NEGATIVE   Hgb urine dipstick NEGATIVE NEGATIVE   Protein, ur NEGATIVE NEGATIVE   Nitrite NEGATIVE NEGATIVE   Leukocytes,Ua NEGATIVE NEGATIVE  Comprehensive metabolic panel with GFR   Collection Time: 02/29/24  8:04 AM  Result Value Ref Range   Glucose, Bld 89 65 - 99 mg/dL   BUN 13 7 - 25 mg/dL   Creat 9.30 (L) 9.29 - 1.22 mg/dL   eGFR 90 > OR = 60 fO/fpw/8.26f7   BUN/Creatinine  Ratio 19 6 - 22 (calc)   Sodium 140 135 - 146 mmol/L   Potassium 4.1 3.5 - 5.3 mmol/L   Chloride 104 98 - 110 mmol/L   CO2 27 20 - 32 mmol/L   Calcium  9.3 8.6 - 10.3 mg/dL   Total Protein 6.4 6.1 - 8.1 g/dL   Albumin 4.3 3.6 - 5.1 g/dL   Globulin 2.1 1.9 - 3.7 g/dL (calc)   AG Ratio 2.0 1.0 - 2.5 (calc)   Total Bilirubin 0.6 0.2 - 1.2 mg/dL   Alkaline phosphatase (APISO) 67 35 - 144 U/L   AST 14 10 - 35 U/L   ALT 13 9 - 46 U/L  TSH   Collection Time: 02/29/24  8:04 AM  Result Value Ref Range   TSH 5.60 (H) 0.40 - 4.50 mIU/L  PSA   Collection Time: 02/29/24  8:04 AM  Result Value Ref Range   PSA 2.25 < OR = 4.00 ng/mL  CBC with Differential/Platelet   Collection Time: 02/29/24  8:04 AM  Result Value Ref Range   WBC 5.6 3.8 - 10.8 Thousand/uL   RBC 3.81 (L) 4.20 - 5.80 Million/uL   Hemoglobin 11.7 (L) 13.2 - 17.1 g/dL   HCT 63.9 (L) 60.5 - 48.8 %   MCV 94.5 81.4 - 101.7 fL   MCH 30.7 27.0 - 33.0 pg   MCHC 32.5 31.6 - 35.4 g/dL   RDW 87.4 88.9 - 84.9 %   Platelets 262 140 - 400 Thousand/uL   MPV 10.5 7.5 - 12.5 fL   Neutro Abs 3,063 1,500 - 7,800 cells/uL   Absolute Lymphocytes 1,753 850 - 3,900 cells/uL   Absolute Monocytes 594 200 - 950 cells/uL   Eosinophils Absolute 162 15 - 500 cells/uL   Basophils Absolute 28 0 - 200 cells/uL   Neutrophils Relative % 54.7 %   Total Lymphocyte 31.3 %   Monocytes Relative 10.6 %   Eosinophils Relative 2.9 %   Basophils Relative 0.5 %  Hemoglobin A1c   Collection Time: 02/29/24  8:04 AM  Result Value Ref Range   Hgb A1c MFr Bld 5.8 (H) <5.7 %   Mean Plasma Glucose 120 mg/dL   eAG (mmol/L) 6.6 mmol/L  Lipid panel   Collection Time: 02/29/24  8:04 AM  Result Value Ref Range   Cholesterol 169 <200 mg/dL   HDL 45 > OR = 40 mg/dL   Triglycerides 834 (H) <150 mg/dL   LDL Cholesterol (Calc) 98 mg/dL (calc)   Total CHOL/HDL Ratio 3.8 <  5.0 (calc)   Non-HDL Cholesterol (Calc) 124 <130 mg/dL (calc)      Assessment & Plan:    Problem List Items Addressed This Visit     Atrial fibrillation (HCC)   BPH with obstruction/lower urinary tract symptoms   CAD (coronary artery disease)   Elevated hemoglobin A1c   Essential hypertension   Hyperlipidemia   Insomnia   Major depression, recurrent, full remission   Morbid obesity (HCC)   Other Visit Diagnoses       Annual physical exam    -  Primary        Updated Health Maintenance information Reviewed recent lab results with patient Encouraged improvement to lifestyle with diet and exercise Goal of weight loss  Benign prostatic hyperplasia with lower urinary tract symptoms Chronic urinary symptoms with frequent urination and incomplete bladder emptying. Doxazosin  provides some relief. - Continue doxazosin  for prostate and blood pressure management.  Essential hypertension Blood pressure is well-controlled at 130/70 mmHg with current medication regimen. - Continue current antihypertensive medications including amlodipine  and doxazosin .  Atherosclerotic heart disease of native coronary artery without angina pectoris Atrial Fibrillation No current angina symptoms. On a stable medication regimen. Cardiologist is monitoring condition. - Continue current heart medications as prescribed by cardiologist.  Mixed hyperlipidemia Cholesterol levels are well-controlled with atorvastatin . LDL is below 100 mg/dL. - Continue atorvastatin  40 mg daily.  Prediabetes Hemoglobin A1c is 5.8%, indicating mild prediabetes. - Continue monitoring blood glucose levels.  Major Depression Recurrent in remission Psychophysiologic insomnia Sleep is managed with Seroquel . - Continue Seroquel  for sleep management.  Gastroesophageal reflux disease GERD symptoms are well-controlled with omeprazole . - Continue omeprazole  twice daily.  Anemia Mild anemia consistent with baseline. - Continue monitoring hemoglobin levels.  Elevated TSH TSH level is slightly elevated, pending  T4 - Recheck thyroid  function tests in 6 months.  General Health Maintenance Discussed vaccination status. Pneumonia vaccine is up to date. Declined flu and shingles vaccines. - Continue current vaccination status.         No orders of the defined types were placed in this encounter.   No orders of the defined types were placed in this encounter.    Follow up plan: Return for 6 month fasting lab > 1 week later Follow-up PreDM, BPH, Thyroid  lab results.  Future labs 09/09/24 A1c TSH Free T4  Marsa Officer, DO [facility] 03/07/2024, 11:15 AM  "

## 2024-03-07 NOTE — Patient Instructions (Addendum)
 Thank you for coming to the office today.  Keep up with medications. No changes today, refills sent.  Monitor urination and issues with prostate, we can follow up sooner or talk to [facility]  BP stable.  Recent Labs    09/10/23 0817 02/29/24 0804  HGBA1C 6.0* 5.8*    Please schedule a Follow-up Appointment to: Return for 6 month fasting lab > 1 week later Follow-up PreDM, BPH, Thyroid  lab results.  If you have any other questions or concerns, please feel free to call the office or send a message through MyChart. You may also schedule an earlier appointment if necessary.  Additionally, you may be receiving a survey about your experience at our office within a few days to 1 week by e-mail or mail. We value your feedback.  Marsa Officer, DO [facility], [place]

## 2024-03-28 ENCOUNTER — Other Ambulatory Visit: Payer: Self-pay

## 2024-03-28 ENCOUNTER — Emergency Department
Admission: EM | Admit: 2024-03-28 | Discharge: 2024-03-28 | Disposition: A | Discharge status: Home / Self Care | Attending: Emergency Medicine | Admitting: Emergency Medicine

## 2024-03-28 ENCOUNTER — Emergency Department

## 2024-03-28 DIAGNOSIS — I517 Cardiomegaly: Secondary | ICD-10-CM | POA: Diagnosis not present

## 2024-03-28 DIAGNOSIS — R079 Chest pain, unspecified: Secondary | ICD-10-CM | POA: Diagnosis present

## 2024-03-28 DIAGNOSIS — R0602 Shortness of breath: Secondary | ICD-10-CM | POA: Insufficient documentation

## 2024-03-28 DIAGNOSIS — E8779 Other fluid overload: Secondary | ICD-10-CM | POA: Insufficient documentation

## 2024-03-28 DIAGNOSIS — J111 Influenza due to unidentified influenza virus with other respiratory manifestations: Secondary | ICD-10-CM | POA: Diagnosis not present

## 2024-03-28 LAB — BASIC METABOLIC PANEL WITH GFR
Anion gap: 11 (ref 5–15)
BUN: 13 mg/dL (ref 8–23)
CO2: 28 mmol/L (ref 22–32)
Calcium: 9.3 mg/dL (ref 8.9–10.3)
Chloride: 98 mmol/L (ref 98–111)
Creatinine, Ser: 0.75 mg/dL (ref 0.61–1.24)
GFR, Estimated: >60 mL/min
Glucose, Bld: 111 mg/dL — ABNORMAL HIGH (ref 70–99)
Potassium: 3.4 mmol/L — ABNORMAL LOW (ref 3.5–5.1)
Sodium: 138 mmol/L (ref 135–145)

## 2024-03-28 LAB — CBC WITH DIFFERENTIAL/PLATELET
Abs Immature Granulocytes: 0.02 10*3/uL (ref 0.00–0.07)
Basophils Absolute: 0 10*3/uL (ref 0.0–0.1)
Basophils Relative: 0 %
Eosinophils Absolute: 0.2 10*3/uL (ref 0.0–0.5)
Eosinophils Relative: 4 %
HCT: 34.4 % — ABNORMAL LOW (ref 39.0–52.0)
Hemoglobin: 11.7 g/dL — ABNORMAL LOW (ref 13.0–17.0)
Immature Granulocytes: 0 %
Lymphocytes Relative: 15 %
Lymphs Abs: 0.9 10*3/uL (ref 0.7–4.0)
MCH: 30.6 pg (ref 26.0–34.0)
MCHC: 34 g/dL (ref 30.0–36.0)
MCV: 90.1 fL (ref 80.0–100.0)
Monocytes Absolute: 0.9 10*3/uL (ref 0.1–1.0)
Monocytes Relative: 14 %
Neutro Abs: 4.3 10*3/uL (ref 1.7–7.7)
Neutrophils Relative %: 67 %
Platelets: 259 10*3/uL (ref 150–400)
RBC: 3.82 MIL/uL — ABNORMAL LOW (ref 4.22–5.81)
RDW: 13.4 % (ref 11.5–15.5)
WBC: 6.3 10*3/uL (ref 4.0–10.5)
nRBC: 0 % (ref 0.0–0.2)

## 2024-03-28 LAB — TROPONIN T, HIGH SENSITIVITY
Troponin T High Sensitivity: 22 ng/L — ABNORMAL HIGH (ref 0–19)
Troponin T High Sensitivity: 20 ng/L — ABNORMAL HIGH (ref 0–19)

## 2024-03-28 LAB — PRO BRAIN NATRIURETIC PEPTIDE: Pro Brain Natriuretic Peptide: 455 pg/mL — ABNORMAL HIGH

## 2024-03-28 MED ORDER — FUROSEMIDE 10 MG/ML IJ SOLN
40.0000 mg | Freq: Once | INTRAMUSCULAR | Status: DC
  Filled 2024-03-28: qty 4

## 2024-03-28 MED ORDER — IPRATROPIUM-ALBUTEROL 0.5-2.5 (3) MG/3ML IN SOLN
3.0000 mL | Freq: Once | RESPIRATORY_TRACT | Status: AC
  Administered 2024-03-28: 3 mL via RESPIRATORY_TRACT
  Filled 2024-03-28: qty 3

## 2024-03-28 MED ORDER — ACETYLCYSTEINE 20 % IN SOLN
4.0000 mL | Freq: Once | RESPIRATORY_TRACT | Status: AC
  Administered 2024-03-28: 4 mL via RESPIRATORY_TRACT
  Filled 2024-03-28: qty 4

## 2024-03-28 MED ORDER — FUROSEMIDE 10 MG/ML IJ SOLN
20.0000 mg | Freq: Once | INTRAMUSCULAR | Status: AC
  Administered 2024-03-28: 20 mg via INTRAVENOUS
  Filled 2024-03-28: qty 4

## 2024-03-28 MED ORDER — FUROSEMIDE 20 MG PO TABS: 20.0000 mg | ORAL_TABLET | Freq: Every day | ORAL | 0 refills | Status: AC

## 2024-03-28 NOTE — Discharge Instructions (Addendum)
 Return to the emergency department today for any worsening shortness of breath, high fevers, worsening cough or any other new or concerning symptoms.

## 2024-03-28 NOTE — ED Notes (Signed)
 EDP at bedside

## 2024-03-28 NOTE — ED Triage Notes (Signed)
 Pt from home by EMS dx with flu X 2 days ago from UC. Pt c/o general malaise and intermittent chest tightness. Hypertensive with EMS, hx of hypertension and a fib. Pt alert and oriented X4, cooperative, RR even and unlabored, color WNL. Pt in NAD.

## 2024-03-28 NOTE — ED Provider Notes (Signed)
 "  [facility] Provider Note    Event Date/Time   First MD Initiated Contact with Patient 03/28/24 0901     (approximate)   History   Chest Pain   HPI  Erik Palmer is a 89 y.o. male presents to the emergency department today with concerns for continued chest congestion.  The patient states he was diagnosed with the flu 2 days ago.  He he has had symptoms for a total of 3 days.  He says that the medications that he was given at the urgent care have not helped.  He denies any recent fever.  Denies any significant chest pain.  States he does sometimes cough up some greenish phlegm.     Physical Exam   Triage Vital Signs: ED Triage Vitals  Encounter Vitals Group     BP 03/28/24 0901 (!) 181/82     Girls Systolic BP Percentile --      Girls Diastolic BP Percentile --      Boys Systolic BP Percentile --      Boys Diastolic BP Percentile --      Pulse Rate 03/28/24 0901 87     Resp 03/28/24 0901 (!) 22     Temp 03/28/24 0901 98.7 F (37.1 C)     Temp Source 03/28/24 0901 Oral     SpO2 03/28/24 0901 92 %     Weight 03/28/24 0903 240 lb 4.8 oz (109 kg)     Height 03/28/24 0903 5' 9 (1.753 m)     Head Circumference --      Peak Flow --      Pain Score 03/28/24 0903 0     Pain Loc --      Pain Education --      Exclude from Growth Chart --     Most recent vital signs: Vitals:   03/28/24 0901  BP: (!) 181/82  Pulse: 87  Resp: (!) 22  Temp: 98.7 F (37.1 C)  SpO2: 92%   General: Awake, alert, oriented. CV:  Good peripheral perfusion. Regular rate and rhythm. Resp:  Normal effort. Slight wheezing noted. Abd:  No distention.    ED Results / Procedures / Treatments   Labs (all labs ordered are listed, but only abnormal results are displayed) Labs Reviewed  CBC WITH DIFFERENTIAL/PLATELET - Abnormal; Notable for the following components:      Result Value   RBC 3.82 (*)    Hemoglobin 11.7 (*)    HCT 34.4 (*)    All other components  within normal limits  BASIC METABOLIC PANEL WITH GFR - Abnormal; Notable for the following components:   Potassium 3.4 (*)    Glucose, Bld 111 (*)    All other components within normal limits  PRO BRAIN NATRIURETIC PEPTIDE - Abnormal; Notable for the following components:   Pro Brain Natriuretic Peptide 455.0 (*)    All other components within normal limits  TROPONIN T, HIGH SENSITIVITY - Abnormal; Notable for the following components:   Troponin T High Sensitivity 22 (*)    All other components within normal limits  TROPONIN T, HIGH SENSITIVITY - Abnormal; Notable for the following components:   Troponin T High Sensitivity 20 (*)    All other components within normal limits     EKG  I, Guadalupe Eagles, attending physician, personally viewed and interpreted this EKG  EKG Time: 0903 Rate: 85 Rhythm: sinus rhythm Axis: normal Intervals: qtc 493 QRS: narrow, q waves v1, v2 ST changes:  no st elevation Impression: abnormal ekg   RADIOLOGY I independently interpreted and visualized the CXR. My interpretation: no pneumonia Radiology interpretation:  IMPRESSION:  1. Prominent interstitial markings compatible with mild edema.  2. Mild cardiomegaly.      PROCEDURES:  Critical Care performed: No   MEDICATIONS ORDERED IN ED: Medications - No data to display   IMPRESSION / MDM / ASSESSMENT AND PLAN / ED COURSE  I reviewed the triage vital signs and the nursing notes.                              Differential diagnosis includes, but is not limited to, influenza, pneumonia, ACS, anemia, electrolyte abnormality  Patient's presentation is most consistent with acute presentation with potential threat to life or bodily function.   Patient presented to emergency department today with continued symptoms after being diagnosed with pneumonia 2 days ago.  He states he has been taking the Tamiflu without any relief.  On exam patient is awake alert and oriented.  He is in no  respiratory distress.  Did obtain a chest x-ray concerning for possible pneumonia.  While this did not show any obvious pneumonia did raise some for possible edema.  BNP was checked and was found to be slightly elevated.  Additionally initial troponin was slightly elevated however was stable on repeat.  At this time I doubt ACS.  Do wonder if patient symptoms are combination of fluid overload and flu.  I discussed this with the patient.  Will plan on giving the Lasix  for the next couple of days to help with possible fluid overload.  Did discuss symptomatic treatment for the flu.     FINAL CLINICAL IMPRESSION(S) / ED DIAGNOSES   Final diagnoses:  Influenza  Other hypervolemia     Note:  This document was prepared using Dragon voice recognition software and may include unintentional dictation errors.    Floy Roberts, MD 03/28/24 1447  "

## 2024-03-28 NOTE — ED Notes (Signed)
 Pt refusing lasix  at this time. EDP notified.

## 2024-03-28 NOTE — ED Notes (Signed)
Verified correct patient and correct discharge papers given. Pt alert and oriented X 4, stable for discharge. RR even and unlabored, color WNL. Discussed discharge instructions and follow-up as directed. Discharge medications discussed, when prescribed. Pt had opportunity to ask questions, and RN available to provide patient and/or family education.   

## 2024-09-09 ENCOUNTER — Other Ambulatory Visit

## 2024-09-16 ENCOUNTER — Ambulatory Visit: Admitting: Family Medicine

## 2024-12-30 ENCOUNTER — Ambulatory Visit
